# Patient Record
Sex: Male | Born: 1935 | Race: White | Hispanic: No | Marital: Married | Smoking: Former smoker
Health system: Southern US, Community
[De-identification: ages and names within clinical notes are randomized; demographics above are authoritative.]

## PROBLEM LIST (undated history)

## (undated) DIAGNOSIS — T8859XA Other complications of anesthesia, initial encounter: Secondary | ICD-10-CM

## (undated) DIAGNOSIS — I429 Cardiomyopathy, unspecified: Secondary | ICD-10-CM

## (undated) DIAGNOSIS — I509 Heart failure, unspecified: Secondary | ICD-10-CM

## (undated) DIAGNOSIS — I10 Essential (primary) hypertension: Secondary | ICD-10-CM

## (undated) DIAGNOSIS — N35919 Unspecified urethral stricture, male, unspecified site: Secondary | ICD-10-CM

## (undated) DIAGNOSIS — G4730 Sleep apnea, unspecified: Secondary | ICD-10-CM

## (undated) DIAGNOSIS — I499 Cardiac arrhythmia, unspecified: Secondary | ICD-10-CM

## (undated) DIAGNOSIS — C801 Malignant (primary) neoplasm, unspecified: Secondary | ICD-10-CM

## (undated) DIAGNOSIS — T4145XA Adverse effect of unspecified anesthetic, initial encounter: Secondary | ICD-10-CM

## (undated) DIAGNOSIS — I428 Other cardiomyopathies: Secondary | ICD-10-CM

## (undated) DIAGNOSIS — I482 Chronic atrial fibrillation, unspecified: Secondary | ICD-10-CM

## (undated) DIAGNOSIS — I6529 Occlusion and stenosis of unspecified carotid artery: Secondary | ICD-10-CM

## (undated) DIAGNOSIS — D696 Thrombocytopenia, unspecified: Secondary | ICD-10-CM

## (undated) DIAGNOSIS — N400 Enlarged prostate without lower urinary tract symptoms: Secondary | ICD-10-CM

## (undated) DIAGNOSIS — H3500 Unspecified background retinopathy: Secondary | ICD-10-CM

## (undated) DIAGNOSIS — R2689 Other abnormalities of gait and mobility: Secondary | ICD-10-CM

## (undated) DIAGNOSIS — I2510 Atherosclerotic heart disease of native coronary artery without angina pectoris: Secondary | ICD-10-CM

## (undated) DIAGNOSIS — R351 Nocturia: Secondary | ICD-10-CM

## (undated) DIAGNOSIS — R310 Gross hematuria: Secondary | ICD-10-CM

## (undated) DIAGNOSIS — G621 Alcoholic polyneuropathy: Secondary | ICD-10-CM

## (undated) DIAGNOSIS — E785 Hyperlipidemia, unspecified: Secondary | ICD-10-CM

## (undated) DIAGNOSIS — R350 Frequency of micturition: Secondary | ICD-10-CM

## (undated) DIAGNOSIS — R0600 Dyspnea, unspecified: Secondary | ICD-10-CM

## (undated) HISTORY — DX: Alcoholic polyneuropathy: G62.1

## (undated) HISTORY — DX: Chronic atrial fibrillation, unspecified: I48.20

## (undated) HISTORY — DX: Thrombocytopenia, unspecified: D69.6

## (undated) HISTORY — PX: OTHER SURGICAL HISTORY: SHX169

## (undated) HISTORY — DX: Unspecified background retinopathy: H35.00

## (undated) HISTORY — DX: Malignant (primary) neoplasm, unspecified: C80.1

## (undated) HISTORY — PX: PARTIAL NEPHRECTOMY: SHX414

## (undated) HISTORY — DX: Nocturia: R35.1

## (undated) HISTORY — DX: Atherosclerotic heart disease of native coronary artery without angina pectoris: I25.10

## (undated) HISTORY — DX: Gross hematuria: R31.0

## (undated) HISTORY — DX: Unspecified urethral stricture, male, unspecified site: N35.919

## (undated) HISTORY — DX: Benign prostatic hyperplasia without lower urinary tract symptoms: N40.0

## (undated) HISTORY — DX: Occlusion and stenosis of unspecified carotid artery: I65.29

## (undated) HISTORY — DX: Other cardiomyopathies: I42.8

## (undated) HISTORY — DX: Frequency of micturition: R35.0

## (undated) HISTORY — PX: EYE SURGERY: SHX253

## (undated) HISTORY — PX: PROSTATE SURGERY: SHX751

## (undated) HISTORY — DX: Essential (primary) hypertension: I10

## (undated) HISTORY — PX: ROTATOR CUFF REPAIR: SHX139

---

## 2000-09-02 DIAGNOSIS — C801 Malignant (primary) neoplasm, unspecified: Secondary | ICD-10-CM

## 2000-09-02 HISTORY — DX: Malignant (primary) neoplasm, unspecified: C80.1

## 2001-09-02 HISTORY — PX: TRANSURETHRAL RESECTION OF PROSTATE: SHX73

## 2007-09-04 LAB — HM COLONOSCOPY

## 2008-09-13 ENCOUNTER — Encounter: Payer: Self-pay | Admitting: Internal Medicine

## 2008-09-20 ENCOUNTER — Encounter: Payer: Self-pay | Admitting: Internal Medicine

## 2008-12-05 ENCOUNTER — Ambulatory Visit: Payer: Self-pay | Admitting: Internal Medicine

## 2008-12-05 DIAGNOSIS — N400 Enlarged prostate without lower urinary tract symptoms: Secondary | ICD-10-CM | POA: Insufficient documentation

## 2008-12-05 DIAGNOSIS — Z860100 Personal history of colon polyps, unspecified: Secondary | ICD-10-CM | POA: Insufficient documentation

## 2008-12-05 DIAGNOSIS — I4891 Unspecified atrial fibrillation: Secondary | ICD-10-CM | POA: Insufficient documentation

## 2008-12-05 DIAGNOSIS — Z85828 Personal history of other malignant neoplasm of skin: Secondary | ICD-10-CM | POA: Insufficient documentation

## 2008-12-05 DIAGNOSIS — I10 Essential (primary) hypertension: Secondary | ICD-10-CM | POA: Insufficient documentation

## 2008-12-05 DIAGNOSIS — Z8719 Personal history of other diseases of the digestive system: Secondary | ICD-10-CM | POA: Insufficient documentation

## 2009-01-05 ENCOUNTER — Ambulatory Visit: Payer: Self-pay | Admitting: Internal Medicine

## 2009-01-05 DIAGNOSIS — G609 Hereditary and idiopathic neuropathy, unspecified: Secondary | ICD-10-CM | POA: Insufficient documentation

## 2009-01-05 LAB — CONVERTED CEMR LAB
ALT: 29 units/L (ref 0–53)
AST: 27 units/L (ref 0–37)
Albumin: 4.2 g/dL (ref 3.5–5.2)
Eosinophils Relative: 2.6 % (ref 0.0–5.0)
GFR calc non Af Amer: 48.51 mL/min (ref >60)
Glucose, Bld: 92 mg/dL (ref 70–99)
HCT: 44.7 % (ref 39.0–52.0)
Hemoglobin: 15.5 g/dL (ref 13.0–17.0)
Leukocytes, UA: NEGATIVE
Lymphs Abs: 1.4 10*3/uL (ref 0.7–4.0)
Monocytes Relative: 7 % (ref 3.0–12.0)
Neutro Abs: 4.6 10*3/uL (ref 1.4–7.7)
Nitrite: NEGATIVE
Potassium: 4.4 meq/L (ref 3.5–5.1)
RDW: 13 % (ref 11.5–14.6)
Sed Rate: 5 mm/hr (ref 0–22)
Sodium: 143 meq/L (ref 135–145)
Specific Gravity, Urine: 1.015 (ref 1.000–1.030)
TSH: 1.6 microintl units/mL (ref 0.35–5.50)
Total Protein: 6.8 g/dL (ref 6.0–8.3)
WBC: 6.7 10*3/uL (ref 4.5–10.5)
pH: 7 (ref 5.0–8.0)

## 2009-01-06 ENCOUNTER — Encounter: Payer: Self-pay | Admitting: Internal Medicine

## 2009-01-16 ENCOUNTER — Encounter: Payer: Self-pay | Admitting: Internal Medicine

## 2009-01-23 ENCOUNTER — Telehealth: Payer: Self-pay | Admitting: Internal Medicine

## 2009-02-01 ENCOUNTER — Encounter (INDEPENDENT_AMBULATORY_CARE_PROVIDER_SITE_OTHER): Payer: Self-pay | Admitting: *Deleted

## 2009-02-28 ENCOUNTER — Ambulatory Visit: Payer: Self-pay | Admitting: Internal Medicine

## 2009-02-28 LAB — CONVERTED CEMR LAB
ALT: 30 units/L (ref 0–53)
AST: 29 units/L (ref 0–37)
Albumin: 4.1 g/dL (ref 3.5–5.2)
BUN: 30 mg/dL — ABNORMAL HIGH (ref 6–23)
Basophils Relative: 0.5 % (ref 0.0–3.0)
Bilirubin Urine: NEGATIVE
Chloride: 105 meq/L (ref 96–112)
Cholesterol: 124 mg/dL (ref 0–200)
Eosinophils Relative: 3.2 % (ref 0.0–5.0)
Folate: 11.8 ng/mL
Glucose, Bld: 94 mg/dL (ref 70–99)
HCT: 45 % (ref 39.0–52.0)
Hemoglobin, Urine: NEGATIVE
Hemoglobin: 16 g/dL (ref 13.0–17.0)
Ketones, ur: NEGATIVE mg/dL
LDL Cholesterol: 72 mg/dL (ref 0–99)
Leukocytes, UA: NEGATIVE
Lymphs Abs: 1.6 10*3/uL (ref 0.7–4.0)
MCV: 90.9 fL (ref 78.0–100.0)
Monocytes Absolute: 0.4 10*3/uL (ref 0.1–1.0)
Neutro Abs: 3.7 10*3/uL (ref 1.4–7.7)
Nitrite: NEGATIVE
PSA: 0.82 ng/mL (ref 0.10–4.00)
Platelets: 127 10*3/uL — ABNORMAL LOW (ref 150.0–400.0)
Potassium: 4.6 meq/L (ref 3.5–5.1)
Sodium: 142 meq/L (ref 135–145)
Total Bilirubin: 1.4 mg/dL — ABNORMAL HIGH (ref 0.3–1.2)
Total Protein: 7.1 g/dL (ref 6.0–8.3)
Urobilinogen, UA: 0.2 (ref 0.0–1.0)
WBC: 5.9 10*3/uL (ref 4.5–10.5)

## 2009-03-01 ENCOUNTER — Encounter: Payer: Self-pay | Admitting: Internal Medicine

## 2009-03-23 ENCOUNTER — Telehealth: Payer: Self-pay | Admitting: Internal Medicine

## 2009-03-28 ENCOUNTER — Encounter: Payer: Self-pay | Admitting: Internal Medicine

## 2009-05-02 ENCOUNTER — Ambulatory Visit: Payer: Self-pay | Admitting: Internal Medicine

## 2009-05-02 DIAGNOSIS — IMO0002 Reserved for concepts with insufficient information to code with codable children: Secondary | ICD-10-CM | POA: Insufficient documentation

## 2009-05-09 ENCOUNTER — Encounter: Admission: RE | Admit: 2009-05-09 | Discharge: 2009-05-09 | Payer: Self-pay | Admitting: Internal Medicine

## 2009-05-09 ENCOUNTER — Telehealth: Payer: Self-pay | Admitting: Internal Medicine

## 2009-05-09 ENCOUNTER — Encounter: Payer: Self-pay | Admitting: Internal Medicine

## 2009-06-14 ENCOUNTER — Encounter
Admission: RE | Admit: 2009-06-14 | Discharge: 2009-06-20 | Payer: Self-pay | Admitting: Physical Medicine & Rehabilitation

## 2009-06-16 ENCOUNTER — Ambulatory Visit: Payer: Self-pay | Admitting: Physical Medicine & Rehabilitation

## 2009-07-06 ENCOUNTER — Ambulatory Visit: Payer: Self-pay | Admitting: Internal Medicine

## 2009-08-10 ENCOUNTER — Telehealth: Payer: Self-pay | Admitting: Internal Medicine

## 2010-01-05 ENCOUNTER — Ambulatory Visit: Payer: Self-pay | Admitting: Internal Medicine

## 2010-01-05 LAB — CONVERTED CEMR LAB
ALT: 25 units/L (ref 0–53)
AST: 22 units/L (ref 0–37)
Basophils Absolute: 0 10*3/uL (ref 0.0–0.1)
Calcium: 9.6 mg/dL (ref 8.4–10.5)
Chloride: 104 meq/L (ref 96–112)
Eosinophils Relative: 2.6 % (ref 0.0–5.0)
Folate: 13.5 ng/mL
Glucose, Bld: 65 mg/dL — ABNORMAL LOW (ref 70–99)
HCT: 44.1 % (ref 39.0–52.0)
Hemoglobin: 15 g/dL (ref 13.0–17.0)
Lymphs Abs: 1.6 10*3/uL (ref 0.7–4.0)
MCV: 93.1 fL (ref 78.0–100.0)
Monocytes Absolute: 0.5 10*3/uL (ref 0.1–1.0)
Monocytes Relative: 7.8 % (ref 3.0–12.0)
Neutro Abs: 4.4 10*3/uL (ref 1.4–7.7)
Platelets: 121 10*3/uL — ABNORMAL LOW (ref 150.0–400.0)
Potassium: 4.7 meq/L (ref 3.5–5.1)
RDW: 14 % (ref 11.5–14.6)
Sodium: 142 meq/L (ref 135–145)
Total Bilirubin: 0.9 mg/dL (ref 0.3–1.2)
Total Protein: 6.6 g/dL (ref 6.0–8.3)
Vitamin B-12: 344 pg/mL (ref 211–911)

## 2010-01-22 ENCOUNTER — Encounter: Payer: Self-pay | Admitting: Internal Medicine

## 2010-08-03 ENCOUNTER — Ambulatory Visit: Payer: Self-pay | Admitting: Internal Medicine

## 2010-10-02 NOTE — Assessment & Plan Note (Signed)
Summary: 6 MO ROV /NWS #   Vital Signs:  Patient profile:   77 year old male Height:      73 inches Weight:      223 pounds BMI:     29.53 O2 Sat:      98 % on Room air Temp:     97.3 degrees F oral Pulse rate:   51 / minute Pulse rhythm:   regular Resp:     16 per minute BP sitting:   124 / 72  (left arm) Cuff size:   large  Vitals Entered By: Rock Nephew CMA (Jan 05, 2010 9:23 AM)  Nutrition Counseling: Patient's BMI is greater than 25 and therefore counseled on weight management options.  O2 Flow:  Room air CC: follow-up visit, Hypertension Management Is Patient Diabetic? No Pain Assessment Patient in pain? no        Primary Care Provider:  Etta Grandchild MD  CC:  follow-up visit and Hypertension Management.  History of Present Illness: He remains concerned about N/T in his feet and he wants to do another NCS/EMG to see if his nerve function has changed any in the last year. He still drinks but says it is 1/2 of what he previously drank.  Hypertension History:      He denies headache, chest pain, palpitations, dyspnea with exertion, orthopnea, PND, peripheral edema, visual symptoms, neurologic problems, syncope, and side effects from treatment.  He notes no problems with any antihypertensive medication side effects.        Positive major cardiovascular risk factors include male age 75 years old or older, hyperlipidemia, and hypertension.  Negative major cardiovascular risk factors include no history of diabetes, negative family history for ischemic heart disease, and non-tobacco-user status.        Positive history for target organ damage include cardiac end organ damage (either CHF or LVH).  Further assessment for target organ damage reveals no history of ASHD, stroke/TIA, peripheral vascular disease, renal insufficiency, or hypertensive retinopathy.     Preventive Screening-Counseling & Management  Alcohol-Tobacco     Alcohol drinks/day: <1     Alcohol type:  spirits     >5/day in last 3 mos: no     Alcohol Counseling: to STOP drinking     Feels need to cut down: yes     Feels annoyed by complaints: yes     Feels guilty re: drinking: no     Needs 'eye opener' in am: no     Smoking Status: never  Hep-HIV-STD-Contraception     Hepatitis Risk: no risk noted     HIV Risk: no risk noted     STD Risk: no risk noted      Sexual History:  currently monogamous.        Drug Use:  never.        Blood Transfusions:  no.    Clinical Review Panels:  Prevention   Last Colonoscopy:  Adenomatous Polyp (08/31/2007)   Last PSA:  0.82 (02/28/2009)  Immunizations   Last Tetanus Booster:  Td (12/16/2001)   Last Pneumovax:  Pneumovax (12/05/2004)  Lipid Management   Cholesterol:  124 (02/28/2009)   LDL (bad choesterol):  72 (02/28/2009)   HDL (good cholesterol):  37.00 (02/28/2009)  Diabetes Management   HgBA1C:  5.4 (01/05/2009)   Creatinine:  1.3 (02/28/2009)   Last Pneumovax:  Pneumovax (12/05/2004)  CBC   WBC:  5.9 (02/28/2009)   RBC:  4.95 (02/28/2009)   Hgb:  16.0 (02/28/2009)   Hct:  45.0 (02/28/2009)   Platelets:  127.0 (02/28/2009)   MCV  90.9 (02/28/2009)   MCHC  35.5 (02/28/2009)   RDW  12.5 (02/28/2009)   PMN:  63.7 (02/28/2009)   Lymphs:  26.6 (02/28/2009)   Monos:  6.0 (02/28/2009)   Eosinophils:  3.2 (02/28/2009)   Basophil:  0.5 (02/28/2009)  Complete Metabolic Panel   Glucose:  94 (02/28/2009)   Sodium:  142 (02/28/2009)   Potassium:  4.6 (02/28/2009)   Chloride:  105 (02/28/2009)   CO2:  31 (02/28/2009)   BUN:  30 (02/28/2009)   Creatinine:  1.3 (02/28/2009)   Albumin:  4.1 (02/28/2009)   Total Protein:  7.1 (02/28/2009)   Calcium:  9.5 (02/28/2009)   Total Bili:  1.4 (02/28/2009)   Alk Phos:  63 (02/28/2009)   SGPT (ALT):  30 (02/28/2009)   SGOT (AST):  29 (02/28/2009)   Medications Prior to Update: 1)  Digoxin 0.125 Mg Tabs (Digoxin) 2)  Toprol Xl 50 Mg Xr24h-Tab (Metoprolol Succinate) .... Take 1  Tablet By Mouth Once A Day 3)  Warfarin Sodium 2.5 Mg Tabs (Warfarin Sodium) .... 2.5mg  5 X Weekly and 5mg  2 X Weekly 4)  Simvastatin 80 Mg Tabs (Simvastatin) .... Take 1 Tablet By Mouth Once A Day 5)  Glucosamine-Chondroitin 1500-1200 Mg/90ml Liqd (Glucosamine-Chondroitin) .... 2 Qd 6)  Hyzaar 100-12.5 Mg Tabs (Losartan Potassium-Hctz) .... Once Daily For High Blood Pressure  Current Medications (verified): 1)  Digoxin 0.125 Mg Tabs (Digoxin) 2)  Toprol Xl 50 Mg Xr24h-Tab (Metoprolol Succinate) .... Take 1 Tablet By Mouth Once A Day 3)  Warfarin Sodium 2.5 Mg Tabs (Warfarin Sodium) .... 2.5mg  5 X Weekly and 5mg  2 X Weekly 4)  Simvastatin 80 Mg Tabs (Simvastatin) .... Take 1 Tablet By Mouth Once A Day 5)  Hyzaar 100-12.5 Mg Tabs (Losartan Potassium-Hctz) .... Once Daily For High Blood Pressure  Allergies (verified): 1)  ! Ace Inhibitors  Past History:  Past Medical History: Reviewed history from 12/05/2008 and no changes required. Anticoagulation therapy Atrial fibrillation Skin cancer, hx of Hypertension Benign prostatic hypertrophy Colonic polyps, hx of Diverticulitis, hx of  Past Surgical History: Reviewed history from 12/05/2008 and no changes required. Nephrectomy- left/partial 2002 Transurethral resection of prostate- 2003 Tonsillectomy-1940  Family History: Reviewed history from 12/05/2008 and no changes required. Family History Diabetes 1st degree relative Family History Hypertension Family History of Cardiovascular disorder  Social History: Reviewed history from 02/28/2009 and no changes required. Retired Married Drug use-no Regular exercise-yes Alcohol use-yes  Review of Systems  The patient denies vision loss, abdominal pain, melena, hematochezia, severe indigestion/heartburn, hematuria, difficulty walking, and angioedema.   CV:  Denies chest pain or discomfort, difficulty breathing at night, fainting, fatigue, lightheadness, near fainting, palpitations,  shortness of breath with exertion, swelling of feet, and weight gain. Neuro:  Complains of numbness and tingling; denies brief paralysis, difficulty with concentration, disturbances in coordination, falling down, headaches, poor balance, seizures, and weakness.  Physical Exam  General:  alert, well-developed, well-nourished, and well-hydrated.   Head:  Normocephalic and atraumatic without obvious abnormalities. No apparent alopecia or balding. Eyes:  No corneal or conjunctival inflammation noted. EOMI. Perrla. Funduscopic exam benign, without hemorrhages, exudates or papilledema. Vision grossly normal. Mouth:  Oral mucosa and oropharynx without lesions or exudates.  Teeth in good repair. Neck:  supple, full ROM, no masses, and no thyromegaly.   Lungs:  Normal respiratory effort, chest expands symmetrically. Lungs are clear to auscultation, no crackles or wheezes.  Heart:  no murmur, no gallop, no rub, and irregular rhythm.   Abdomen:  soft, non-tender, normal bowel sounds, no distention, no masses, no guarding, no rigidity, no hepatomegaly, and no splenomegaly.   Msk:  normal ROM, no joint tenderness, no joint swelling, no joint warmth, no redness over joints, no joint deformities, no joint instability, and no crepitation.   Pulses:  R and L carotid,radial,femoral,dorsalis pedis and posterior tibial pulses are full and equal bilaterally Extremities:  trace left pedal edema and trace right pedal edema.   Neurologic:  his gait shows that his feet rotate externally  but he is not ataxic.alert & oriented X3, cranial nerves II-XII intact, strength normal in all extremities, RLE hyporeflexia, RLE sensory loss, LLE hyporeflexia, LLE sensory loss, decreased sensation to PP, and decreased sensation to LT.   Skin:  turgor normal, color normal, no rashes, no suspicious lesions, no ecchymoses, no petechiae, no purpura, excessive tan, and solar damage.   Cervical Nodes:  no anterior cervical adenopathy and no  posterior cervical adenopathy.   Axillary Nodes:  no R axillary adenopathy and no L axillary adenopathy.   Psych:  Cognition and judgment appear intact. Alert and cooperative with normal attention span and concentration. No apparent delusions, illusions, hallucinations   Impression & Recommendations:  Problem # 1:  PERIPHERAL NEUROPATHY (ICD-356.9) Assessment Unchanged I still believe his EtOH intake is a big problem with this but he is NOT willing to abstain from alcohol intake. Orders: Venipuncture (16109) TLB-BMP (Basic Metabolic Panel-BMET) (80048-METABOL) TLB-CBC Platelet - w/Differential (85025-CBCD) TLB-Hepatic/Liver Function Pnl (80076-HEPATIC) TLB-B12 + Folate Pnl (60454_09811-B14/NWG) TLB-TSH (Thyroid Stimulating Hormone) (84443-TSH) TLB-Digoxin (Lanoxin) (80162-DIG) Neurology Referral (Neuro)  Problem # 2:  HYPERTENSION (ICD-401.9) Assessment: Improved  His updated medication list for this problem includes:    Toprol Xl 50 Mg Xr24h-tab (Metoprolol succinate) .Marland Kitchen... Take 1 tablet by mouth once a day    Hyzaar 100-12.5 Mg Tabs (Losartan potassium-hctz) ..... Once daily for high blood pressure  Orders: Venipuncture (95621) TLB-BMP (Basic Metabolic Panel-BMET) (80048-METABOL) TLB-CBC Platelet - w/Differential (85025-CBCD) TLB-Hepatic/Liver Function Pnl (80076-HEPATIC) TLB-B12 + Folate Pnl (30865_78469-G29/BMW) TLB-TSH (Thyroid Stimulating Hormone) (84443-TSH) TLB-Digoxin (Lanoxin) (80162-DIG)  BP today: 124/72 Prior BP: 118/82 (07/06/2009)  Prior 10 Yr Risk Heart Disease: 11 % (05/02/2009)  Labs Reviewed: K+: 4.6 (02/28/2009) Creat: : 1.3 (02/28/2009)   Chol: 124 (02/28/2009)   HDL: 37.00 (02/28/2009)   LDL: 72 (02/28/2009)   TG: 77.0 (02/28/2009)  Problem # 3:  ATRIAL FIBRILLATION (ICD-427.31) Assessment: Unchanged  His updated medication list for this problem includes:    Digoxin 0.125 Mg Tabs (Digoxin)    Toprol Xl 50 Mg Xr24h-tab (Metoprolol succinate)  .Marland Kitchen... Take 1 tablet by mouth once a day    Warfarin Sodium 2.5 Mg Tabs (Warfarin sodium) .Marland Kitchen... 2.5mg  5 x weekly and 5mg  2 x weekly  Orders: Venipuncture (41324) TLB-BMP (Basic Metabolic Panel-BMET) (80048-METABOL) TLB-CBC Platelet - w/Differential (85025-CBCD) TLB-Hepatic/Liver Function Pnl (80076-HEPATIC) TLB-B12 + Folate Pnl (40102_72536-U44/IHK) TLB-TSH (Thyroid Stimulating Hormone) (84443-TSH) TLB-Digoxin (Lanoxin) (80162-DIG)  Complete Medication List: 1)  Digoxin 0.125 Mg Tabs (Digoxin) 2)  Toprol Xl 50 Mg Xr24h-tab (Metoprolol succinate) .... Take 1 tablet by mouth once a day 3)  Warfarin Sodium 2.5 Mg Tabs (Warfarin sodium) .... 2.5mg  5 x weekly and 5mg  2 x weekly 4)  Simvastatin 80 Mg Tabs (Simvastatin) .... Take 1 tablet by mouth once a day 5)  Hyzaar 100-12.5 Mg Tabs (Losartan potassium-hctz) .... Once daily for high blood pressure  Hypertension Assessment/Plan:  The patient's hypertensive risk group is category C: Target organ damage and/or diabetes.  His calculated 10 year risk of coronary heart disease is 11 %.  Today's blood pressure is 124/72.  His blood pressure goal is < 140/90.  Patient Instructions: 1)  Please schedule a follow-up appointment in 6 months. 2)  It is important that you exercise regularly at least 20 minutes 5 times a week. If you develop chest pain, have severe difficulty breathing, or feel very tired , stop exercising immediately and seek medical attention. 3)  You need to lose weight. Consider a lower calorie diet and regular exercise.  4)  Take 650-1000mg  of Tylenol every 4-6 hours as needed for relief of pain or comfort of fever AVOID taking more than 4000mg   in a 24 hour period (can cause liver damage in higher doses).   Not Administered:    Influenza Vaccine not given due to: vaccine availability

## 2011-02-12 ENCOUNTER — Encounter: Payer: Self-pay | Admitting: Internal Medicine

## 2011-02-26 LAB — PROTIME-INR

## 2011-03-03 ENCOUNTER — Encounter: Payer: Self-pay | Admitting: Internal Medicine

## 2011-03-20 ENCOUNTER — Ambulatory Visit: Payer: Self-pay | Admitting: Internal Medicine

## 2011-04-03 ENCOUNTER — Encounter: Payer: Self-pay | Admitting: Internal Medicine

## 2011-04-11 ENCOUNTER — Encounter: Payer: Self-pay | Admitting: Internal Medicine

## 2011-04-22 ENCOUNTER — Telehealth: Payer: Self-pay | Admitting: Internal Medicine

## 2011-04-22 ENCOUNTER — Ambulatory Visit (INDEPENDENT_AMBULATORY_CARE_PROVIDER_SITE_OTHER): Payer: Medicare Other | Admitting: Internal Medicine

## 2011-04-22 DIAGNOSIS — I4891 Unspecified atrial fibrillation: Secondary | ICD-10-CM

## 2011-04-22 LAB — PROTIME-INR: Prothrombin Time: 22.5 seconds — ABNORMAL HIGH (ref 11.6–15.2)

## 2011-04-22 MED ORDER — WARFARIN SODIUM 2.5 MG PO TABS: ORAL_TABLET | ORAL | Status: DC

## 2011-04-22 NOTE — Telephone Encounter (Signed)
Patient is requesting refill, printed Rx for patient pick-up.  Pending signature.

## 2011-04-22 NOTE — Telephone Encounter (Signed)
Patient notified, and will pick up Rx.

## 2011-04-23 NOTE — Progress Notes (Signed)
  Subjective:    Patient ID: Kyle Gutierrez, male    DOB: Jul 08, 1932, 77 y.o.   MRN: [id number]  HPI Patient is here for a PT/INR check (lab draw only).   Review of Systems     Objective:   Physical Exam        Assessment & Plan:

## 2011-04-24 ENCOUNTER — Other Ambulatory Visit: Payer: Self-pay | Admitting: Internal Medicine

## 2011-04-24 MED ORDER — METOPROLOL SUCCINATE ER 25 MG PO TB24: 25.0000 mg | ORAL_TABLET | Freq: Every day | ORAL | Status: DC

## 2011-05-21 ENCOUNTER — Other Ambulatory Visit (INDEPENDENT_AMBULATORY_CARE_PROVIDER_SITE_OTHER): Payer: Medicare Other | Admitting: *Deleted

## 2011-05-21 DIAGNOSIS — Z7901 Long term (current) use of anticoagulants: Secondary | ICD-10-CM

## 2011-05-21 LAB — PROTIME-INR: INR: 1.9 ratio — ABNORMAL HIGH (ref 0.8–1.0)

## 2011-05-21 NOTE — Progress Notes (Signed)
Addended by: Jobie Quaker on: 05/21/2011 02:09 PM   Modules accepted: Orders

## 2011-06-18 ENCOUNTER — Ambulatory Visit (INDEPENDENT_AMBULATORY_CARE_PROVIDER_SITE_OTHER): Payer: Medicare Other | Admitting: *Deleted

## 2011-06-18 ENCOUNTER — Other Ambulatory Visit (INDEPENDENT_AMBULATORY_CARE_PROVIDER_SITE_OTHER): Payer: Medicare Other | Admitting: *Deleted

## 2011-06-18 DIAGNOSIS — Z23 Encounter for immunization: Secondary | ICD-10-CM

## 2011-06-18 DIAGNOSIS — Z7901 Long term (current) use of anticoagulants: Secondary | ICD-10-CM

## 2011-06-18 NOTE — Progress Notes (Signed)
Addended by: Jobie Quaker on: 06/18/2011 04:22 PM   Modules accepted: Orders

## 2011-06-19 LAB — PROTIME-INR
INR: 1.7 — ABNORMAL HIGH (ref <1.50)
Prothrombin Time: 20.6 seconds — ABNORMAL HIGH (ref 11.6–15.2)

## 2011-07-16 ENCOUNTER — Telehealth: Payer: Self-pay | Admitting: Internal Medicine

## 2011-07-16 ENCOUNTER — Other Ambulatory Visit (INDEPENDENT_AMBULATORY_CARE_PROVIDER_SITE_OTHER): Payer: Medicare Other | Admitting: *Deleted

## 2011-07-16 DIAGNOSIS — Z7901 Long term (current) use of anticoagulants: Secondary | ICD-10-CM

## 2011-07-16 LAB — PROTIME-INR
INR: 2.3 ratio — ABNORMAL HIGH (ref 0.8–1.0)
Prothrombin Time: 25.9 s — ABNORMAL HIGH (ref 10.2–12.4)

## 2011-07-17 NOTE — Telephone Encounter (Signed)
Standing order printed for Dr. Darrick Huntsman signature. Will call patient to let him know that it has been done once she signs it. Also need to ask him which [facility] this will be going to.

## 2011-08-06 ENCOUNTER — Ambulatory Visit (INDEPENDENT_AMBULATORY_CARE_PROVIDER_SITE_OTHER): Payer: Medicare Other | Admitting: Internal Medicine

## 2011-08-06 ENCOUNTER — Encounter: Payer: Self-pay | Admitting: Internal Medicine

## 2011-08-06 DIAGNOSIS — E785 Hyperlipidemia, unspecified: Secondary | ICD-10-CM

## 2011-08-06 DIAGNOSIS — Z79899 Other long term (current) drug therapy: Secondary | ICD-10-CM

## 2011-08-06 DIAGNOSIS — Z7901 Long term (current) use of anticoagulants: Secondary | ICD-10-CM

## 2011-08-06 DIAGNOSIS — D696 Thrombocytopenia, unspecified: Secondary | ICD-10-CM

## 2011-08-06 DIAGNOSIS — I739 Peripheral vascular disease, unspecified: Secondary | ICD-10-CM | POA: Insufficient documentation

## 2011-08-06 DIAGNOSIS — G609 Hereditary and idiopathic neuropathy, unspecified: Secondary | ICD-10-CM

## 2011-08-06 DIAGNOSIS — A5211 Tabes dorsalis: Secondary | ICD-10-CM

## 2011-08-06 DIAGNOSIS — I10 Essential (primary) hypertension: Secondary | ICD-10-CM

## 2011-08-06 DIAGNOSIS — G589 Mononeuropathy, unspecified: Secondary | ICD-10-CM

## 2011-08-06 DIAGNOSIS — Z23 Encounter for immunization: Secondary | ICD-10-CM

## 2011-08-06 DIAGNOSIS — G629 Polyneuropathy, unspecified: Secondary | ICD-10-CM

## 2011-08-06 LAB — COMPREHENSIVE METABOLIC PANEL
Albumin: 4.1 g/dL (ref 3.5–5.2)
Alkaline Phosphatase: 64 U/L (ref 39–117)
BUN: 27 mg/dL — ABNORMAL HIGH (ref 6–23)
CO2: 28 mEq/L (ref 19–32)
Calcium: 9.3 mg/dL (ref 8.4–10.5)
Chloride: 109 mEq/L (ref 96–112)
GFR: 53.49 mL/min — ABNORMAL LOW (ref >60.00)
Glucose, Bld: 99 mg/dL (ref 70–99)
Potassium: 4.1 mEq/L (ref 3.5–5.1)
Sodium: 145 mEq/L (ref 135–145)
Total Protein: 6.6 g/dL (ref 6.0–8.3)

## 2011-08-06 LAB — CBC WITH DIFFERENTIAL/PLATELET
Eosinophils Relative: 2.6 % (ref 0.0–5.0)
Lymphocytes Relative: 22.1 % (ref 12.0–46.0)
MCV: 94.1 fl (ref 78.0–100.0)
Monocytes Absolute: 0.4 10*3/uL (ref 0.1–1.0)
Neutrophils Relative %: 67.3 % (ref 43.0–77.0)
Platelets: 111 10*3/uL — ABNORMAL LOW (ref 150.0–400.0)
RBC: 4.5 Mil/uL (ref 4.22–5.81)
WBC: 5.8 10*3/uL (ref 4.5–10.5)

## 2011-08-06 LAB — PROTIME-INR
INR: 2.5 ratio — ABNORMAL HIGH (ref 0.8–1.0)
Prothrombin Time: 27.5 s — ABNORMAL HIGH (ref 10.2–12.4)

## 2011-08-06 LAB — LIPID PANEL: Cholesterol: 142 mg/dL (ref 0–200)

## 2011-08-06 NOTE — Progress Notes (Signed)
  Subjective:    Patient ID: Kyle Gutierrez, male    DOB: November 09, 1931, 77 y.o.   MRN: [id number]  HPI 77 yo white male with history of atrial fibrillation. Seen recently by Dr. Gwen Pounds  And told he had CAD. Patient upset that he had not been told this before.  No history of AMI, no prior cardiac cath.  After extended discussion it seems that carotid dopplers were done in office and noted <50% stenosis  And the conclusion was drawn that he had CAD without explaining to patient the rationale for making this assumption.  Patient denies chest pain, claudication, exertional, dyspnea.  Had ECHO done .      Review of Systems  Constitutional: Negative for fever, chills, diaphoresis, activity change, appetite change, fatigue and unexpected weight change.  HENT: Negative for hearing loss, ear pain, nosebleeds, congestion, sore throat, facial swelling, rhinorrhea, sneezing, drooling, mouth sores, trouble swallowing, neck pain, neck stiffness, dental problem, voice change, postnasal drip, sinus pressure, tinnitus and ear discharge.   Eyes: Negative for photophobia, pain, discharge, redness, itching and visual disturbance.  Respiratory: Negative for apnea, cough, choking, chest tightness, shortness of breath, wheezing and stridor.   Cardiovascular: Negative for chest pain, palpitations and leg swelling.  Gastrointestinal: Negative for nausea, vomiting, abdominal pain, diarrhea, constipation, blood in stool, abdominal distention, anal bleeding and rectal pain.  Genitourinary: Negative for dysuria, urgency, frequency, hematuria, flank pain, decreased urine volume, scrotal swelling, difficulty urinating and testicular pain.  Musculoskeletal: Negative for myalgias, back pain, joint swelling, arthralgias and gait problem.  Skin: Negative for color change, rash and wound.  Neurological: Negative for dizziness, tremors, seizures, syncope, speech difficulty, weakness, light-headedness, numbness and headaches.    Psychiatric/Behavioral: Negative for suicidal ideas, hallucinations, behavioral problems, confusion, sleep disturbance, dysphoric mood, decreased concentration and agitation. The patient is not nervous/anxious.        Objective:   Physical Exam  Constitutional: He is oriented to person, place, and time.  HENT:  Head: Normocephalic and atraumatic.  Mouth/Throat: Oropharynx is clear and moist.  Eyes: Conjunctivae and EOM are normal.  Neck: Normal range of motion. Neck supple. No JVD present. No thyromegaly present.  Cardiovascular: Normal rate, regular rhythm and normal heart sounds.   Pulmonary/Chest: Effort normal and breath sounds normal. He has no wheezes. He has no rales.  Abdominal: Soft. Bowel sounds are normal. He exhibits no mass. There is no tenderness. There is no rebound.  Musculoskeletal: Normal range of motion. He exhibits no edema.  Neurological: He is alert and oriented to person, place, and time.  Skin: Skin is warm and dry.  Psychiatric: He has a normal mood and affect.          Assessment & Plan:

## 2011-08-06 NOTE — Patient Instructions (Addendum)
Peripheral Vascular Disease  Peripheral Vascular Disease (PVD), also called Peripheral Arterial Disease (PAD), is a circulation problem caused by cholesterol (atherosclerotic plaque) deposits in the arteries. PVD commonly occurs in the lower extremities (legs) but it can occur in other areas of the body, such as your arms. The cholesterol buildup in the arteries reduces blood flow which can cause pain and other serious problems. The presence of PVD can place a person at risk for Coronary Artery Disease (CAD).   CAUSES   Causes of PVD can be many. It is usually associated with more than one risk factor such as:    High Cholesterol.   Smoking.   Diabetes.   Lack of exercise or inactivity.   High blood pressure (hypertension).   Obesity.   Family history.  SYMPTOMS    When the lower extremities are affected, patients with PVD may experience:   Leg pain with exertion or physical activity. This is called INTERMITTENT CLAUDICATION. This may present as cramping or numbness with physical activity. The location of the pain is associated with the level of blockage. For example, blockage at the abdominal level (distal abdominal aorta) may result in buttock or hip pain. Lower leg arterial blockage may result in calf pain.   As PVD becomes more severe, pain can develop with less physical activity.   In people with severe PVD, leg pain may occur at rest.   Other PVD signs and symptoms:   Leg numbness or weakness.   Coldness in the affected leg or foot, especially when compared to the other leg.   A change in leg color.   Patients with significant PVD are more prone to ulcers or sores on toes, feet or legs. These may take longer to heal or may reoccur. The ulcers or sores can become infected.   If signs and symptoms of PVD are ignored, gangrene may occur. This can result in the loss of toes or loss of an entire limb.   Not all leg pain is related to PVD. Other medical conditions can cause leg pain such  as:   Blood clots (embolism) or Deep Vein Thrombosis.   Inflammation of the blood vessels (vasculitis).   Spinal stenosis.  DIAGNOSIS   Diagnosis of PVD can involve several different types of tests. These can include:   Pulse Volume Recording Method (PVR). This test is simple, painless and does not involve the use of X-rays. PVR involves measuring and comparing the blood pressure in the arms and legs. An ABI (Ankle-Brachial Index) is calculated. The normal ratio of blood pressures is 1. As this number becomes smaller, it indicates more severe disease.   < 0.95 - indicates significant narrowing in one or more leg vessels.   <0.8 - there will usually be pain in the foot, leg or buttock with exercise.   <0.4 - will usually have pain in the legs at rest.   <0.25 - usually indicates limb threatening PVD.   Doppler detection of pulses in the legs. This test is painless and checks to see if you have a pulses in your legs/feet.   A dye or contrast material (a substance that highlights the blood vessels so they show up on x-ray) may be given to help your caregiver better see the arteries for the following tests. The dye is eliminated from your body by the kidney's. Your caregiver may order blood work to check your kidney function and other laboratory values before the following tests are performed:   Magnetic Resonance   study of the blood vessels and arteries. The MRA machine uses a large magnet to produce images of the blood vessels.   Computed Tomography Angiography (CTA). A CTA is a specialized x-ray that looks at how the blood flows in your blood vessels. An IV may be inserted into your arm so contrast dye can be injected.   Angiogram. Is a procedure that uses x-rays to look at your blood vessels. This procedure is minimally invasive, meaning a small incision (cut) is made in your groin. A small tube (catheter) is then inserted into the artery of  your groin. The catheter is guided to the blood vessel or artery your caregiver wants to examine. Contrast dye is injected into the catheter. X-rays are then taken of the blood vessel or artery. After the images are obtained, the catheter is taken out.  TREATMENT  Treatment of PVD involves many interventions which may include:  Lifestyle changes:   Quitting smoking.   Exercise.   Following a low fat, low cholesterol diet.   Control of diabetes.   Foot care is very important to the PVD patient. Good foot care can help prevent infection.   Medication:   Cholesterol-lowering medicine.   Blood pressure medicine.   Anti-platelet drugs.   Certain medicines may reduce symptoms of Intermittent Claudication.   Interventional/Surgical options:   Angioplasty. An Angioplasty is a procedure that inflates a balloon in the blocked artery. This opens the blocked artery to improve blood flow.   Stent Implant. A wire mesh tube (stent) is placed in the artery. The stent expands and stays in place, allowing the artery to remain open.   Peripheral Bypass Surgery. This is a surgical procedure that reroutes the blood around a blocked artery to help improve blood flow. This type of procedure may be performed if Angioplasty or stent implants are not an option.  SEEK IMMEDIATE MEDICAL CARE IF:   You develop pain or numbness in your arms or legs.   Your arm or leg turns cold, becomes blue in color.   You develop redness, warmth, swelling and pain in your arms or legs.  MAKE SURE YOU:   Understand these instructions.   Will watch your condition.   Will get help right away if you are not doing well or get worse.  Document Released: 09/26/2004 Document Revised: 05/01/2011 Document Reviewed: 08/23/2008 [facility] Patient Information 2012 [place], [place].

## 2011-08-07 ENCOUNTER — Encounter: Payer: Self-pay | Admitting: Internal Medicine

## 2011-08-07 DIAGNOSIS — F1910 Other psychoactive substance abuse, uncomplicated: Secondary | ICD-10-CM | POA: Insufficient documentation

## 2011-08-07 DIAGNOSIS — I10 Essential (primary) hypertension: Secondary | ICD-10-CM | POA: Insufficient documentation

## 2011-08-07 DIAGNOSIS — G621 Alcoholic polyneuropathy: Secondary | ICD-10-CM | POA: Insufficient documentation

## 2011-08-07 DIAGNOSIS — D696 Thrombocytopenia, unspecified: Secondary | ICD-10-CM | POA: Insufficient documentation

## 2011-08-07 NOTE — Assessment & Plan Note (Addendum)
Apparently found with carotid dopplers done in [facility] office.  Questions answsered. Reports requested.  cholestlerol is under excellent control and he is anticoagulated with coumadin for atrial fib.

## 2011-08-07 NOTE — Assessment & Plan Note (Signed)
Controlled on current regimen. No changes today

## 2011-08-07 NOTE — Assessment & Plan Note (Signed)
Chronic, with slow progression.  Prior neurologic evaluation opined taht it was due to alcohol overuse.

## 2011-08-07 NOTE — Assessment & Plan Note (Signed)
Chronic, stable. No further workup per patient

## 2011-08-08 ENCOUNTER — Telehealth: Payer: Self-pay | Admitting: Internal Medicine

## 2011-08-08 ENCOUNTER — Other Ambulatory Visit: Payer: Self-pay | Admitting: Internal Medicine

## 2011-08-08 DIAGNOSIS — J069 Acute upper respiratory infection, unspecified: Secondary | ICD-10-CM

## 2011-08-08 MED ORDER — DOXYCYCLINE HYCLATE 100 MG PO TABS: 100.0000 mg | ORAL_TABLET | Freq: Two times a day (BID) | ORAL | Status: AC

## 2011-08-08 MED ORDER — ZILEUTON ER 600 MG PO TB12: 600.0000 mg | ORAL_TABLET | Freq: Two times a day (BID) | ORAL | Status: DC

## 2011-08-08 NOTE — Telephone Encounter (Signed)
Pt called wanted to be seen today  He has congestion/sore throat coughing/no fever   [facility]

## 2011-08-08 NOTE — Telephone Encounter (Signed)
Patient called back and he was very upset that he hasn't gotten a call back in regards to the previous phone note. I explained to him that Morrie Sheldon and Dr. Darrick Huntsman have been in clinic all day, I offered him a 10:30 appointment for in the morning, however he wanted to know if he really needed to waste Dr. Melina Schools time by coming in if she could just call him in something. He will be calling us back at 4:45 to check on this status.  Please advise.

## 2011-08-08 NOTE — Telephone Encounter (Signed)
He was just seen on the 4th and had the same symptoms,  He does not need to be seen again.  Since he has not imporved, Please call him in doxycyline 100 mg twice daily with food #14 .   Tessalon 200 mg one tablet every 8 hours prn cough  #45

## 2011-08-09 NOTE — Telephone Encounter (Signed)
Dr. Darrick Huntsman has already spoken to patient

## 2011-08-13 ENCOUNTER — Ambulatory Visit: Payer: Self-pay | Admitting: Urology

## 2011-08-16 ENCOUNTER — Telehealth: Payer: Self-pay | Admitting: Internal Medicine

## 2011-08-16 DIAGNOSIS — R911 Solitary pulmonary nodule: Secondary | ICD-10-CM | POA: Insufficient documentation

## 2011-08-16 NOTE — Telephone Encounter (Signed)
Please tell patient that I have seen the results of his CT scan and no preference of either [facility] or [facility], because [facility] has an excellent cancer center.  If he wants to go to a tertiary care center I am happy to refer him to either one .

## 2011-08-16 NOTE — Telephone Encounter (Signed)
[phone] Pt walked in wanting to talk to you about his ct results he had done @ [facility] for dr Lonna Cobb.  Dr Heywood Footman office has made him an appointment with the [facility]  Cancer center 08/23/11. He wanted to get your input on wheither he should go to [facility] or [facility].  I made a copy of those reports for you

## 2011-08-19 NOTE — Telephone Encounter (Signed)
Pt has appointment at [facility] on Friday, pt states he will either go to [facility] or [facility] and wonders if he should keep appointment at [facility] on Friday. Pt states he will need referral to go strait to [facility]

## 2011-08-19 NOTE — Telephone Encounter (Signed)
Patient notified as instructed by telephone. 

## 2011-08-19 NOTE — Telephone Encounter (Signed)
I would keep the appt at [facility] because it is doubtful that I can get an appt that quickly at [facility]

## 2011-08-19 NOTE — Telephone Encounter (Signed)
Left message on machine to call back  

## 2011-08-22 ENCOUNTER — Ambulatory Visit: Payer: Self-pay | Admitting: Oncology

## 2011-08-23 ENCOUNTER — Telehealth: Payer: Self-pay | Admitting: *Deleted

## 2011-08-23 ENCOUNTER — Ambulatory Visit (INDEPENDENT_AMBULATORY_CARE_PROVIDER_SITE_OTHER): Payer: Medicare Other | Admitting: Internal Medicine

## 2011-08-23 ENCOUNTER — Ambulatory Visit (INDEPENDENT_AMBULATORY_CARE_PROVIDER_SITE_OTHER)
Admission: RE | Admit: 2011-08-23 | Discharge: 2011-08-23 | Disposition: A | Discharge status: Home / Self Care | Payer: Medicare Other | Source: Ambulatory Visit | Attending: Internal Medicine | Admitting: Internal Medicine

## 2011-08-23 VITALS — BP 152/82 | HR 73 | Temp 98.1°F | Ht 74.0 in | Wt 197.0 lb

## 2011-08-23 DIAGNOSIS — R6883 Chills (without fever): Secondary | ICD-10-CM

## 2011-08-23 DIAGNOSIS — R059 Cough, unspecified: Secondary | ICD-10-CM

## 2011-08-23 DIAGNOSIS — J069 Acute upper respiratory infection, unspecified: Secondary | ICD-10-CM

## 2011-08-23 DIAGNOSIS — J40 Bronchitis, not specified as acute or chronic: Secondary | ICD-10-CM

## 2011-08-23 DIAGNOSIS — R05 Cough: Secondary | ICD-10-CM

## 2011-08-23 LAB — POCT INFLUENZA A/B: Influenza A, POC: NEGATIVE

## 2011-08-23 NOTE — Telephone Encounter (Signed)
Patient called back and is going to see Dr. Dan Humphreys today.

## 2011-08-23 NOTE — Progress Notes (Signed)
Subjective:    Patient ID: Kyle Gutierrez, male    DOB: 11/12/1931, 77 y.o.   MRN: [id number]  HPI 76YO male with h/o atrial fibrillation presents with approximate 3 day h/o nasal congestion, cough, chills. Cough is non-productive. No fever. Denies any shortness of breath or chest pain.  Was seen at urgent care yesterday, started on Levaquin and codeine for cough. Reports not feeling any better this morning and concerned about whether or not treatment was appropriate.  Outpatient Encounter Prescriptions as of 08/23/2011  Medication Sig Dispense Refill  . digoxin (LANOXIN) 0.125 MG tablet Take 125 mcg by mouth daily.        Marland Kitchen losartan-hydrochlorothiazide (HYZAAR) 100-12.5 MG per tablet Take 1 tablet by mouth daily.        . metoprolol succinate (TOPROL XL) 25 MG 24 hr tablet Take 1 tablet (25 mg total) by mouth daily.  90 tablet  3  . simvastatin (ZOCOR) 40 MG tablet Take 40 mg by mouth at bedtime.        Marland Kitchen warfarin (COUMADIN) 2.5 MG tablet Take two tablets (5 mg) on Sun, Mon, Wed, Fri.  One tablet (2.5 mg) on Tues, Thurs, Sat.  Or as directed.       . zileuton (ZYFLO CR) 600 MG CR tablet Take 1 tablet (600 mg total) by mouth 2 (two) times daily.  40 tablet  1    Review of Systems  Constitutional: Positive for chills. Negative for fever, activity change and fatigue.  HENT: Positive for congestion, rhinorrhea, postnasal drip and sinus pressure. Negative for hearing loss, ear pain, nosebleeds, sore throat, sneezing, trouble swallowing, neck pain, neck stiffness, voice change, tinnitus and ear discharge.   Eyes: Negative for discharge, redness, itching and visual disturbance.  Respiratory: Positive for cough. Negative for chest tightness, shortness of breath, wheezing and stridor.   Cardiovascular: Negative for chest pain and leg swelling.  Musculoskeletal: Negative for myalgias and arthralgias.  Skin: Negative for color change and rash.  Neurological: Negative for dizziness, facial  asymmetry and headaches.  Psychiatric/Behavioral: Negative for sleep disturbance.   BP 152/82  Pulse 73  Temp(Src) 98.1 F (36.7 C) (Oral)  Ht 6\' 2"  (1.88 m)  Wt 197 lb (89.359 kg)  BMI 25.29 kg/m2  SpO2 96%     Objective:   Physical Exam  Constitutional: He is oriented to person, place, and time. He appears well-developed and well-nourished. No distress.  HENT:  Head: Normocephalic and atraumatic.  Right Ear: External ear normal.  Left Ear: External ear normal.  Nose: Nose normal.  Mouth/Throat: Oropharynx is clear and moist. No oropharyngeal exudate.  Eyes: Conjunctivae and EOM are normal. Pupils are equal, round, and reactive to light. Right eye exhibits no discharge. Left eye exhibits no discharge. No scleral icterus.  Neck: Normal range of motion. Neck supple. No tracheal deviation present. No thyromegaly present.  Cardiovascular: Normal rate, regular rhythm and normal heart sounds.  Exam reveals no gallop and no friction rub.   No murmur heard. Pulmonary/Chest: Effort normal and breath sounds normal. No respiratory distress. He has no wheezes. He has no rales. He exhibits no tenderness.  Musculoskeletal: Normal range of motion. He exhibits no edema.  Lymphadenopathy:    He has no cervical adenopathy.  Neurological: He is alert and oriented to person, place, and time. No cranial nerve deficit. Coordination normal.  Skin: Skin is warm and dry. No rash noted. He is not diaphoretic. No erythema. No pallor.  Psychiatric: He has a normal mood  and affect. His behavior is normal. Judgment and thought content normal.     CXR - no evidence of pneumonia      Assessment & Plan:  1. Viral URI - Symptoms most consistent with viral URI with possible early bronchitis.  Pulmonary exam, however, is normal today.  CXR was normal. Rapid flu was negative. Pt was started on levaquin and codeine for cough by physician at urgent care yesterday.  Encouraged him to continue medication and rest.   We discussed that he may need just more time for symptoms to improve on medications. He will call if symptoms worsening over next 48-72 hours.

## 2011-08-23 NOTE — Telephone Encounter (Signed)
Patient went to walk in clinic yesterday at the hospital and was diagnosed with bronchitis. They put him on levaquin 500mg . Wife says that today patient is feeling worse. Cough is worse, he was unable to sleep at last night because of coughing. Wife is asking if he could get something called in to help with the cough and is also asking if you think that levaquin will be the best thing to treat the bronchitis. Please advise.

## 2011-08-30 ENCOUNTER — Encounter: Payer: Self-pay | Admitting: Internal Medicine

## 2011-09-03 ENCOUNTER — Ambulatory Visit: Payer: Self-pay | Admitting: Oncology

## 2011-09-19 ENCOUNTER — Other Ambulatory Visit: Payer: Self-pay | Admitting: *Deleted

## 2011-09-19 MED ORDER — LOSARTAN POTASSIUM-HCTZ 100-12.5 MG PO TABS: 1.0000 | ORAL_TABLET | Freq: Every day | ORAL | Status: DC

## 2011-09-30 ENCOUNTER — Other Ambulatory Visit: Payer: Self-pay | Admitting: *Deleted

## 2011-09-30 MED ORDER — METOPROLOL SUCCINATE ER 25 MG PO TB24: 25.0000 mg | ORAL_TABLET | Freq: Every day | ORAL | Status: DC

## 2011-09-30 MED ORDER — DIGOXIN 125 MCG PO TABS: 125.0000 ug | ORAL_TABLET | Freq: Every day | ORAL | Status: DC

## 2011-09-30 MED ORDER — SIMVASTATIN 40 MG PO TABS: 40.0000 mg | ORAL_TABLET | Freq: Every day | ORAL | Status: DC

## 2011-09-30 NOTE — Telephone Encounter (Signed)
Faxed requests from [employer].

## 2011-10-23 ENCOUNTER — Telehealth: Payer: Self-pay | Admitting: Internal Medicine

## 2011-10-23 NOTE — Telephone Encounter (Signed)
Left message on machine asking pt to call. 

## 2011-10-23 NOTE — Telephone Encounter (Signed)
Coumadin level is therapeutic,  Continue current regimen and repeat PT/INR in one month 

## 2011-10-24 NOTE — Telephone Encounter (Signed)
Left message asking pt to call.

## 2011-10-25 NOTE — Telephone Encounter (Signed)
Left message on machine advising pt of results, per patient.

## 2011-10-31 ENCOUNTER — Encounter: Payer: Self-pay | Admitting: Internal Medicine

## 2011-11-13 ENCOUNTER — Other Ambulatory Visit: Payer: Self-pay | Admitting: Internal Medicine

## 2011-11-13 MED ORDER — LOSARTAN POTASSIUM-HCTZ 100-12.5 MG PO TABS: 1.0000 | ORAL_TABLET | Freq: Every day | ORAL | Status: DC

## 2011-11-18 ENCOUNTER — Ambulatory Visit: Payer: Self-pay | Admitting: Oncology

## 2011-11-18 LAB — CREATININE, SERUM: Creatinine: 1.17 mg/dL (ref 0.60–1.30)

## 2011-11-20 ENCOUNTER — Ambulatory Visit: Payer: Self-pay | Admitting: Oncology

## 2011-11-21 ENCOUNTER — Telehealth: Payer: Self-pay | Admitting: Internal Medicine

## 2011-11-21 NOTE — Telephone Encounter (Signed)
Coumadin level is therapeutic,  Continue current regimen and repeat PT/INR in one month 

## 2011-11-22 NOTE — Telephone Encounter (Signed)
Patient notified of results.

## 2011-12-02 ENCOUNTER — Ambulatory Visit: Payer: Self-pay | Admitting: Oncology

## 2011-12-02 ENCOUNTER — Other Ambulatory Visit: Payer: Self-pay | Admitting: Internal Medicine

## 2011-12-02 MED ORDER — WARFARIN SODIUM 2.5 MG PO TABS: ORAL_TABLET | ORAL | Status: DC

## 2011-12-04 ENCOUNTER — Encounter: Payer: Self-pay | Admitting: Internal Medicine

## 2011-12-24 LAB — PROTIME-INR

## 2011-12-27 ENCOUNTER — Telehealth: Payer: Self-pay | Admitting: Internal Medicine

## 2011-12-27 NOTE — Telephone Encounter (Signed)
Coumadin level is therapeutic,  Continue current regimen and repeat PT/INR in one month 

## 2011-12-27 NOTE — Telephone Encounter (Signed)
Patient notified

## 2012-01-10 ENCOUNTER — Encounter: Payer: Self-pay | Admitting: Internal Medicine

## 2012-02-11 ENCOUNTER — Encounter: Payer: Self-pay | Admitting: Internal Medicine

## 2012-02-11 ENCOUNTER — Ambulatory Visit (INDEPENDENT_AMBULATORY_CARE_PROVIDER_SITE_OTHER): Payer: Medicare Other | Admitting: Internal Medicine

## 2012-02-11 VITALS — BP 118/64 | HR 66 | Temp 98.1°F | Resp 14 | Ht 74.0 in | Wt 199.5 lb

## 2012-02-11 DIAGNOSIS — Z1211 Encounter for screening for malignant neoplasm of colon: Secondary | ICD-10-CM

## 2012-02-11 DIAGNOSIS — H3500 Unspecified background retinopathy: Secondary | ICD-10-CM | POA: Insufficient documentation

## 2012-02-11 DIAGNOSIS — N138 Other obstructive and reflux uropathy: Secondary | ICD-10-CM

## 2012-02-11 DIAGNOSIS — N400 Enlarged prostate without lower urinary tract symptoms: Secondary | ICD-10-CM | POA: Insufficient documentation

## 2012-02-11 DIAGNOSIS — R911 Solitary pulmonary nodule: Secondary | ICD-10-CM

## 2012-02-11 DIAGNOSIS — Z7901 Long term (current) use of anticoagulants: Secondary | ICD-10-CM

## 2012-02-11 DIAGNOSIS — Z125 Encounter for screening for malignant neoplasm of prostate: Secondary | ICD-10-CM

## 2012-02-11 DIAGNOSIS — R5383 Other fatigue: Secondary | ICD-10-CM

## 2012-02-11 DIAGNOSIS — R5381 Other malaise: Secondary | ICD-10-CM

## 2012-02-11 DIAGNOSIS — E785 Hyperlipidemia, unspecified: Secondary | ICD-10-CM

## 2012-02-11 DIAGNOSIS — N401 Enlarged prostate with lower urinary tract symptoms: Secondary | ICD-10-CM

## 2012-02-11 DIAGNOSIS — E786 Lipoprotein deficiency: Secondary | ICD-10-CM

## 2012-02-11 MED ORDER — ZOSTER VACCINE LIVE 19400 UNT/0.65ML ~~LOC~~ SOLR: 0.6500 mL | Freq: Once | SUBCUTANEOUS | Status: AC

## 2012-02-11 NOTE — Patient Instructions (Addendum)
Try to find a record of your last GI encounter for colonoscopy in 2009 so I can get report.    I have ordered your fasting labs thorugh [facility] and will call you with the results.  Your next protime is due around June 28th,.    You need to get the TdaP vaccine;  It is available at no charge at the health Dept on [address]  The shingles vaccine (Zostvax) is available with prexceiption at [employer] and [employer]

## 2012-02-11 NOTE — Assessment & Plan Note (Signed)
He had an incidental finding of a 6 mm RLL nodule on recent CT abdomen done by [facility] for followup on renal cell carcinoma, left kidney.  Dr. Lonna Cobb has referred him to Dr. Orlie Dakin at [facility]. Dr. Orlie Dakin is repeating a CT of chest and abdomen  In Sept 2013.

## 2012-02-11 NOTE — Assessment & Plan Note (Addendum)
His right lobe felt enlarged on exam today.  History of TURP in 2003.,  Currently having nocturia x 3 or 4.  checking PSA AND ua>

## 2012-02-11 NOTE — Progress Notes (Signed)
Patient ID: Hallie Ishida, male   DOB: [birth year], 78 y.o.   MRN: [id number] The patient is here for annual Medicare wellness examination and management of other chronic and acute problems.  Recent fall onto face after tripping off the curb about 5 months. No singificant injurie.    The risk factors are reflected in the social history.  The roster of all physicians providing medical care to patient - is listed in the Snapshot section of the chart.  Activities of daily living:  The patient is 100% independent in all ADLs: dressing, toileting, feeding as well as independent mobility  Home safety : The patient has smoke detectors in the home. They wear seatbelts.  There are no firearms at home. There is no violence in the home.   There is no risks for hepatitis, STDs or HIV. There is no   history of blood transfusion. They have no travel history to infectious disease endemic areas of the world.  The patient has seen their dentist in the last six month. They have seen their eye doctor in the last year. They admit to slight hearing difficulty with regard to whispered voices and some television programs.  They have deferred audiologic testing in the last year.  They do not  have excessive sun exposure. Discussed the need for sun protection: hats, long sleeves and use of sunscreen if there is significant sun exposure.   Diet: the importance of a healthy diet is discussed. They do have a healthy diet.  The benefits of regular aerobic exercise were discussed. She walks 4 times per week ,  20 minutes.   Depression screen: there are no signs or vegative symptoms of depression- irritability, change in appetite, anhedonia, sadness/tearfullness.  Cognitive assessment: the patient manages all their financial and personal affairs and is actively engaged. They could relate day,date,year and events; recalled 2/3 objects at 3 minutes; performed clock-face test normally.  The following portions of the  patient's history were reviewed and updated as appropriate: allergies, current medications, past family history, past medical history,  past surgical history, past social history  and problem list.  Visual acuity was not assessed per patient preference since she has regular follow up with her ophthalmologist. Hearing and body mass index were assessed and reviewed.   During the course of the visit the patient was educated and counseled about appropriate screening and preventive services including : fall prevention , diabetes screening, nutrition counseling, colorectal cancer screening, and recommended immunizations.    BP 118/64  Pulse 66  Temp 98.1 F (36.7 C) (Oral)  Resp 14  Ht 6\' 2"  (1.88 m)  Wt 199 lb 8 oz (90.493 kg)  BMI 25.61 kg/m2  SpO2 96%  Assessment and Plan  Prostatic enlargement His right lobe felt enlarged on exam today.  History of TURP in 2003.,  Currently having nocturia x 3 or 4.  checking PSA AND ua>   Pulmonary nodule/lesion, solitary He had an incidental finding of a 6 mm RLL nodule on recent CT abdomen done by [facility] for followup on renal cell carcinoma, left kidney.  Dr. Lonna Cobb has referred him to Dr. Orlie Dakin at [facility]. Dr. Orlie Dakin is repeating a CT of chest and abdomen  In Sept 2013.     Screening for colon cancer Hemoccult card was done today and was negative.    Updated Medication List Outpatient Encounter Prescriptions as of 02/11/2012  Medication Sig Dispense Refill  . digoxin (LANOXIN) 0.125 MG tablet Take 1 tablet (125 mcg total) by  mouth daily.  90 tablet  3  . losartan-hydrochlorothiazide (HYZAAR) 100-12.5 MG per tablet Take 1 tablet by mouth daily.  90 tablet  3  . metoprolol succinate (TOPROL XL) 25 MG 24 hr tablet Take 1 tablet (25 mg total) by mouth daily.  90 tablet  3  . simvastatin (ZOCOR) 40 MG tablet Take 1 tablet (40 mg total) by mouth at bedtime.  90 tablet  3  . warfarin (COUMADIN) 2.5 MG tablet Take two tablets (5 mg) on Sun, Mon, Wed,  Fri.  One tablet (2.5 mg) on Tues, Thurs, Sat.  Or as directed.  90 tablet  3  . zoster vaccine live, PF, (ZOSTAVAX) 16109 UNT/0.65ML injection Inject 19,400 Units into the skin once.  1 vial  0  . DISCONTD: zileuton (ZYFLO CR) 600 MG CR tablet Take 1 tablet (600 mg total) by mouth 2 (two) times daily.  40 tablet  1

## 2012-02-13 ENCOUNTER — Other Ambulatory Visit: Payer: Self-pay | Admitting: Internal Medicine

## 2012-02-13 DIAGNOSIS — N400 Enlarged prostate without lower urinary tract symptoms: Secondary | ICD-10-CM

## 2012-02-13 DIAGNOSIS — Z7901 Long term (current) use of anticoagulants: Secondary | ICD-10-CM

## 2012-02-13 DIAGNOSIS — E785 Hyperlipidemia, unspecified: Secondary | ICD-10-CM

## 2012-02-13 DIAGNOSIS — I10 Essential (primary) hypertension: Secondary | ICD-10-CM

## 2012-02-13 LAB — URINALYSIS, ROUTINE W REFLEX MICROSCOPIC
Bilirubin, UA: NEGATIVE
Glucose, UA: NEGATIVE
Protein, UA: NEGATIVE
RBC, UA: NEGATIVE
Urobilinogen, Ur: 0.2 mg/dL (ref 0.0–1.9)
pH, UA: 6 (ref 5.0–7.5)

## 2012-02-13 LAB — PSA, TOTAL AND FREE
PSA, Free Pct: 40 %
PSA, Free: 0.4 ng/mL

## 2012-02-13 LAB — COMPREHENSIVE METABOLIC PANEL
ALT: 30 IU/L (ref 0–44)
AST: 31 IU/L (ref 0–40)
Alkaline Phosphatase: 81 IU/L (ref 25–160)
BUN/Creatinine Ratio: 19 (ref 10–22)
CO2: 25 mmol/L (ref 20–32)
Calcium: 9.6 mg/dL (ref 8.6–10.2)
Chloride: 103 mmol/L (ref 97–108)
Potassium: 4.3 mmol/L (ref 3.5–5.2)
Sodium: 139 mmol/L (ref 134–144)

## 2012-02-13 LAB — LIPID PANEL W/O CHOL/HDL RATIO
Cholesterol, Total: 139 mg/dL (ref 100–199)
LDL Calculated: 75 mg/dL (ref 0–99)
VLDL Cholesterol Cal: 10 mg/dL (ref 5–40)

## 2012-02-13 LAB — PROTIME-INR: INR: 2.4 — ABNORMAL HIGH (ref 0.8–1.2)

## 2012-02-14 DIAGNOSIS — Z1211 Encounter for screening for malignant neoplasm of colon: Secondary | ICD-10-CM | POA: Insufficient documentation

## 2012-02-14 NOTE — Assessment & Plan Note (Signed)
Hemoccult card was done today and was negative.

## 2012-02-28 ENCOUNTER — Encounter: Payer: Self-pay | Admitting: Internal Medicine

## 2012-03-16 ENCOUNTER — Telehealth: Payer: Self-pay | Admitting: Internal Medicine

## 2012-03-16 NOTE — Telephone Encounter (Signed)
Coumadin level is therapeutic,  Continue current regimen and repeat PT/INR in one month 

## 2012-03-16 NOTE — Telephone Encounter (Signed)
Patient notified

## 2012-03-24 ENCOUNTER — Encounter: Payer: Self-pay | Admitting: Internal Medicine

## 2012-03-30 ENCOUNTER — Ambulatory Visit (INDEPENDENT_AMBULATORY_CARE_PROVIDER_SITE_OTHER): Payer: Medicare Other | Admitting: *Deleted

## 2012-03-30 ENCOUNTER — Telehealth: Payer: Self-pay | Admitting: Internal Medicine

## 2012-03-30 DIAGNOSIS — N390 Urinary tract infection, site not specified: Secondary | ICD-10-CM

## 2012-03-30 LAB — POCT URINALYSIS DIPSTICK
Bilirubin, UA: NEGATIVE
Glucose, UA: NEGATIVE
Ketones, UA: NEGATIVE
Spec Grav, UA: 1.02

## 2012-03-30 NOTE — Telephone Encounter (Signed)
Patient scheduled for nurse visit today to leave urine sample.

## 2012-03-30 NOTE — Telephone Encounter (Signed)
Caller: Jovin/Patient; PCP: Duncan Dull; CB#: [phone];  Call regarding Urinary Pain/Bleeding; Blood in urine onset "late yesterday".  Emergent sx ruled out. See in 4 hours per Bloody Urine protocol. Home care for the interim.   No available appointments at office; note to office for follow up.

## 2012-04-01 ENCOUNTER — Telehealth: Payer: Self-pay | Admitting: Internal Medicine

## 2012-04-01 LAB — URINE CULTURE
Colony Count: NO GROWTH
Organism ID, Bacteria: NO GROWTH

## 2012-04-01 NOTE — Telephone Encounter (Signed)
Patient says you have an obligation to call him with his urine results.

## 2012-04-02 NOTE — Telephone Encounter (Signed)
I have spoke with patient and gave him the urine results.  I was waiting to call him until the urine culture results came back.

## 2012-04-09 ENCOUNTER — Encounter: Payer: Self-pay | Admitting: Internal Medicine

## 2012-04-09 ENCOUNTER — Ambulatory Visit (INDEPENDENT_AMBULATORY_CARE_PROVIDER_SITE_OTHER): Payer: Medicare Other | Admitting: Internal Medicine

## 2012-04-09 VITALS — BP 128/74 | HR 55 | Temp 98.4°F | Resp 14 | Wt 200.5 lb

## 2012-04-09 DIAGNOSIS — R319 Hematuria, unspecified: Secondary | ICD-10-CM

## 2012-04-09 DIAGNOSIS — N400 Enlarged prostate without lower urinary tract symptoms: Secondary | ICD-10-CM

## 2012-04-09 DIAGNOSIS — I6529 Occlusion and stenosis of unspecified carotid artery: Secondary | ICD-10-CM

## 2012-04-09 DIAGNOSIS — I4891 Unspecified atrial fibrillation: Secondary | ICD-10-CM

## 2012-04-09 DIAGNOSIS — R310 Gross hematuria: Secondary | ICD-10-CM

## 2012-04-09 DIAGNOSIS — R911 Solitary pulmonary nodule: Secondary | ICD-10-CM

## 2012-04-09 NOTE — Progress Notes (Signed)
Patient ID: Kyle Gutierrez, male   DOB: 07-05-1932, 78 y.o.   MRN: [id number]  Patient Active Problem List  Diagnosis  . PERIPHERAL NEUROPATHY  . HYPERTENSION  . ATRIAL FIBRILLATION  . LUMBAR RADICULOPATHY, LEFT  . SKIN CANCER, HX OF  . COLONIC POLYPS, HX OF  . DIVERTICULITIS, HX OF  . Peripheral vascular disease  . Substance abuse  . Thrombocytopenia, unspecified  . Neuropathy, alcoholic  . Pulmonary nodule/lesion, solitary  . Retinopathy  . Screening for colon cancer  . Chronic atrial fibrillation  . Coronary artery disease, non-occlusive  . Carotid artery plaque  . Valvular cardiomyopathy  . renal cell carcinoma  . Benign prostatic hypertrophy  . Hematuria, gross    Subjective:  CC:   Chief Complaint  Patient presents with  . Hematuria    HPI:   Kyle Gutierrez a 78 y.o. male who presents grossly bloody urine. Patient states that the episode lasted 48 hours and last week after coming in for urinalysis. His urinalysis was positive for blood but was not sent for a microscopic analysis and urine culture however was negative. His symptoms have been resolved but I asked him to return for an office appointment for discussion. He has had no history of flank pain abdominal pain nephrolithiasis or recent use of NSAIDs but does have a history of renal cell carcinoma and underwent a partial nephrectomy several years ago. he also takes coumadin  for chronic atrial fibrillation and his INR was 2.5 on July 6.  He has a history of a  benign prostatic hypertrophy status post TURP,  and his PSA was 1.0 in June.   he had the findings of 2 exophytic bilateral renal lesions noted on Dec 2012 CT,  And  multiple subcentimeter pulmonary nodules too small for PET scan characterization followed by Dr. Gerarda Fraction with chest CT in March , with follow up  CT of chest abdomen and pelvis ordered by Dr. Orlie Dakin for next month.     Past Medical History  Diagnosis Date  . Hypertension     . Substance abuse     alcohol  . Thrombocytopenia, unspecified   . Neuropathy, alcoholic     per Neurologic eval, remote  . Retinopathy     followed by [facility]  . Chronic atrial fibrillation   . Coronary artery disease, non-occlusive   . Carotid artery plaque     bilateral  . Valvular cardiomyopathy     severe mitral and tricuspid insufficiency, ECHO July 2012  . renal cell carcinoma   . Benign prostatic hypertrophy     s/p TURP    Past Surgical History  Procedure Date  . Transurethral resection of prostate 2003  . Prostate surgery   . Eye surgery     bilateral cataracts removed  . Partial nephrectomy     The following portions of the patient's history were reviewed and updated as appropriate: Allergies, current medications, and problem list.    Review of Systems:   A comprehensive ROS was done and positive for blood in urine.   The rest was negative.   History   Social History  . Marital Status: Married    Spouse Name: N/A    Number of Children: N/A  . Years of Education: N/A   Occupational History  . Not on file.   Social History Main Topics  . Smoking status: Former Smoker    Quit date: 03/29/1977  . Smokeless tobacco: Never Used  . Alcohol Use: Yes  .  Drug Use: No  . Sexually Active: Not on file   Other Topics Concern  . Not on file   Social History Narrative  . No narrative on file    Objective:  BP 128/74  Pulse 55  Temp 98.4 F (36.9 C) (Oral)  Resp 14  Wt 200 lb 8 oz (90.946 kg)  SpO2 97%  General appearance: alert, cooperative and appears stated age Ears: normal TM's and external ear canals both ears Throat: lips, mucosa, and tongue normal; teeth and gums normal Neck: no adenopathy, no carotid bruit, supple, symmetrical, trachea midline and thyroid not enlarged, symmetric, no tenderness/mass/nodules Back: symmetric, no curvature. ROM normal. No CVA tenderness. Lungs: clear to auscultation bilaterally Heart: regular rate and  rhythm, S1, S2 normal, no murmur, click, rub or gallop Abdomen: soft, non-tender; bowel sounds normal; no masses,  no organomegaly Pulses: 2+ and symmetric Skin: Skin color, texture, turgor normal. No rashes or lesions Lymph nodes: Cervical, supraclavicular, and axillary nodes normal.  Assessment and Plan:  BENIGN PROSTATIC HYPERTROPHY With prior TURP procedure PSA 1.0 this year.  He has no current obstructive symptoms. Urinalysis was today normal and urine culture done at the time of hematuria last week was negative for infection.  Benign prostatic hypertrophy Status post TURP remotely, with recent PSA 1.0. He has no obstructive symptoms currently. He did have gross hematuria last week at which time a urine culture was negative. Repeat urinalysis today is negative for blood or signs of infection.  Carotid artery plaque Seen on carotid Dopplers done in  July 2012 by Kyra Searles due to an epiosde of new onset recurrent one-sided headacheswi andnomraml head CT and a th . There were no signs of calcification or ulceration. Continue Coumadin for atrial fibrillation and aggressive lipid management. He will need a followup carotid Doppler annually which he is due for now.  ATRIAL FIBRILLATION Rate controlled and anticoagulated. 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 Last echo July 2012.  Hematuria, gross His episodes of gross hematuria are concerning, given his history of  left renal cell carcinoma.  He has stable exophytic lesions bilaterally by followup CTs.  I am recommending that he go back to see Dr. Lonna Cobb for cystoscopy.  He is scheduled for a follow up CT in Aug/September which has been ordered by Dr. Orlie Dakin for followup on multiple pulmonary nodules that have been too small to evaluate with PET scan.   Pulmonary nodule/lesion, solitary He is being  followed by [address] at [facility] oncology for a 6 mm stable right pulmonary nodule. Repeat chest CT is due next month.   Updated Medication List Outpatient Encounter Prescriptions as of 04/09/2012  Medication Sig Dispense Refill  . digoxin (LANOXIN) 0.125 MG tablet Take 1 tablet (125 mcg total) by mouth daily.  90 tablet  3  . losartan-hydrochlorothiazide (HYZAAR) 100-12.5 MG per tablet Take 1 tablet by mouth daily.  90 tablet  3  . metoprolol succinate (TOPROL XL) 25 MG 24 hr tablet Take 1 tablet (25 mg total) by mouth daily.  90 tablet  3  . simvastatin (ZOCOR) 40 MG tablet Take 1 tablet (40 mg total) by mouth at bedtime.  90 tablet  3  . warfarin (COUMADIN) 2.5 MG tablet Take two tablets (5 mg) on Sun, Mon, Wed, Fri.  One tablet (2.5 mg) on Tues, Thurs, Sat.  Or as directed.  90 tablet  3  Orders Placed This Encounter  Procedures  . Urinalysis, microscopic only    No Follow-up on file.

## 2012-04-09 NOTE — Patient Instructions (Signed)
I will call you wit the results of your urinalyss.  It will take a day or so because it has to go to the lab.  We will send the results to Dr. Lonna Cobb along with your lab  Results

## 2012-04-10 LAB — URINALYSIS, MICROSCOPIC ONLY
Casts: NONE SEEN
Crystals: NONE SEEN
Squamous Epithelial / LPF: NONE SEEN

## 2012-04-11 ENCOUNTER — Encounter: Payer: Self-pay | Admitting: Internal Medicine

## 2012-04-11 DIAGNOSIS — R310 Gross hematuria: Secondary | ICD-10-CM | POA: Insufficient documentation

## 2012-04-11 DIAGNOSIS — I428 Other cardiomyopathies: Secondary | ICD-10-CM | POA: Insufficient documentation

## 2012-04-11 DIAGNOSIS — C801 Malignant (primary) neoplasm, unspecified: Secondary | ICD-10-CM | POA: Insufficient documentation

## 2012-04-11 DIAGNOSIS — I6529 Occlusion and stenosis of unspecified carotid artery: Secondary | ICD-10-CM | POA: Insufficient documentation

## 2012-04-11 DIAGNOSIS — I482 Chronic atrial fibrillation, unspecified: Secondary | ICD-10-CM | POA: Insufficient documentation

## 2012-04-11 DIAGNOSIS — N400 Enlarged prostate without lower urinary tract symptoms: Secondary | ICD-10-CM | POA: Insufficient documentation

## 2012-04-11 DIAGNOSIS — I2510 Atherosclerotic heart disease of native coronary artery without angina pectoris: Secondary | ICD-10-CM | POA: Insufficient documentation

## 2012-04-11 NOTE — Assessment & Plan Note (Signed)
He is being followed by [address] [facility] at [facility] oncology for a 6 mm stable right pulmonary nodule. Repeat chest CT is due next month.

## 2012-04-11 NOTE — Assessment & Plan Note (Signed)
With prior TURP procedure PSA 1.0 this year.  He has no current obstructive symptoms. Urinalysis was today normal and urine culture done at the time of hematuria last week was negative for infection.

## 2012-04-11 NOTE — Assessment & Plan Note (Signed)
Seen on carotid Dopplers done in  July 2012 by Kyra Searles due to an epiosde of new onset recurrent one-sided headacheswi andnomraml head CT and a th . There were no signs of calcification or ulceration. Continue Coumadin for atrial fibrillation and aggressive lipid management. He will need a followup carotid Doppler annually which he is due for now.

## 2012-04-11 NOTE — Assessment & Plan Note (Signed)
Rate controlled and anticoagulated. 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 Last echo July 2012.

## 2012-04-11 NOTE — Assessment & Plan Note (Signed)
His episodes of gross hematuria are concerning, given his history of  left renal cell carcinoma.  He has stable exophytic lesions bilaterally by followup CTs.  I am recommending that he go back to see Dr. Lonna Cobb for cystoscopy.  He is scheduled for a follow up CT in Aug/September which has been ordered by Dr. Orlie Dakin for followup on multiple pulmonary nodules that have been too small to evaluate with PET scan.

## 2012-04-11 NOTE — Assessment & Plan Note (Signed)
Status post TURP remotely, with recent PSA 1.0. He has no obstructive symptoms currently. He did have gross hematuria last week at which time a urine culture was negative. Repeat urinalysis today is negative for blood or signs of infection.

## 2012-04-20 ENCOUNTER — Encounter: Payer: Self-pay | Admitting: Internal Medicine

## 2012-05-06 ENCOUNTER — Telehealth: Payer: Self-pay | Admitting: Internal Medicine

## 2012-05-06 NOTE — Telephone Encounter (Signed)
INR 2.6

## 2012-05-13 ENCOUNTER — Encounter: Payer: Self-pay | Admitting: Internal Medicine

## 2012-05-19 ENCOUNTER — Ambulatory Visit: Payer: Self-pay | Admitting: Oncology

## 2012-05-19 LAB — CREATININE, SERUM
Creatinine: 1.5 mg/dL — ABNORMAL HIGH (ref 0.60–1.30)
EGFR (African American): 51 — ABNORMAL LOW

## 2012-06-02 ENCOUNTER — Ambulatory Visit: Payer: Self-pay | Admitting: Oncology

## 2012-07-02 ENCOUNTER — Telehealth: Payer: Self-pay | Admitting: Internal Medicine

## 2012-07-02 NOTE — Telephone Encounter (Signed)
His INR is perfect,  Continue current regimen a repeat in one month.

## 2012-07-02 NOTE — Telephone Encounter (Signed)
Patient notified of lab results

## 2012-07-07 IMAGING — CR DG CHEST 2V
3 series · 3 of 3 positions shown · non-contrast
Comparison: None.

CLINICAL DATA: Cough, hypertension

CHEST - 2 VIEW

[view not recorded (1 of 3)]
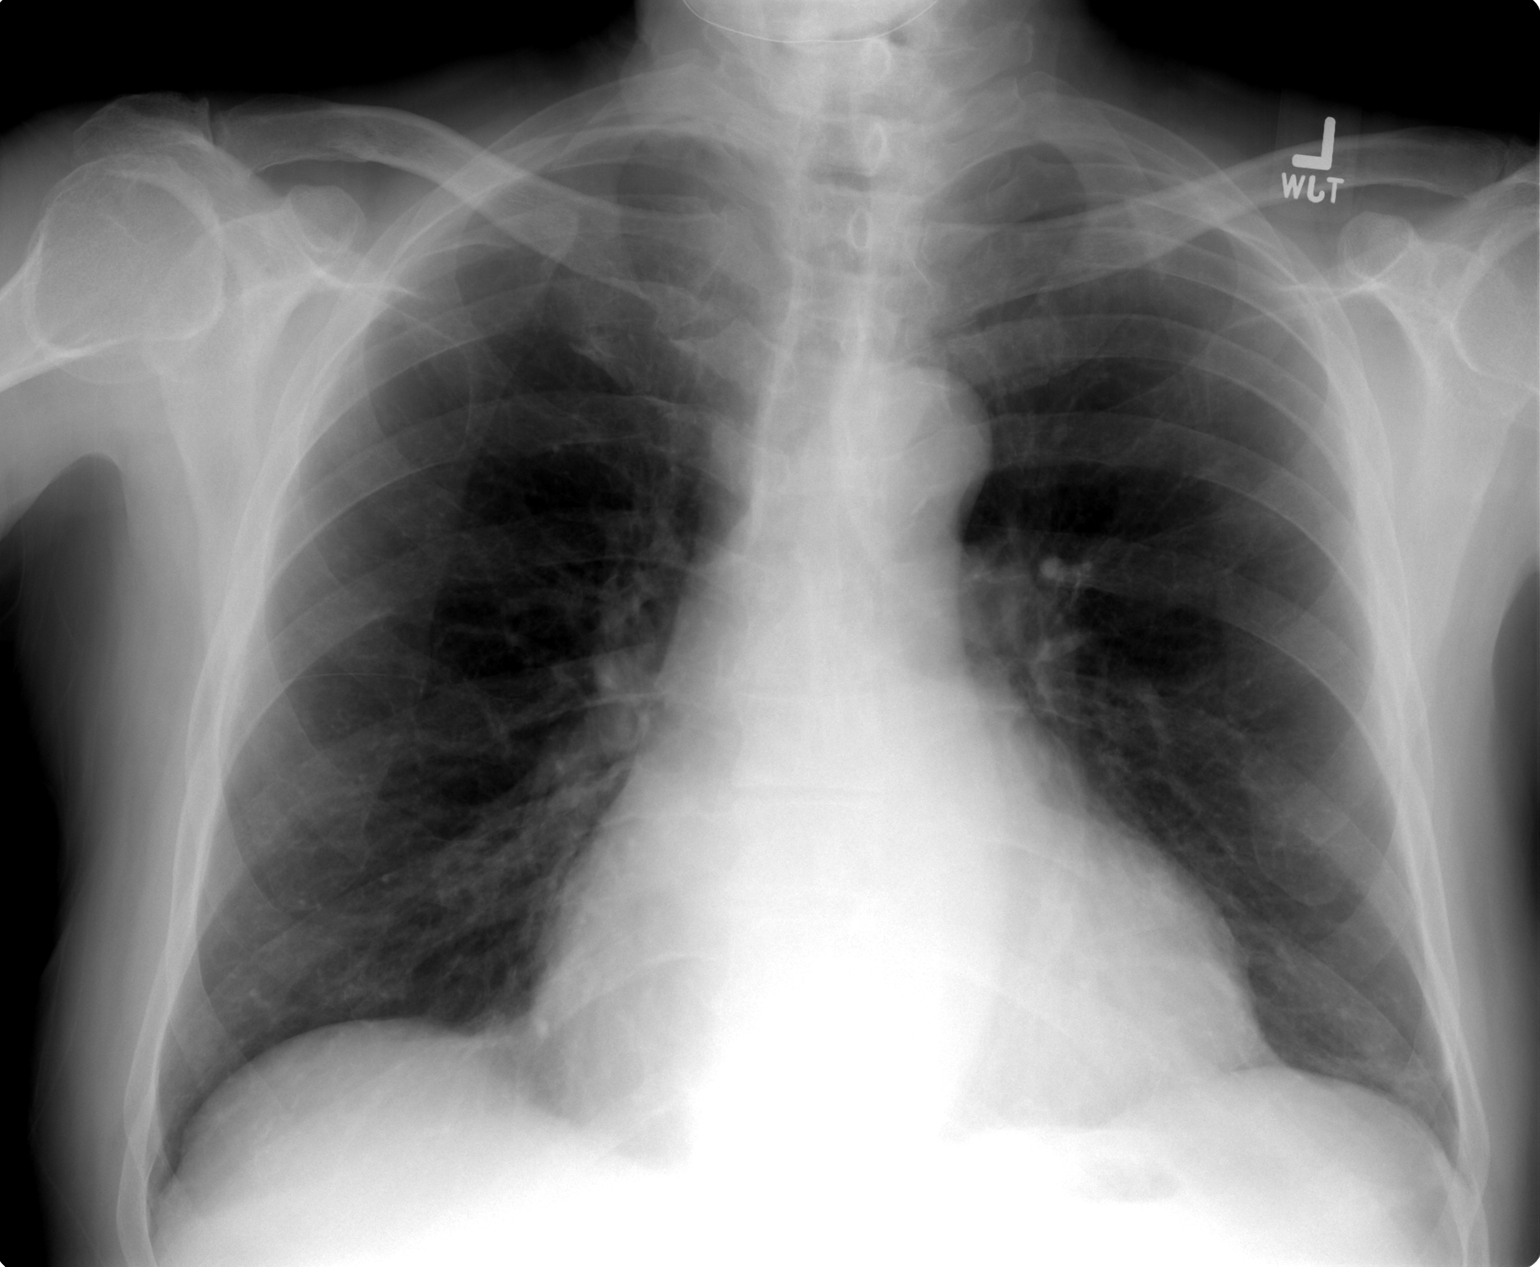

[view not recorded (2 of 3)]
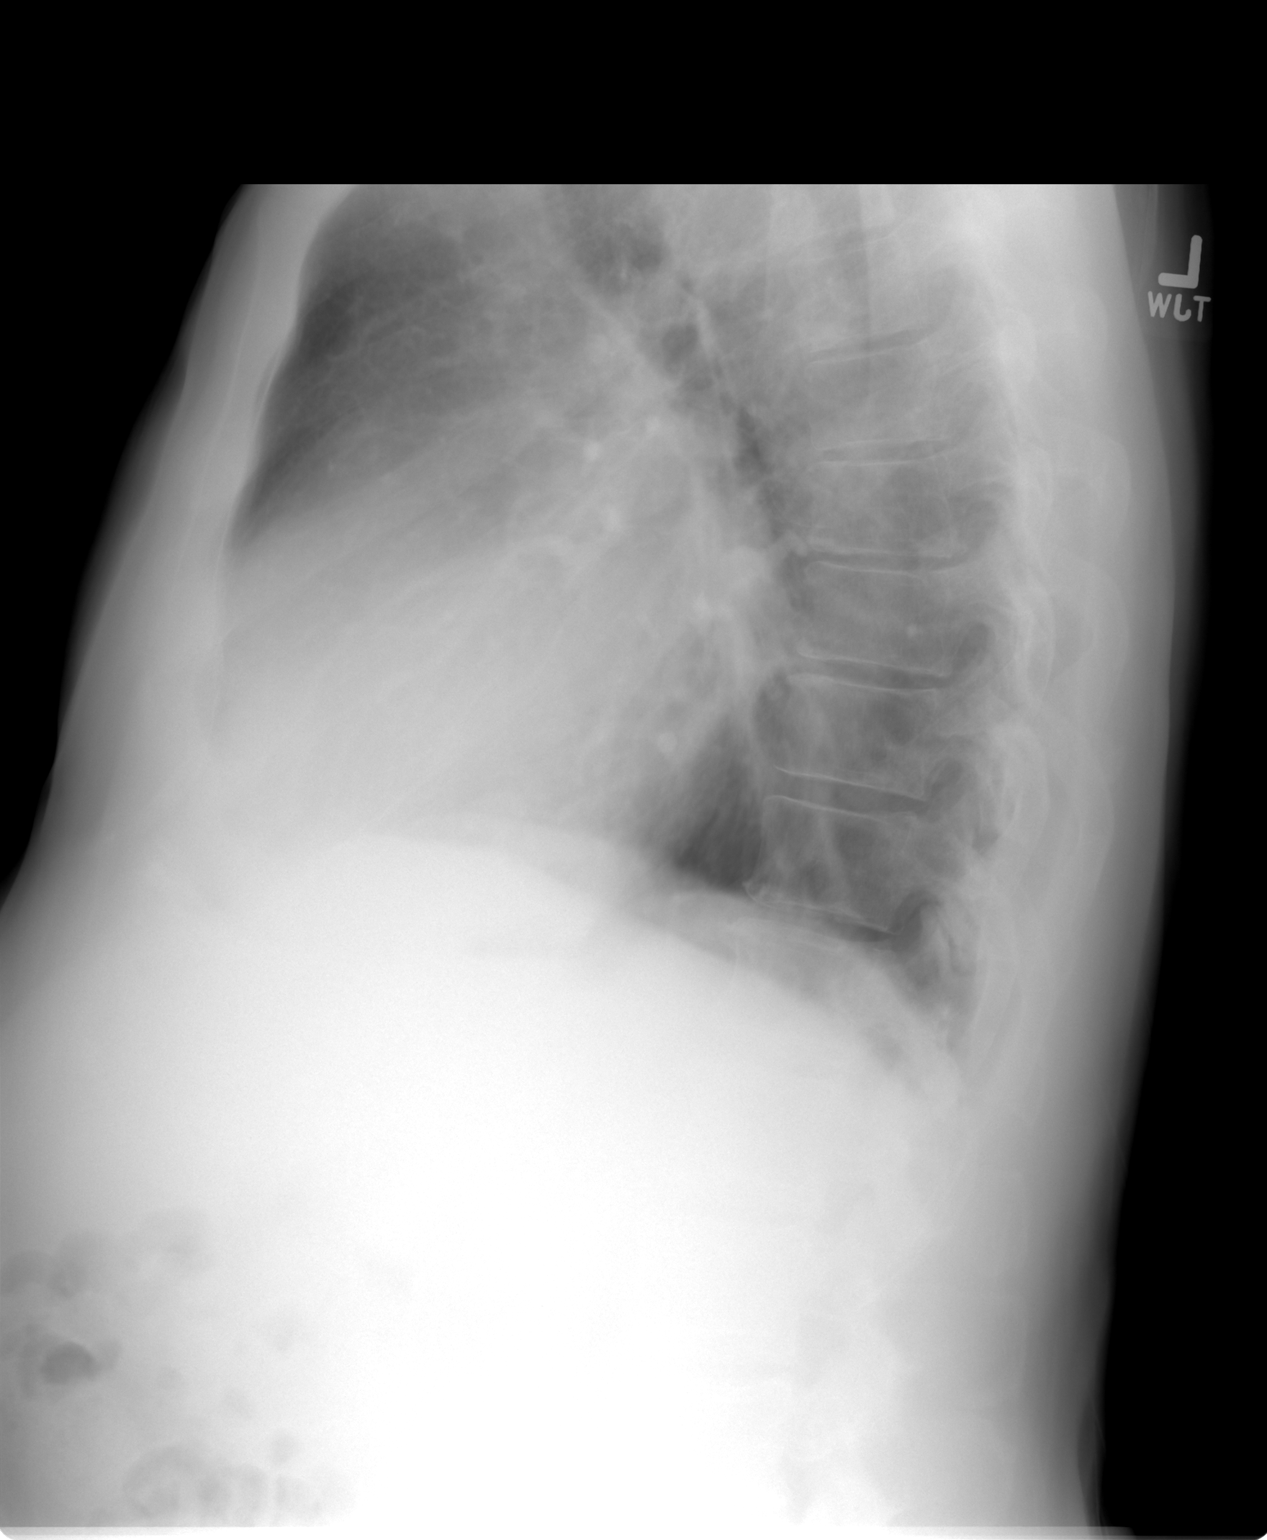

[view not recorded (3 of 3)]
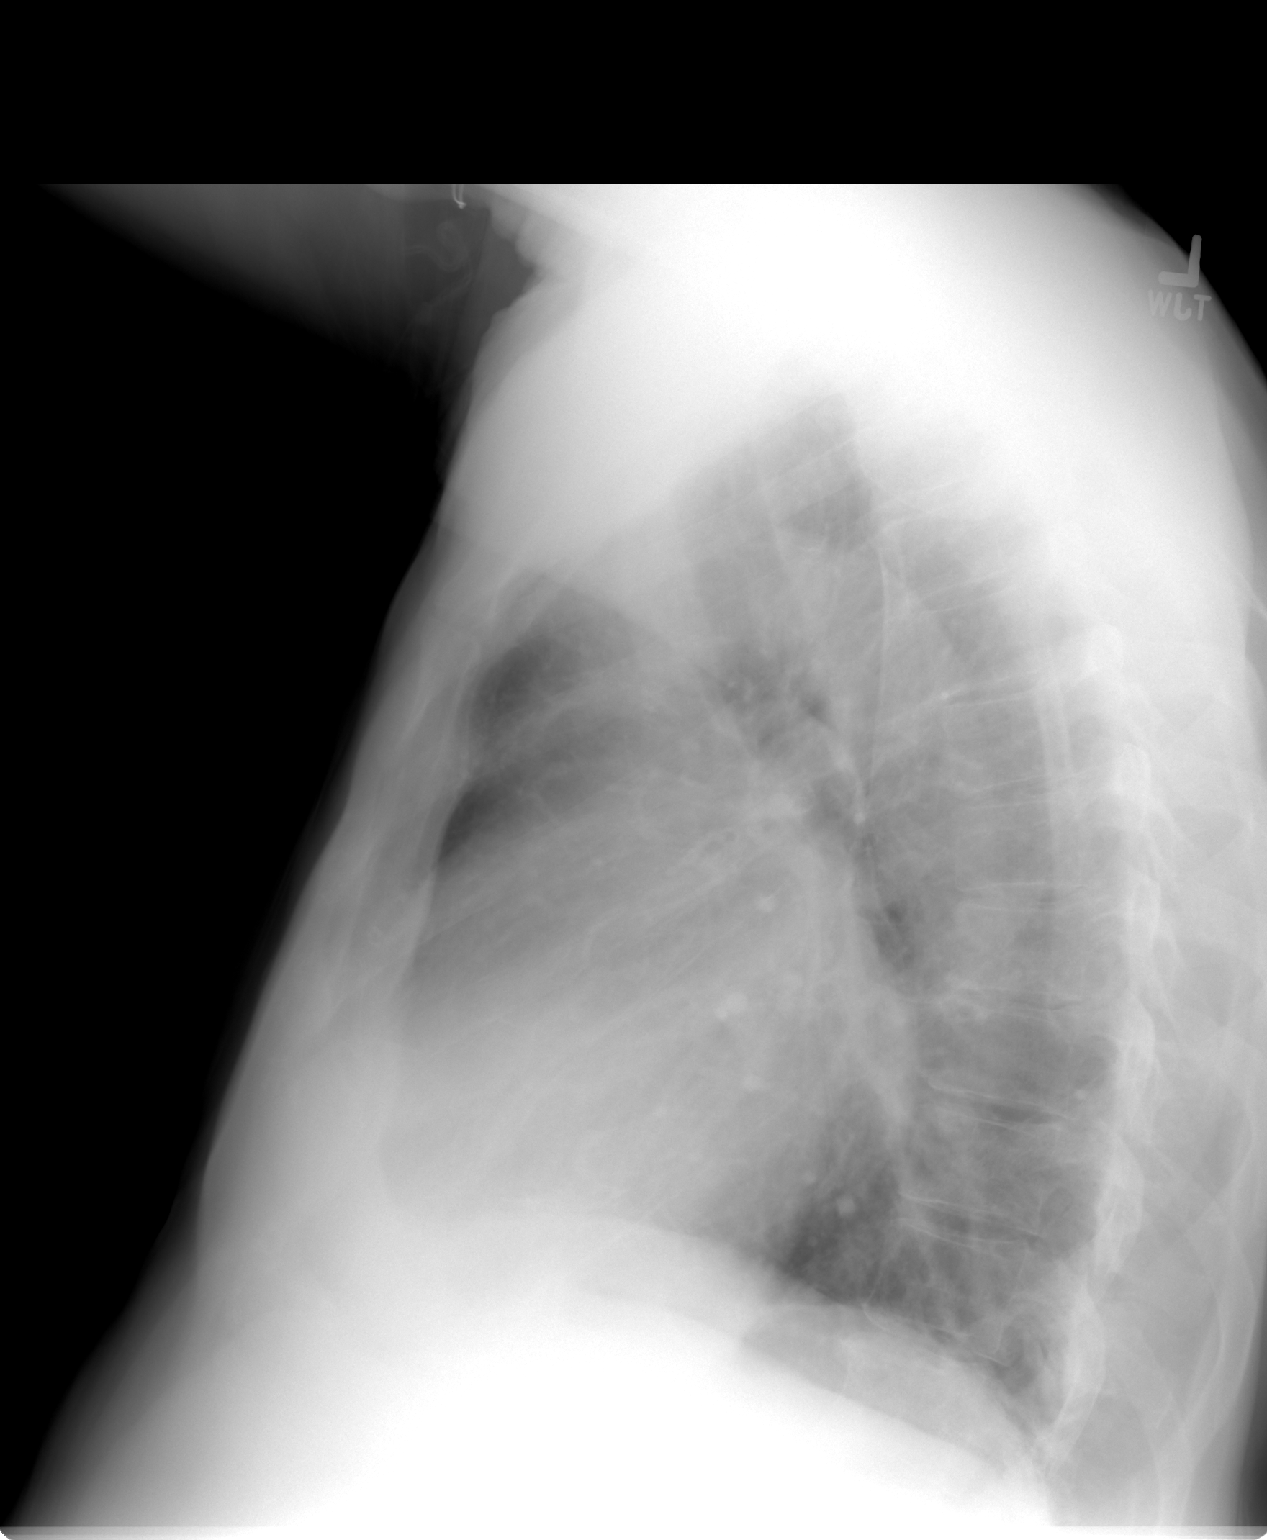

[3 of 3 positions shown; findings below may reference images not displayed]

FINDINGS: The lungs are clear and slightly hyperaerated.  There is
moderate cardiomegaly present.  No acute bony abnormality is seen.
IMPRESSION: Cardiomegaly.  No active lung disease.

## 2012-07-09 ENCOUNTER — Encounter: Payer: Self-pay | Admitting: Internal Medicine

## 2012-07-28 LAB — PROTIME-INR: INR: 1.9 — AB (ref 0.9–1.1)

## 2012-07-29 ENCOUNTER — Telehealth: Payer: Self-pay | Admitting: Internal Medicine

## 2012-07-29 NOTE — Telephone Encounter (Signed)
His protime has dropped  a little.  Has he missed any doses of coumadin in the last 10 days?

## 2012-07-29 NOTE — Telephone Encounter (Signed)
His chart states that his current regigmen is 5 mg 4 days per week and 2.5 mg three days per week.  If this is correct,  Increase regimen to 5 mg every day except  Mondays and Thursdays .  Take 2.5 mg on Mondays and thursdays.  Repeat a PT/INR in 2 weeks

## 2012-07-29 NOTE — Telephone Encounter (Signed)
Spoke to patient gave him lab results. He stated that he has not missed any doses.

## 2012-07-29 NOTE — Telephone Encounter (Signed)
Patient notified as instructed. 

## 2012-07-29 NOTE — Telephone Encounter (Signed)
He has not missed any doses of coumadin

## 2012-08-11 ENCOUNTER — Ambulatory Visit (INDEPENDENT_AMBULATORY_CARE_PROVIDER_SITE_OTHER): Payer: Medicare Other | Admitting: Internal Medicine

## 2012-08-11 ENCOUNTER — Other Ambulatory Visit: Payer: Self-pay

## 2012-08-11 ENCOUNTER — Encounter: Payer: Self-pay | Admitting: Internal Medicine

## 2012-08-11 VITALS — BP 120/64 | HR 65 | Temp 97.7°F | Resp 12 | Ht 74.0 in | Wt 200.5 lb

## 2012-08-11 DIAGNOSIS — R310 Gross hematuria: Secondary | ICD-10-CM

## 2012-08-11 DIAGNOSIS — N400 Enlarged prostate without lower urinary tract symptoms: Secondary | ICD-10-CM

## 2012-08-11 DIAGNOSIS — R5381 Other malaise: Secondary | ICD-10-CM

## 2012-08-11 DIAGNOSIS — Z125 Encounter for screening for malignant neoplasm of prostate: Secondary | ICD-10-CM

## 2012-08-11 DIAGNOSIS — E785 Hyperlipidemia, unspecified: Secondary | ICD-10-CM

## 2012-08-11 DIAGNOSIS — Z79899 Other long term (current) drug therapy: Secondary | ICD-10-CM

## 2012-08-11 DIAGNOSIS — N138 Other obstructive and reflux uropathy: Secondary | ICD-10-CM

## 2012-08-11 DIAGNOSIS — R5383 Other fatigue: Secondary | ICD-10-CM

## 2012-08-11 DIAGNOSIS — C801 Malignant (primary) neoplasm, unspecified: Secondary | ICD-10-CM

## 2012-08-11 DIAGNOSIS — Z1211 Encounter for screening for malignant neoplasm of colon: Secondary | ICD-10-CM

## 2012-08-11 DIAGNOSIS — R319 Hematuria, unspecified: Secondary | ICD-10-CM

## 2012-08-11 DIAGNOSIS — I10 Essential (primary) hypertension: Secondary | ICD-10-CM

## 2012-08-11 DIAGNOSIS — Z7901 Long term (current) use of anticoagulants: Secondary | ICD-10-CM | POA: Insufficient documentation

## 2012-08-11 DIAGNOSIS — N401 Enlarged prostate with lower urinary tract symptoms: Secondary | ICD-10-CM

## 2012-08-11 DIAGNOSIS — R911 Solitary pulmonary nodule: Secondary | ICD-10-CM

## 2012-08-11 LAB — URINALYSIS, ROUTINE W REFLEX MICROSCOPIC
Nitrite: NEGATIVE
Total Protein, Urine: NEGATIVE
Urine Glucose: NEGATIVE
pH: 7 (ref 5.0–8.0)

## 2012-08-11 LAB — COMPREHENSIVE METABOLIC PANEL
ALT: 23 U/L (ref 0–53)
AST: 26 U/L (ref 0–37)
Albumin: 4.2 g/dL (ref 3.5–5.2)
Alkaline Phosphatase: 74 U/L (ref 39–117)
Chloride: 102 mEq/L (ref 96–112)
GFR: 49.97 mL/min — ABNORMAL LOW (ref >60.00)
Potassium: 4.2 mEq/L (ref 3.5–5.1)
Sodium: 138 mEq/L (ref 135–145)
Total Protein: 7.1 g/dL (ref 6.0–8.3)

## 2012-08-11 LAB — PROTIME-INR: INR: 2.5 ratio — ABNORMAL HIGH (ref 0.8–1.0)

## 2012-08-11 MED ORDER — METOPROLOL SUCCINATE ER 25 MG PO TB24: 25.0000 mg | ORAL_TABLET | Freq: Every day | ORAL | Status: DC

## 2012-08-11 MED ORDER — LOSARTAN POTASSIUM-HCTZ 100-12.5 MG PO TABS: 0.5000 | ORAL_TABLET | Freq: Every day | ORAL | Status: DC

## 2012-08-11 MED ORDER — SIMVASTATIN 40 MG PO TABS: 40.0000 mg | ORAL_TABLET | Freq: Every day | ORAL | Status: DC

## 2012-08-11 NOTE — Progress Notes (Signed)
Patient ID: Kyle Gutierrez, male   DOB: 07/30/1932, 78 y.o.   MRN: [id number]  Patient Active Problem List  Diagnosis  . PERIPHERAL NEUROPATHY  . HYPERTENSION  . ATRIAL FIBRILLATION  . LUMBAR RADICULOPATHY, LEFT  . SKIN CANCER, HX OF  . COLONIC POLYPS, HX OF  . DIVERTICULITIS, HX OF  . Peripheral vascular disease  . Substance abuse  . Thrombocytopenia, unspecified  . Neuropathy, alcoholic  . Pulmonary nodule/lesion, solitary  . Retinopathy  . Screening for colon cancer  . Chronic atrial fibrillation  . Coronary artery disease, non-occlusive  . Carotid artery plaque  . Valvular cardiomyopathy  . renal cell carcinoma  . Benign prostatic hypertrophy  . Hematuria, gross  . Long term (current) use of anticoagulants    Subjective:  CC:   Chief Complaint  Patient presents with  . Follow-up    HPI:   Kyle Gutierrez a 78 y.o. male who presents for followup on chronic issues including atrial fibrillation, hypertension, and hematuria. He has not had urology follow up since   he presented in August with gross hematuria in the setting of history of renal cell CA s/p laparasxopic mass excision in 2002. Patient states that the episode lasted 48 hours and has not recurred..He has had no history of flank pain , abdominal pain,  nephrolithiasis or recent use of NSAIDs.  He has a history of a  benign prostatic hypertrophy status post TURP,  and his PSA was 1.0 in June. He is requesting an alternative to Dr. Lonna Cobb for Urology follow up, preferably in [place]. He is having occasional insomnia.  Although his wife reports snoring there has been no report of apneic breathing patterns. He did see Dr.  Jaynee Eagles for follow up on pulmonary nodules, which were incidental findings and were deemed stable with no follow up needed. He is requesting paper refills of all medications, 90 days bc he is changing pharmacies at his insurance company's bidding.    Past Medical History  Diagnosis  Date  . Hypertension   . Substance abuse     alcohol  . Thrombocytopenia, unspecified   . Neuropathy, alcoholic     per Neurologic eval, remote  . Retinopathy     followed by [facility]  . Chronic atrial fibrillation   . Coronary artery disease, non-occlusive   . Carotid artery plaque     bilateral  . Valvular cardiomyopathy     severe mitral and tricuspid insufficiency, ECHO July 2012  . renal cell carcinoma   . Benign prostatic hypertrophy     s/p TURP    Past Surgical History  Procedure Date  . Transurethral resection of prostate 2003  . Prostate surgery   . Eye surgery     bilateral cataracts removed  . Partial nephrectomy    The following portions of the patient's history were reviewed and updated as appropriate: Allergies, current medications, and problem list.    Review of Systems:   Patient denies headache, fevers, malaise, unintentional weight loss, skin rash, eye pain, sinus congestion and sinus pain, sore throat, dysphagia,  hemoptysis , cough, dyspnea, wheezing, chest pain, palpitations, orthopnea, edema, abdominal pain, nausea, melena, diarrhea, constipation, flank pain, dysuria, hematuria, urinary  Frequency, nocturia, numbness, tingling, seizures,  Focal weakness, Loss of consciousness,  Tremor, insomnia, depression, anxiety, and suicidal ideation.       History   Social History  . Marital Status: Married    Spouse Name: N/A    Number of Children: N/A  . Years of  Education: N/A   Occupational History  . Not on file.   Social History Main Topics  . Smoking status: Former Smoker    Quit date: 03/29/1977  . Smokeless tobacco: Never Used  . Alcohol Use: Yes  . Drug Use: No  . Sexually Active: Not on file   Other Topics Concern  . Not on file   Social History Narrative  . No narrative on file    Objective:  BP 120/64  Pulse 65  Temp 97.7 F (36.5 C) (Oral)  Resp 12  Ht 6\' 2"  (1.88 m)  Wt 200 lb 8 oz (90.946 kg)  BMI 25.74 kg/m2  SpO2  94%  General appearance: alert, cooperative and appears stated age Ears: normal TM's and external ear canals both ears Throat: lips, mucosa, and tongue normal; teeth and gums normal Neck: no adenopathy, no carotid bruit, supple, symmetrical, trachea midline and thyroid not enlarged, symmetric, no tenderness/mass/nodules Back: symmetric, no curvature. ROM normal. No CVA tenderness. Lungs: clear to auscultation bilaterally Heart: regular rate and rhythm, S1, S2 normal, no murmur, click, rub or gallop Abdomen: soft, non-tender; bowel sounds normal; no masses,  no organomegaly Pulses: 2+ and symmetric Skin: Skin color, texture, turgor normal. No rashes or lesions Lymph nodes: Cervical, supraclavicular, and axillary nodes normal.  Assessment and Plan:  Hematuria, gross Repeat urinalysis today was normal. However given his history of renal cell carcinoma and recurrent hematuria the thinking at the think he needs to have regular urology followup and will refer him to Dr. Orson Slick.  Long term (current) use of anticoagulants His INR was therapeutic at 2.5 today on current Coumadin dose. No changes, repeat in 4 weeks.  Pulmonary nodule/lesion, solitary Per patient Dr. Orlie Dakin was released him from further followup due to stability of nodule on serial CT scans  renal cell carcinoma He has 2 stable exophytic lesions on the left kidney by CT scan done in September 2013 at [facility] which have not changed in size but are enhancing and therefore require close followup. Marland Kitchen Referral to Dr. Orson Slick in place for annual follow up.   HYPERTENSION Well controlled on current regimen. Renal function stable, no changes today.   Updated Medication List Outpatient Encounter Prescriptions as of 08/11/2012  Medication Sig Dispense Refill  . losartan-hydrochlorothiazide (HYZAAR) 100-12.5 MG per tablet Take 0.5 tablets by mouth daily. Take 1/2 pill a day  90 tablet  3  . simvastatin (ZOCOR) 40 MG tablet Take 1 tablet  (40 mg total) by mouth at bedtime.  90 tablet  3  . warfarin (COUMADIN) 2.5 MG tablet Take two tablets (5 mg) on Sun, Tue, Wed, Fri, and Sat One tablet (2.5 mg) on Mon, Thurs, Sat.  Or as directed.      . [DISCONTINUED] losartan-hydrochlorothiazide (HYZAAR) 100-12.5 MG per tablet Take 1 tablet by mouth daily.  90 tablet  3  . [DISCONTINUED] losartan-hydrochlorothiazide (HYZAAR) 100-12.5 MG per tablet Take 1 tablet by mouth daily. Take 1/2 pill a day      . [DISCONTINUED] metoprolol succinate (TOPROL XL) 25 MG 24 hr tablet Take 1 tablet (25 mg total) by mouth daily.  90 tablet  3  . [DISCONTINUED] metoprolol succinate (TOPROL XL) 25 MG 24 hr tablet Take 1 tablet (25 mg total) by mouth daily.  90 tablet  3  . [DISCONTINUED] simvastatin (ZOCOR) 40 MG tablet Take 1 tablet (40 mg total) by mouth at bedtime.  90 tablet  3  . [DISCONTINUED] warfarin (COUMADIN) 2.5 MG tablet Take two tablets (  5 mg) on Sun, Mon, Wed, Fri.  One tablet (2.5 mg) on Tues, Thurs, Sat.  Or as directed.  90 tablet  3  . [DISCONTINUED] digoxin (LANOXIN) 0.125 MG tablet Take 1 tablet (125 mcg total) by mouth daily.  90 tablet  3

## 2012-08-14 ENCOUNTER — Ambulatory Visit: Payer: Medicare Other | Admitting: Internal Medicine

## 2012-08-15 ENCOUNTER — Other Ambulatory Visit: Payer: Self-pay | Admitting: Internal Medicine

## 2012-08-15 ENCOUNTER — Encounter: Payer: Self-pay | Admitting: Internal Medicine

## 2012-08-15 NOTE — Assessment & Plan Note (Signed)
Repeat urinalysis today was normal. However given his history of renal cell carcinoma and recurrent hematuria the thinking at the think he needs to have regular urology followup and will refer him to Dr. Orson Slick.

## 2012-08-15 NOTE — Assessment & Plan Note (Addendum)
He has 2 stable exophytic lesions on the left kidney by CT scan done in September 2013 at [facility] which have not changed in size but are enhancing and therefore require close followup. Marland Kitchen Referral to Dr. Orson Slick in place for annual follow up.

## 2012-08-15 NOTE — Assessment & Plan Note (Signed)
Well controlled on current regimen. Renal function stable, no changes today. 

## 2012-08-15 NOTE — Assessment & Plan Note (Signed)
His INR was therapeutic at 2.5 today on current Coumadin dose. No changes, repeat in 4 weeks.

## 2012-08-15 NOTE — Assessment & Plan Note (Signed)
Per patient Dr. Orlie Dakin was released him from further followup due to stability of nodule on serial CT scans

## 2012-08-17 ENCOUNTER — Other Ambulatory Visit: Payer: Self-pay

## 2012-08-17 MED ORDER — WARFARIN SODIUM 2.5 MG PO TABS: ORAL_TABLET | ORAL | Status: DC

## 2012-08-17 NOTE — Telephone Encounter (Signed)
Please advise 

## 2012-08-17 NOTE — Telephone Encounter (Signed)
New Rx request

## 2012-08-27 ENCOUNTER — Encounter: Payer: Self-pay | Admitting: Internal Medicine

## 2012-09-04 ENCOUNTER — Other Ambulatory Visit: Payer: Self-pay | Admitting: Internal Medicine

## 2012-09-04 MED ORDER — METOPROLOL SUCCINATE ER 25 MG PO TB24: 25.0000 mg | ORAL_TABLET | Freq: Every day | ORAL | Status: DC

## 2012-09-04 MED ORDER — WARFARIN SODIUM 2.5 MG PO TABS: ORAL_TABLET | ORAL | Status: DC

## 2012-09-04 MED ORDER — LOSARTAN POTASSIUM-HCTZ 100-12.5 MG PO TABS: 0.5000 | ORAL_TABLET | Freq: Every day | ORAL | Status: DC

## 2012-09-04 MED ORDER — SIMVASTATIN 40 MG PO TABS: 40.0000 mg | ORAL_TABLET | Freq: Every day | ORAL | Status: DC

## 2012-09-04 NOTE — Telephone Encounter (Signed)
MEDICATION SENT IN E-SCRIBED TO [facility]

## 2012-09-04 NOTE — Telephone Encounter (Signed)
Refill Warfarin  Paper in physicians box. The pharmacy is requesting Strength, directions, quantity and refills.

## 2012-09-07 LAB — PROTIME-INR

## 2012-09-08 ENCOUNTER — Telehealth: Payer: Self-pay | Admitting: Internal Medicine

## 2012-09-08 MED ORDER — WARFARIN SODIUM 4 MG PO TABS: 4.0000 mg | ORAL_TABLET | Freq: Every day | ORAL | Status: DC

## 2012-09-08 NOTE — Telephone Encounter (Signed)
Pt notified and Rx sent.

## 2012-09-08 NOTE — Telephone Encounter (Signed)
Coumadin level is high.,  Stop coumadin,  Resume it with a new daily dose of 4 mg starting on Thursday  and repeat inr in 2 weeks.   New rx on printer,  Please call in to pharmacy

## 2012-09-10 ENCOUNTER — Other Ambulatory Visit: Payer: Self-pay | Admitting: Internal Medicine

## 2012-09-10 NOTE — Telephone Encounter (Signed)
Patient is needing a strength put on his warfarin and fax the sheet back to [facility]  The sheet is on your desk Shanda Bumps.

## 2012-09-10 NOTE — Telephone Encounter (Signed)
Meds refaxed

## 2012-09-11 ENCOUNTER — Other Ambulatory Visit: Payer: Self-pay | Admitting: General Practice

## 2012-09-11 MED ORDER — WARFARIN SODIUM 4 MG PO TABS: 4.0000 mg | ORAL_TABLET | Freq: Every day | ORAL | Status: DC

## 2012-09-18 ENCOUNTER — Encounter: Payer: Self-pay | Admitting: Internal Medicine

## 2012-09-22 ENCOUNTER — Telehealth: Payer: Self-pay | Admitting: Internal Medicine

## 2012-09-22 NOTE — Telephone Encounter (Signed)
Pt.notified

## 2012-09-22 NOTE — Telephone Encounter (Signed)
Coumadin level is therapeutic,  Continue current regimen and repeat PT/INR in one month 

## 2012-10-01 ENCOUNTER — Encounter: Payer: Self-pay | Admitting: Internal Medicine

## 2012-10-25 ENCOUNTER — Telehealth: Payer: Self-pay | Admitting: Internal Medicine

## 2012-10-25 NOTE — Telephone Encounter (Signed)
Coumadin level is low.  Please confirm current regimen is 4 mg daily .  If he has not missed any doses,.he needstot increase to  6 mg on Monday and  Wednesday and Saturdays,  4 mg all other days,  Repeat  INR in 2 weeks

## 2012-10-27 NOTE — Telephone Encounter (Signed)
Pt wife advised [place]

## 2012-11-05 ENCOUNTER — Encounter: Payer: Self-pay | Admitting: Internal Medicine

## 2013-01-03 ENCOUNTER — Encounter: Payer: Self-pay | Admitting: Internal Medicine

## 2013-01-04 ENCOUNTER — Telehealth: Payer: Self-pay | Admitting: *Deleted

## 2013-01-04 MED ORDER — SCOPOLAMINE 1 MG/3DAYS TD PT72: 1.0000 | MEDICATED_PATCH | TRANSDERMAL | Status: DC

## 2013-01-04 NOTE — Telephone Encounter (Signed)
Opened in error

## 2013-02-11 ENCOUNTER — Encounter: Payer: Medicare Other | Admitting: Internal Medicine

## 2013-04-05 ENCOUNTER — Encounter: Payer: Medicare Other | Admitting: Internal Medicine

## 2013-04-16 ENCOUNTER — Encounter: Payer: Self-pay | Admitting: Internal Medicine

## 2013-04-16 ENCOUNTER — Ambulatory Visit (INDEPENDENT_AMBULATORY_CARE_PROVIDER_SITE_OTHER): Payer: Medicare Other | Admitting: Internal Medicine

## 2013-04-16 VITALS — BP 134/72 | HR 84 | Temp 97.4°F | Resp 12 | Ht 74.0 in | Wt 206.2 lb

## 2013-04-16 DIAGNOSIS — I4891 Unspecified atrial fibrillation: Secondary | ICD-10-CM

## 2013-04-16 DIAGNOSIS — E538 Deficiency of other specified B group vitamins: Secondary | ICD-10-CM

## 2013-04-16 DIAGNOSIS — Z7901 Long term (current) use of anticoagulants: Secondary | ICD-10-CM

## 2013-04-16 DIAGNOSIS — R29818 Other symptoms and signs involving the nervous system: Secondary | ICD-10-CM

## 2013-04-16 DIAGNOSIS — E559 Vitamin D deficiency, unspecified: Secondary | ICD-10-CM

## 2013-04-16 DIAGNOSIS — Z1211 Encounter for screening for malignant neoplasm of colon: Secondary | ICD-10-CM

## 2013-04-16 DIAGNOSIS — R2689 Other abnormalities of gait and mobility: Secondary | ICD-10-CM

## 2013-04-16 DIAGNOSIS — E785 Hyperlipidemia, unspecified: Secondary | ICD-10-CM

## 2013-04-16 DIAGNOSIS — C801 Malignant (primary) neoplasm, unspecified: Secondary | ICD-10-CM

## 2013-04-16 DIAGNOSIS — Z0000 Encounter for general adult medical examination without abnormal findings: Secondary | ICD-10-CM | POA: Insufficient documentation

## 2013-04-16 DIAGNOSIS — G609 Hereditary and idiopathic neuropathy, unspecified: Secondary | ICD-10-CM

## 2013-04-16 DIAGNOSIS — R5383 Other fatigue: Secondary | ICD-10-CM

## 2013-04-16 DIAGNOSIS — Z79899 Other long term (current) drug therapy: Secondary | ICD-10-CM

## 2013-04-16 DIAGNOSIS — R5381 Other malaise: Secondary | ICD-10-CM

## 2013-04-16 DIAGNOSIS — R911 Solitary pulmonary nodule: Secondary | ICD-10-CM

## 2013-04-16 DIAGNOSIS — Z8719 Personal history of other diseases of the digestive system: Secondary | ICD-10-CM

## 2013-04-16 DIAGNOSIS — R310 Gross hematuria: Secondary | ICD-10-CM

## 2013-04-16 DIAGNOSIS — D696 Thrombocytopenia, unspecified: Secondary | ICD-10-CM

## 2013-04-16 LAB — COMPREHENSIVE METABOLIC PANEL
ALT: 24 U/L (ref 0–53)
AST: 26 U/L (ref 0–37)
Albumin: 4.2 g/dL (ref 3.5–5.2)
Alkaline Phosphatase: 72 U/L (ref 39–117)
Glucose, Bld: 86 mg/dL (ref 70–99)
Potassium: 4.4 mEq/L (ref 3.5–5.1)
Sodium: 139 mEq/L (ref 135–145)
Total Bilirubin: 1.3 mg/dL — ABNORMAL HIGH (ref 0.3–1.2)
Total Protein: 6.8 g/dL (ref 6.0–8.3)

## 2013-04-16 LAB — CBC WITH DIFFERENTIAL/PLATELET
Basophils Absolute: 0 10*3/uL (ref 0.0–0.1)
Eosinophils Absolute: 0.2 10*3/uL (ref 0.0–0.7)
Hemoglobin: 14.7 g/dL (ref 13.0–17.0)
Lymphocytes Relative: 19.3 % (ref 12.0–46.0)
Lymphs Abs: 1.2 10*3/uL (ref 0.7–4.0)
MCHC: 33.7 g/dL (ref 30.0–36.0)
Monocytes Relative: 7.2 % (ref 3.0–12.0)
Neutro Abs: 4.5 10*3/uL (ref 1.4–7.7)
Platelets: 106 10*3/uL — ABNORMAL LOW (ref 150.0–400.0)
RDW: 14.1 % (ref 11.5–14.6)

## 2013-04-16 LAB — TSH: TSH: 1.72 u[IU]/mL (ref 0.35–5.50)

## 2013-04-16 LAB — LIPID PANEL
HDL: 54.5 mg/dL (ref >39.00)
LDL Cholesterol: 70 mg/dL (ref 0–99)
Total CHOL/HDL Ratio: 2
Triglycerides: 58 mg/dL (ref 0.0–149.0)
VLDL: 11.6 mg/dL (ref 0.0–40.0)

## 2013-04-16 MED ORDER — TETANUS-DIPHTH-ACELL PERTUSSIS 5-2.5-18.5 LF-MCG/0.5 IM SUSP: 0.5000 mL | Freq: Once | INTRAMUSCULAR | Status: DC

## 2013-04-16 NOTE — Assessment & Plan Note (Signed)
Annual male exam was done excluding testicular and prostate exam.  All screenings were brought up to date.

## 2013-04-16 NOTE — Assessment & Plan Note (Signed)
His Coumadin regimen is 4 mg daily. His INR is managed by his cardiologist, Dr. Gwen Pounds . However given his upcoming urology evaluation we discussed suspending his Coumadin after Tuesday's dose if there are potential plans for cystoscopy on Friday.

## 2013-04-16 NOTE — Assessment & Plan Note (Signed)
He has been released by Dr. Orlie Dakin for stability of pulmonary nodul by serial  CT scans

## 2013-04-16 NOTE — Assessment & Plan Note (Signed)
Chronic. Previously attributed to effects of alcohol on peripheral  nervous system by prior neurological evaluation negative for neurologic. Patient is requesting followup as it has been several years since he has been seen by neurology.

## 2013-04-16 NOTE — Patient Instructions (Addendum)
Referral for Neurology and vestibular PT underway,  Amber will call you when set up  Call Dr Vevelyn Royals office today about whether to suspend your coumadin prior to you visit next Friday.  If he says yes,  Make your last dose next Tuesday

## 2013-04-16 NOTE — Assessment & Plan Note (Signed)
Recurrent. No prior cystoscopy. Bleeding complicated by use of Coumadin with INR 2-3 for management of embolic stroke risk due to chronic atrial fibrillation .He is scheduled to see a new urologist in [place] next week. Dr. Laverle Patter. He has not been instructed on whether to suspend his Coumadin prior to this appointment. I've advised him to call their office today and if there is a potential for cystoscopy to be done at the first visit, he  need will need to stop his Coumadin with last dose Tuesday for Friday appointment.

## 2013-04-16 NOTE — Assessment & Plan Note (Signed)
His rhythm remains irregular but rate controlled. Continue Coumadin for embolic stroke risk medication.

## 2013-04-16 NOTE — Assessment & Plan Note (Signed)
Recent episode was treated with Cipro and Flagyl by urgent care without hospitalization. His last colonoscopy was in 2008.

## 2013-04-16 NOTE — Progress Notes (Signed)
Patient ID: Kyle Gutierrez, male   DOB: 04/21/1932, 79 y.o.   MRN: [id number]   The patient is here for annual Medicare wellness examination and management of other chronic and acute problems.   Follow up with [facility] since last visit no changes in medications  No procedures. Has had annual visit to Dermatology no cancers.,   Pulmonay nodule: stable by repeat CTs. No longer necessary per [facility]. Gross hematuria: Seeing Borden,  urology in [place]  For follow up for persistent gross hematuria complicated by Gwen Pounds Exercising regularly despite chronic joint pain . Can't take NSAIDs .bc of coumadin .  Not lifestyle limiting  Wears hearing aids,  Tested once yearly with [facility].    seesw dentis twice yearly.   Balance is getting worse. 2 falls in the last 18 months.  Tripped over [place] curb and [place] the [place] right knee and elbow and chin bruised up.  Some bleeding .    2nd [place] more [place] ,  [place]  [place] in the other direction,.  Right hip bruise  From cellphone.  The risk factors are reflected in the social history.  The roster of all physicians providing medical care to patient - is listed in the Snapshot section of the chart.  Activities of daily living:  The patient is 100% independent in all ADLs: dressing, toileting, feeding as well as independent mobility  Home safety : The patient has smoke detectors in the home. They wear seatbelts.  There are no firearms at home. There is no violence in the home.   There is no risks for hepatitis, STDs or HIV. There is no   history of blood transfusion. They have no travel history to infectious disease endemic areas of the world.  The patient has seen their dentist in the last six month. They have seen their eye doctor in the last year. They admit to slight hearing difficulty with regard to whispered voices and some television programs.  They have deferred audiologic testing in the last year.  They do not  have excessive  sun exposure. Discussed the need for sun protection: hats, long sleeves and use of sunscreen if there is significant sun exposure.   Diet: the importance of a healthy diet is discussed. They do have a healthy diet.  The benefits of regular aerobic exercise were discussed. She walks 4 times per week ,  20 minutes.   Depression screen: there are no signs or vegative symptoms of depression- irritability, change in appetite, anhedonia, sadness/tearfullness.  Cognitive assessment: the patient manages all their financial and personal affairs and is actively engaged. They could relate day,date,year and events; recalled 2/3 objects at 3 minutes; performed clock-face test normally.  The following portions of the patient's history were reviewed and updated as appropriate: allergies, current medications, past family history, past medical history,  past surgical history, past social history  and problem list.  Visual acuity was not assessed per patient preference since she has regular follow up with her ophthalmologist. Hearing and body mass index were assessed and reviewed.   During the course of the visit the patient was educated and counseled about appropriate screening and preventive services including : fall prevention , diabetes screening, nutrition counseling, colorectal cancer screening, and recommended immunizations.       Objective:  BP 134/72  Pulse 84  Temp(Src) 97.4 F (36.3 C) (Oral)  Resp 12  Ht 6\' 2"  (1.88 m)  Wt 206 lb 4 oz (93.554 kg)  BMI 26.47 kg/m2  SpO2 98%  BP 134/72  Pulse 84  Temp(Src) 97.4 F (36.3 C) (Oral)  Resp 12  Ht 6\' 2"  (1.88 m)  Wt 206 lb 4 oz (93.554 kg)  BMI 26.47 kg/m2  SpO2 98%  General Appearance:    Alert, cooperative, no distress, appears stated age  Head:    Normocephalic, without obvious abnormality, atraumatic  Eyes:    PERRL, conjunctiva/corneas clear, EOM's intact, fundi    benign, both eyes       Ears:    Normal TM's and external ear  canals, both ears  Nose:   Nares normal, septum midline, mucosa normal, no drainage   or sinus tenderness  Throat:   Lips, mucosa, and tongue normal; teeth and gums normal  Neck:   Supple, symmetrical, trachea midline, no adenopathy;       thyroid:  No enlargement/tenderness/nodules; no carotid   bruit or JVD  Back:     Symmetric, no curvature, ROM normal, no CVA tenderness  Lungs:     Clear to auscultation bilaterally, respirations unlabored  Chest wall:    No tenderness or deformity  Heart:    Regular rate and rhythm, S1 and S2 normal, no murmur, rub   or gallop  Abdomen:     Soft, non-tender, bowel sounds active all four quadrants,    no masses, no organomegaly  Extremities:   Extremities normal, atraumatic, no cyanosis or edema  Pulses:   2+ and symmetric all extremities  Skin:   Skin color, texture, turgor normal, no rashes or lesions  Lymph nodes:   Cervical, supraclavicular, and axillary nodes normal  Neurologic:   CNII-XII intact. Normal strength, sensation and reflexes      throughout   Assessment and Plan:  Hematuria, gross Recurrent. No prior cystoscopy. Bleeding complicated by use of Coumadin with INR 2-3 for management of embolic stroke risk due to chronic atrial fibrillation .He is scheduled to see a new urologist in [place] next week. Dr. Laverle Patter. He has not been instructed on whether to suspend his Coumadin prior to this appointment. I've advised him to call their office today and if there is a potential for cystoscopy to be done at the first visit, he  need will need to stop his Coumadin with last dose Tuesday for Friday appointment.  PERIPHERAL NEUROPATHY Chronic. Previously attributed to effects of alcohol on peripheral  nervous system by prior neurological evaluation negative for neurologic. Patient is requesting followup as it has been several years since he has been seen by neurology.   DIVERTICULITIS, HX OF Recent episode was treated with Cipro and Flagyl by  urgent care without hospitalization. His last colonoscopy was in 2008.  ATRIAL FIBRILLATION His rhythm remains irregular but rate controlled. Continue Coumadin for embolic stroke risk medication.  Long term (current) use of anticoagulants His Coumadin regimen is 4 mg daily. His INR is managed by his cardiologist, Dr. Gwen Pounds . However given his upcoming urology evaluation we discussed suspending his Coumadin after Tuesday's dose if there are potential plans for cystoscopy on Friday.   Pulmonary nodule/lesion, solitary He has been released by Dr. Orlie Dakin for stability of pulmonary nodul by serial  CT scans  Thrombocytopenia, unspecified CBC has not been done in over a year. Given his recurrent gross hematuria I am checking this today.  Routine general medical examination at a health care facility Annual male exam was done excluding testicular and prostate exam.  All screenings were brought up to date.  Updated Medication List Outpatient Encounter Prescriptions as of 04/16/2013  Medication Sig Dispense Refill  . losartan-hydrochlorothiazide (HYZAAR) 100-12.5 MG per tablet Take 0.5 tablets by mouth daily. Take 1/2 pill a day  90 tablet  3  . metoprolol succinate (TOPROL-XL) 25 MG 24 hr tablet Take 12.5 mg by mouth daily.      . simvastatin (ZOCOR) 40 MG tablet Take 1 tablet (40 mg total) by mouth at bedtime.  90 tablet  3  . warfarin (COUMADIN) 4 MG tablet Take 1 tablet (4 mg total) by mouth daily.  30 tablet  1  . [DISCONTINUED] metoprolol succinate (TOPROL XL) 25 MG 24 hr tablet Take 1 tablet (25 mg total) by mouth daily.  90 tablet  3  . TDaP (BOOSTRIX) 5-2.5-18.5 LF-MCG/0.5 injection Inject 0.5 mL into the muscle once.  0.5 mL  0  . [DISCONTINUED] scopolamine (TRANSDERM-SCOP) 1.5 MG Place 1 patch (1.5 mg total) onto the skin every 3 (three) days.  10 patch  12   No facility-administered encounter medications on file as of 04/16/2013.

## 2013-04-16 NOTE — Assessment & Plan Note (Signed)
CBC has not been done in over a year. Given his recurrent gross hematuria I am checking this today.

## 2013-04-17 LAB — VITAMIN D 25 HYDROXY (VIT D DEFICIENCY, FRACTURES): Vit D, 25-Hydroxy: 45 ng/mL (ref 30–89)

## 2013-04-18 ENCOUNTER — Encounter: Payer: Self-pay | Admitting: Internal Medicine

## 2013-04-20 ENCOUNTER — Encounter: Payer: Self-pay | Admitting: Internal Medicine

## 2013-04-20 DIAGNOSIS — G621 Alcoholic polyneuropathy: Secondary | ICD-10-CM

## 2013-04-21 ENCOUNTER — Ambulatory Visit (INDEPENDENT_AMBULATORY_CARE_PROVIDER_SITE_OTHER): Payer: Medicare Other | Admitting: *Deleted

## 2013-04-21 ENCOUNTER — Encounter: Payer: Self-pay | Admitting: Internal Medicine

## 2013-04-21 DIAGNOSIS — Z1211 Encounter for screening for malignant neoplasm of colon: Secondary | ICD-10-CM

## 2013-04-21 LAB — FECAL OCCULT BLOOD, IMMUNOCHEMICAL: Fecal Occult Bld: POSITIVE

## 2013-04-22 ENCOUNTER — Encounter: Payer: Self-pay | Admitting: Internal Medicine

## 2013-04-22 DIAGNOSIS — R195 Other fecal abnormalities: Secondary | ICD-10-CM

## 2013-04-26 ENCOUNTER — Other Ambulatory Visit: Payer: Self-pay | Admitting: Urology

## 2013-04-28 ENCOUNTER — Encounter (HOSPITAL_COMMUNITY): Payer: Self-pay | Admitting: Pharmacy Technician

## 2013-04-29 ENCOUNTER — Ambulatory Visit (HOSPITAL_COMMUNITY)
Admission: RE | Admit: 2013-04-29 | Discharge: 2013-04-29 | Disposition: A | Discharge status: Home / Self Care | Payer: Medicare Other | Source: Ambulatory Visit | Attending: Urology | Admitting: Urology

## 2013-04-29 ENCOUNTER — Encounter (HOSPITAL_COMMUNITY): Payer: Self-pay

## 2013-04-29 ENCOUNTER — Encounter (HOSPITAL_COMMUNITY)
Admission: RE | Admit: 2013-04-29 | Discharge: 2013-04-29 | Disposition: A | Discharge status: Home / Self Care | Payer: Medicare Other | Source: Ambulatory Visit | Attending: Urology | Admitting: Urology

## 2013-04-29 DIAGNOSIS — Z01812 Encounter for preprocedural laboratory examination: Secondary | ICD-10-CM | POA: Insufficient documentation

## 2013-04-29 DIAGNOSIS — Z01818 Encounter for other preprocedural examination: Secondary | ICD-10-CM | POA: Insufficient documentation

## 2013-04-29 DIAGNOSIS — R310 Gross hematuria: Secondary | ICD-10-CM | POA: Insufficient documentation

## 2013-04-29 DIAGNOSIS — N35919 Unspecified urethral stricture, male, unspecified site: Secondary | ICD-10-CM | POA: Insufficient documentation

## 2013-04-29 DIAGNOSIS — I517 Cardiomegaly: Secondary | ICD-10-CM | POA: Insufficient documentation

## 2013-04-29 LAB — CBC
Platelets: 121 10*3/uL — ABNORMAL LOW (ref 150–400)
RBC: 5.13 MIL/uL (ref 4.22–5.81)
WBC: 6.4 10*3/uL (ref 4.0–10.5)

## 2013-04-29 LAB — BASIC METABOLIC PANEL
BUN: 23 mg/dL (ref 6–23)
CO2: 30 mEq/L (ref 19–32)
Chloride: 103 mEq/L (ref 96–112)
Creatinine, Ser: 1.31 mg/dL (ref 0.50–1.35)
Potassium: 4.4 mEq/L (ref 3.5–5.1)

## 2013-04-29 NOTE — Pre-Procedure Instructions (Addendum)
04-29-13  EKG9'13-report with chart. Echo 0'96 -report with chart. Stress 05-11-12-report with chart. 04-30-13 0835 labs results faxed to Dr. Vevelyn Royals office. W. Fatime Biswell,RN 05-05-13 1540 patient call to inform me of a fluid retention issue caused swelling waist level down, required a return to hospital visit after having surgery -12 yrs ago(just wanted [place] to know). Denied any breathing problems at that time. W. Kennon Portela

## 2013-04-29 NOTE — Patient Instructions (Addendum)
22 Kyle Gutierrez  04/29/2013   Your procedure is scheduled on:  9-4 -2014  Report to [facility] at 1000       AM.  Call this number if you have problems the morning of surgery: [phone]  Or Presurgical Testing [phone](Alesia Oshields)       Do not eat food:After Midnight.    Take these medicines the morning of surgery with A SIP OF WATER: Metoprolol. Simvastatin. Stop Coumadin x 5 days prior.   Do not wear jewelry, make-up or nail polish.  Do not wear lotions, powders, or perfumes. You may wear deodorant.  Do not shave 12 hours prior to first CHG shower(legs and under arms).(face and neck okay.)  Do not bring valuables to the hospital.  Contacts, dentures or bridgework,body piercing,  may not be worn into surgery.  Leave suitcase in the car. After surgery it may be brought to your room.  For patients admitted to the hospital, checkout time is 11:00 AM the day of discharge.   Patients discharged the day of surgery will not be allowed to drive home. Must have responsible person with you x 24 hours once discharged.  Name and phone number of your driverJanet Keeter [phone]   Special Instructions: CHG(Chlorhedine 4%-"Hibiclens","Betasept","Aplicare") Shower Use Special Wash: see special instructions.(avoid face and genitals)   Failure to follow these instructions may result in Cancellation of your surgery.   Patient signature_______________________________________________________

## 2013-04-30 NOTE — Progress Notes (Signed)
04-30-13 0835 Labs viewable in [place], PT will be repeated AM of per anesthesia guidelines.

## 2013-05-06 ENCOUNTER — Encounter: Payer: Self-pay | Admitting: Neurology

## 2013-05-06 ENCOUNTER — Encounter (HOSPITAL_COMMUNITY): Payer: Self-pay | Admitting: Anesthesiology

## 2013-05-06 ENCOUNTER — Encounter (HOSPITAL_COMMUNITY): Payer: Self-pay | Admitting: *Deleted

## 2013-05-06 ENCOUNTER — Ambulatory Visit (HOSPITAL_COMMUNITY)
Admission: RE | Admit: 2013-05-06 | Discharge: 2013-05-06 | Disposition: A | Discharge status: Home / Self Care | Payer: Medicare Other | Source: Ambulatory Visit | Attending: Urology | Admitting: Urology

## 2013-05-06 ENCOUNTER — Encounter (HOSPITAL_COMMUNITY)
Admission: RE | Disposition: A | Discharge status: Home / Self Care | Payer: Self-pay | Source: Ambulatory Visit | Attending: Urology

## 2013-05-06 ENCOUNTER — Ambulatory Visit (HOSPITAL_COMMUNITY): Payer: Medicare Other | Admitting: Anesthesiology

## 2013-05-06 ENCOUNTER — Ambulatory Visit (HOSPITAL_COMMUNITY): Payer: Medicare Other

## 2013-05-06 DIAGNOSIS — Z905 Acquired absence of kidney: Secondary | ICD-10-CM | POA: Insufficient documentation

## 2013-05-06 DIAGNOSIS — I10 Essential (primary) hypertension: Secondary | ICD-10-CM | POA: Insufficient documentation

## 2013-05-06 DIAGNOSIS — Z9079 Acquired absence of other genital organ(s): Secondary | ICD-10-CM | POA: Insufficient documentation

## 2013-05-06 DIAGNOSIS — R319 Hematuria, unspecified: Secondary | ICD-10-CM | POA: Insufficient documentation

## 2013-05-06 DIAGNOSIS — Z79899 Other long term (current) drug therapy: Secondary | ICD-10-CM | POA: Insufficient documentation

## 2013-05-06 DIAGNOSIS — G4730 Sleep apnea, unspecified: Secondary | ICD-10-CM | POA: Insufficient documentation

## 2013-05-06 DIAGNOSIS — R310 Gross hematuria: Secondary | ICD-10-CM | POA: Insufficient documentation

## 2013-05-06 DIAGNOSIS — N35919 Unspecified urethral stricture, male, unspecified site: Secondary | ICD-10-CM | POA: Insufficient documentation

## 2013-05-06 DIAGNOSIS — I4891 Unspecified atrial fibrillation: Secondary | ICD-10-CM | POA: Insufficient documentation

## 2013-05-06 DIAGNOSIS — N400 Enlarged prostate without lower urinary tract symptoms: Secondary | ICD-10-CM | POA: Insufficient documentation

## 2013-05-06 DIAGNOSIS — R911 Solitary pulmonary nodule: Secondary | ICD-10-CM | POA: Insufficient documentation

## 2013-05-06 DIAGNOSIS — Z85528 Personal history of other malignant neoplasm of kidney: Secondary | ICD-10-CM | POA: Insufficient documentation

## 2013-05-06 DIAGNOSIS — Z7901 Long term (current) use of anticoagulants: Secondary | ICD-10-CM | POA: Insufficient documentation

## 2013-05-06 HISTORY — PX: CYSTOSCOPY W/ RETROGRADES: SHX1426

## 2013-05-06 LAB — PROTIME-INR
INR: 1.11 (ref 0.00–1.49)
Prothrombin Time: 14.1 seconds (ref 11.6–15.2)

## 2013-05-06 SURGERY — CYSTOSCOPY, WITH RETROGRADE PYELOGRAM
Anesthesia: General | Wound class: Clean Contaminated

## 2013-05-06 MED ORDER — PHENAZOPYRIDINE HCL 100 MG PO TABS: 100.0000 mg | ORAL_TABLET | Freq: Three times a day (TID) | ORAL | Status: DC | PRN

## 2013-05-06 MED ORDER — CIPROFLOXACIN IN D5W 400 MG/200ML IV SOLN
INTRAVENOUS | Status: AC
  Filled 2013-05-06: qty 200

## 2013-05-06 MED ORDER — MEPERIDINE HCL 50 MG/ML IJ SOLN: 6.2500 mg | INTRAMUSCULAR | Status: DC | PRN

## 2013-05-06 MED ORDER — PROMETHAZINE HCL 25 MG/ML IJ SOLN: 6.2500 mg | INTRAMUSCULAR | Status: DC | PRN

## 2013-05-06 MED ORDER — FENTANYL CITRATE 0.05 MG/ML IJ SOLN: 25.0000 ug | INTRAMUSCULAR | Status: DC | PRN

## 2013-05-06 MED ORDER — CIPROFLOXACIN IN D5W 400 MG/200ML IV SOLN
400.0000 mg | INTRAVENOUS | Status: AC
  Administered 2013-05-06: 400 mg via INTRAVENOUS

## 2013-05-06 MED ORDER — PHENAZOPYRIDINE HCL 100 MG PO TABS
100.0000 mg | ORAL_TABLET | Freq: Three times a day (TID) | ORAL | Status: DC
  Filled 2013-05-06 (×4): qty 1

## 2013-05-06 MED ORDER — FENTANYL CITRATE 0.05 MG/ML IJ SOLN
INTRAMUSCULAR | Status: DC | PRN
  Administered 2013-05-06 (×2): 25 ug via INTRAVENOUS

## 2013-05-06 MED ORDER — STERILE WATER FOR IRRIGATION IR SOLN
Status: DC | PRN
  Administered 2013-05-06: 3000 mL

## 2013-05-06 MED ORDER — HYDROCODONE-ACETAMINOPHEN 5-325 MG PO TABS: 1.0000 | ORAL_TABLET | Freq: Four times a day (QID) | ORAL | Status: DC | PRN

## 2013-05-06 MED ORDER — LACTATED RINGERS IV SOLN
INTRAVENOUS | Status: DC
  Administered 2013-05-06: 1000 mL via INTRAVENOUS

## 2013-05-06 MED ORDER — IOHEXOL 300 MG/ML  SOLN
INTRAMUSCULAR | Status: DC | PRN
  Administered 2013-05-06: 20 mL

## 2013-05-06 MED ORDER — LIDOCAINE HCL (CARDIAC) 20 MG/ML IV SOLN
INTRAVENOUS | Status: DC | PRN
  Administered 2013-05-06: 50 mg via INTRAVENOUS

## 2013-05-06 MED ORDER — 0.9 % SODIUM CHLORIDE (POUR BTL) OPTIME
TOPICAL | Status: DC | PRN
  Administered 2013-05-06: 1000 mL

## 2013-05-06 MED ORDER — IOHEXOL 300 MG/ML  SOLN
INTRAMUSCULAR | Status: AC
  Filled 2013-05-06: qty 1

## 2013-05-06 MED ORDER — PROPOFOL 10 MG/ML IV BOLUS
INTRAVENOUS | Status: DC | PRN
  Administered 2013-05-06: 150 mg via INTRAVENOUS

## 2013-05-06 SURGICAL SUPPLY — 17 items
BAG URINE DRAINAGE (UROLOGICAL SUPPLIES) ×2 IMPLANT
BAG URO CATCHER STRL LF (DRAPE) ×2 IMPLANT
BALLN NEPHROSTOMY (BALLOONS) ×2
BALLOON NEPHROSTOMY (BALLOONS) ×1 IMPLANT
CATH INTERMIT  6FR 70CM (CATHETERS) ×2 IMPLANT
CATH TIEMANN FOLEY 18FR 5CC (CATHETERS) ×2 IMPLANT
CLOTH BEACON ORANGE TIMEOUT ST (SAFETY) ×2 IMPLANT
DRAPE CAMERA CLOSED 9X96 (DRAPES) ×2 IMPLANT
GLOVE BIOGEL M STRL SZ7.5 (GLOVE) ×2 IMPLANT
GOWN STRL NON-REIN LRG LVL3 (GOWN DISPOSABLE) ×2 IMPLANT
GOWN STRL REIN XL XLG (GOWN DISPOSABLE) ×2 IMPLANT
GUIDEWIRE STR DUAL SENSOR (WIRE) ×2 IMPLANT
HOLDER FOLEY CATH W/STRAP (MISCELLANEOUS) ×2 IMPLANT
MANIFOLD NEPTUNE II (INSTRUMENTS) ×2 IMPLANT
NS IRRIG 1000ML POUR BTL (IV SOLUTION) IMPLANT
PACK CYSTO (CUSTOM PROCEDURE TRAY) ×2 IMPLANT
TUBING CONNECTING 10 (TUBING) ×2 IMPLANT

## 2013-05-06 NOTE — Transfer of Care (Signed)
Immediate Anesthesia Transfer of Care Note  Patient: Kyle Gutierrez  Procedure(s) Performed: Procedure(s) (LRB): CYSTOSCOPY WITH RETROGRADE PYELOGRAM,  BALLOON DILATION OF URETHRAL STRICTURE (N/A)  Patient Location: PACU  Anesthesia Type: General  Level of Consciousness: sedated, patient cooperative and responds to stimulaton  Airway & Oxygen Therapy: Patient Spontanous Breathing and Patient connected to face mask oxgen  Post-op Assessment: Report given to PACU RN and Post -op Vital signs reviewed and stable  Post vital signs: Reviewed and stable  Complications: No apparent anesthesia complications

## 2013-05-06 NOTE — Interval H&P Note (Signed)
History and Physical Interval Note:  05/06/2013 10:58 AM  Kyle Gutierrez  has presented today for surgery, with the diagnosis of URETHRAL STRICTURE, HEMATURIA  The various methods of treatment have been discussed with the patient and family. After consideration of risks, benefits and other options for treatment, the patient has consented to  Procedure(s) with comments: CYSTOSCOPY WITH RETROGRADE PYELOGRAM  ALSO BALLOON DILATION OF URETHRAL STRICTURE (N/A) - 1 HR [phone] [id number] as a surgical intervention .  The patient's history has been reviewed, patient examined, no change in status, stable for surgery.  I have reviewed the patient's chart and labs.  Questions were answered to the patient's satisfaction.     Kinaya Hilliker,LES

## 2013-05-06 NOTE — H&P (Signed)
Chief Complaint  Gross hematuria   History of Present Illness  Kyle Gutierrez is a 79 year old gentleman seen today as a new patient at the request of Dr. Duncan Dull.  He does have a history of renal cell carcinoma and underwent a laparoscopic left partial nephrectomy in 2002 by Dr. Satira Mccallum at [facility].  He has been disease-free since.  His most recent imaging study was actually performed in December 2012 there was some mention of hypoattenuating small bilateral renal masses likely representing renal cysts.  He also currently has been diagnosed with a small pulmonary nodule and this had been followed by medical oncology, Dr. Orlie Dakin.  After following this for a period of time, he was told that this was benign.  He also has a history of BPH and is status post a TURP in 2003.  His voiding symptoms were significantly improved after surgery although he has had some mild worsening over the past few years.  Overall, his symptoms are not particular bothersome.  His main reason for presenting today was the development of intermittent gross hematuria over the past couple months.  This has been painless and he specifically has been denied other associated symptoms such as dysuria, flank pain, fever, urgency, or frequency.  He has a very distant history of tobacco use and did smoke for 25 years but has quit for over 35 years.  There is no family history of GU malignancy.  He has not previously undergone an evaluation although states that he has been told that he has had some blood in his urine even as far back as 7-8 years ago.  ** His medical comorbidities include a history of atrial fibrillation managed with chronic anticoagulation with Coumadin.   Past Medical History Problems  1. History of  Adult Sleep Apnea 780.57 2. History of  Atrial Fibrillation 427.31 3. History of  Hyperlipidemia 272.4 4. History of  Hypertension 401.9 5. History of  Kidney Cancer V10.52 6. History of  Sinus  Arrhythmia 427.9  Surgical History Problems  1. History of  Partial Nephrectomy Left 2. History of  Transurethral Resection Of Prostate (TURP)  Current Meds 1. Losartan Potassium-HCTZ 100-12.5 MG Oral Tablet; Therapy: (Recorded:22Aug2014) to 2. Metoprolol Tartrate 25 MG Oral Tablet; Therapy: (Recorded:22Aug2014) to 3. Simvastatin 40 MG Oral Tablet; Therapy: (Recorded:22Aug2014) to 4. Warfarin Sodium 2 MG Oral Tablet; Therapy: (Recorded:22Aug2014) to  Allergies Medication  1. No Known Drug Allergies  Family History Problems  1. Family history of  Cancer 2. Family history of  Heart Disease V17.49  Social History Problems    Alcohol Use   Former Smoker V15.82 smoked up to 3 packs a day for 25 years - quit smoking around 1979   Marital History - Currently Married  Review of Systems Constitutional, skin, eye, otolaryngeal, hematologic/lymphatic, cardiovascular, pulmonary, endocrine, musculoskeletal, gastrointestinal, neurological and psychiatric system(s) were reviewed and pertinent findings if present are noted.  Hematologic/Lymphatic: a tendency to easily bruise.  Cardiovascular: leg swelling.  Musculoskeletal: back pain and joint pain.  Neurological: dizziness.    Vitals Vital Signs [Data Includes: Last 1 Day]  22Aug2014 09:31AM  BMI Calculated: 26.18 BSA Calculated: 2.19 Height: 6 ft 2 in Weight: 204 lb  Blood Pressure: 148 / 75 Heart Rate: 60  Physical Exam Constitutional: Well nourished and well developed . No acute distress.  ENT:. The ears and nose are normal in appearance.  Neck: The appearance of the neck is normal and no neck mass is present.  Pulmonary: No respiratory distress,  normal respiratory rhythm and effort and clear bilateral breath sounds.  Cardiovascular:. No peripheral edema. The rhythm was irregularly irregular.  Abdomen: The abdomen is soft and nontender. No masses are palpated. No CVA tenderness. No hernias are palpable. No  hepatosplenomegaly noted.  Rectal: Rectal exam demonstrates normal sphincter tone, no tenderness and no masses. Prostate size is estimated to be 50 g. The prostate has no nodularity and is not tender. The left seminal vesicle is nonpalpable. The right seminal vesicle is nonpalpable. The perineum is normal on inspection.  Genitourinary: Examination of the penis demonstrates no discharge, no masses, no lesions and a normal meatus. The scrotum is without lesions. The right epididymis is palpably normal and non-tender. The left epididymis is palpably normal and non-tender. The right testis is non-tender and without masses. The left testis is non-tender and without masses.  Lymphatics: The supraclavicular, femoral and inguinal nodes are not enlarged or tender.  Skin: Normal skin turgor, no visible rash and no visible skin lesions.  Neuro/Psych:. Mood and affect are appropriate.    Results/Data Urine [Data Includes: Last 1 Day]   22Aug2014  COLOR YELLOW   APPEARANCE CLEAR   SPECIFIC GRAVITY 1.025   pH 5.5   GLUCOSE NEG mg/dL  BILIRUBIN NEG   KETONE NEG mg/dL  BLOOD TRACE   PROTEIN NEG mg/dL  UROBILINOGEN 0.2 mg/dL  NITRITE NEG   LEUKOCYTE ESTERASE TRACE   SQUAMOUS EPITHELIAL/HPF RARE   WBC 0-2 WBC/hpf  RBC 0-2 RBC/hpf  BACTERIA NONE SEEN   CRYSTALS NONE SEEN   CASTS NONE SEEN   Selected Results  CREATININE with eGFR 22Aug2014 11:02AM Kyle Gutierrez  SPECIMEN TYPE: BLOOD   Test Name Result Flag Reference  CREATININE 1.24 mg/dL  1.61-0.96  Est GFR, African American 64 mL/min    Est GFR, NonAfrican American 55 mL/min L   THE ESTIMATED GFR IS A CALCULATION VALID FOR ADULTS (>=47 YEARS OLD) THAT USES THE CKD-EPI ALGORITHM TO ADJUST FOR AGE AND SEX. IT IS   NOT TO BE USED FOR CHILDREN, PREGNANT WOMEN, HOSPITALIZED PATIENTS,    PATIENTS ON DIALYSIS, OR WITH RAPIDLY CHANGING KIDNEY FUNCTION. ACCORDING TO THE NKDEP, EGFR >89 IS NORMAL, 60-89 SHOWS MILD IMPAIRMENT, 30-59 SHOWS MODERATE  IMPAIRMENT, 15-29 SHOWS SEVERE IMPAIRMENT AND <15 IS ESRD.   Procedure  Considering his gross hematuria, we discussed proceeding with cystoscopic evaluation today.  He was prepped and draped in the usual sterile fashion after giving consent.  2% lidocaine jelly was inserted intraurethrally.  I then placed the flexible cystoscope and he was noted to have a small bulbar urethral stricture which did not allow passage of the cystoscope into the bladder.  I discussed options with the patient which included dilation with filiform and followers.  He was agreeable to proceed and I attempted to place 2 filiforms.  The filiforms did not pass easily and he did have some urethral bleeding.  Considering that he was on anticoagulation, I stopped the procedure at this point.     Assessment Assessed  1. Renal Cell Carcinoma 189.0 2. Benign Prostatic Hyperplasia Localized With Urinary Obstruction With Other Lower Urinary Tract  Symptoms 600.21 3. Gross Hematuria 599.71 4. Urethral Stricture 598.9  Plan Gross Hematuria (599.71)  1. CREATININE with eGFR  Done: 22Aug2014 11:02AM 2. Cysto  Requested for: 22Aug2014 3. VENIPUNCTURE  Done: 22Aug2014 4. Follow-up Schedule Surgery Office  Follow-up  Done: 22Aug2014 Health Maintenance (V70.0)  5. UA With REFLEX  Done: 22Aug2014 09:09AM  Discussion/Summary  1.  Gross hematuria:  I have recommended a full urologic evaluation including CT imaging of the abdomen and pelvis and cystoscopy.  Considering that I am unable to perform cystoscopy in the office due to his urethral stricture, I have recommended that we proceed with cystoscopy, balloon dilation of his urethral stricture, and any additional necessary procedures such as resection of any bladder tumors that would be identified or retrograde pyelography if necessary pending his upcoming imaging studies.  We've reviewed all possibilities and discussed the potential risks, complications, and alternative options as well as  the expected recovery process.  He is agreeable to proceed.  2.  Urethral stricture: He will undergo balloon dilation.  We discussed the potential risks and the possibility of developing incontinence at least on a temporary basis.  3.  Renal cell carcinoma: I reassured him that he does not require continued surveillance imaging based on current guidelines.  Cc: Dr. Duncan Dull     Signatures Electronically signed by : Kyle Gutierrez, M.D.; Apr 23 2013  5:24PM

## 2013-05-06 NOTE — Op Note (Signed)
Preoperative diagnosis:  1. Urethral stricture 2. Gross Hematuria 3. BPH  Postoperative diagnosis: 1. Urethral stricture 2. Gross hematuria 3. BPH  Procedure(s): 1. Cystoscopy 2. Balloon dilation of urethral stricture 3. Bilateral retrograde pyelography with interpretation 4. Retrograde urethrography  Surgeon: Dr. Rolly Salter, Jr  Anesthesia: General  Complications: None  EBL: Minimal  Intraoperative findings: Bilateral retrograde pyelography indicated no evidence of filling defects or other abnormalities of the ureters or renal pelves bilaterally. Retrograde urethrography revealed what appeared to be a short < 1 cm bulbar urethral stricture.  Indication: Kyle Gutierrez is a 79 year old who presented with gross hematuria.  During office cystoscopy, he was noted to have a severe bulbar urethral stricture. He also was noted to have significant lower urinary tract symptoms.  In order to further evaluate his hematuria as well as to treat his urinary symptoms, he was recommended to undergo cystoscopy with balloon dilation of his urethral stricture cystoscopy, retrograde urethrogram, probable balloon dilation of his urethral stricture, and retrograde pyelography.  It was discussed that he may need additional procedures depending on his cystoscopic findings.  He gave his informed consent after discussion regarding the potential risks, complications, alternative options, and expected recovery process.  Description of procedure:  He was taken to the operating room and administered general anesthesia. He was given preoperative antibiotics, placed in the dorsal lithotomy position, and prepped and draped in the usual sterile fashion.  Next, a preoperative timeout was performed.  Cystoscopy ureteroscopy was performed.  A severe bulbar urethral stricture was encountered.  A 6 French ureteral catheter was placed through the cystoscope and Omnipaque contrast was injected to perform a retrograde  urethrogram.  This demonstrated a short less than 1 cm stricture and contrast was seen entering the bladder.  A 0.38 sensor guidewire was then advanced into the bladder under fluoroscopic guidance.  A 24 French balloon dilator was then placed over the wire and inflated to 15 atm of pressure and left inflated for 5 minutes.  This was then deflated and removed.  Cystoscopy was then performed next to the wire.  The 22 French cystoscope was able to be easily passed by the stricture and into the bladder.  Findings of his prior TUR defect was noted as well as significant regrowth of his BPH resulting in an irregularly contoured median lobe.  Systematic examination of the bladder revealed the ureteral orifice ease to be in their expected location and effluxing clear urine.   No bladder tumors, stones, or other mucosal pathology was noted within the bladder.  There was mild to moderate trabeculation throughout the bladder consistent with bladder outlet obstruction.  A 6 French ureteral catheter was then used to intubate the left ureteral Omnipaque contrast injected.  No abnormalities were noted as dictated above.  An identical procedure was performed lateral side again with no abnormalities noted on retrograde pyelography.  The cystoscope was then withdrawn and an 18-gauge catheter was placed without difficulty.  This will be left indwelling and removed as an outpatient next week.  The patient tolerated the procedure well without complications.  He was able to be awakened and transferred to the recovery unit in satisfactory condition.

## 2013-05-06 NOTE — Anesthesia Preprocedure Evaluation (Signed)
Anesthesia Evaluation  Patient identified by MRN, date of birth, ID band Patient awake    Reviewed: Allergy & Precautions, H&P , NPO status , Patient's Chart, lab work & pertinent test results  Airway Mallampati: II TM Distance: >3 FB Neck ROM: Full    Dental no notable dental hx.    Pulmonary neg pulmonary ROS,  breath sounds clear to auscultation  Pulmonary exam normal       Cardiovascular hypertension, Pt. on medications + CAD + dysrhythmias Atrial Fibrillation Rhythm:Regular Rate:Normal     Neuro/Psych negative neurological ROS  negative psych ROS   GI/Hepatic negative GI ROS, (+)     substance abuse  alcohol use,   Endo/Other  negative endocrine ROS  Renal/GU negative Renal ROS  negative genitourinary   Musculoskeletal negative musculoskeletal ROS (+)   Abdominal   Peds negative pediatric ROS (+)  Hematology negative hematology ROS (+)   Anesthesia Other Findings   Reproductive/Obstetrics negative OB ROS                           Anesthesia Physical Anesthesia Plan  ASA: IV  Anesthesia Plan: General   Post-op Pain Management:    Induction: Intravenous  Airway Management Planned: LMA  Additional Equipment:   Intra-op Plan:   Post-operative Plan:   Informed Consent: I have reviewed the patients History and Physical, chart, labs and discussed the procedure including the risks, benefits and alternatives for the proposed anesthesia with the patient or authorized representative who has indicated his/her understanding and acceptance.   Dental advisory given  Plan Discussed with:   Anesthesia Plan Comments:         Anesthesia Quick Evaluation

## 2013-05-06 NOTE — Anesthesia Postprocedure Evaluation (Signed)
  Anesthesia Post-op Note  Patient: Kyle Gutierrez  Procedure(s) Performed: Procedure(s) (LRB): CYSTOSCOPY WITH RETROGRADE PYELOGRAM,  BALLOON DILATION OF URETHRAL STRICTURE (N/A)  Patient Location: PACU  Anesthesia Type: General  Level of Consciousness: awake and alert   Airway and Oxygen Therapy: Patient Spontanous Breathing  Post-op Pain: mild  Post-op Assessment: Post-op Vital signs reviewed, Patient's Cardiovascular Status Stable, Respiratory Function Stable, Patent Airway and No signs of Nausea or vomiting  Last Vitals:  Filed Vitals:   05/06/13 1327  BP: 126/80  Pulse: 61  Temp: 36.3 C  Resp: 18    Post-op Vital Signs: stable   Complications: No apparent anesthesia complications

## 2013-05-07 ENCOUNTER — Encounter (HOSPITAL_COMMUNITY): Payer: Self-pay | Admitting: Urology

## 2013-05-10 ENCOUNTER — Institutional Professional Consult (permissible substitution): Payer: Self-pay | Admitting: Neurology

## 2013-05-11 ENCOUNTER — Emergency Department: Payer: Self-pay | Admitting: Emergency Medicine

## 2013-05-11 LAB — COMPREHENSIVE METABOLIC PANEL
Albumin: 3.4 g/dL (ref 3.4–5.0)
Anion Gap: 9 (ref 7–16)
BUN: 28 mg/dL — ABNORMAL HIGH (ref 7–18)
Bilirubin,Total: 1.3 mg/dL — ABNORMAL HIGH (ref 0.2–1.0)
Chloride: 109 mmol/L — ABNORMAL HIGH (ref 98–107)
EGFR (African American): 60 — ABNORMAL LOW
Glucose: 101 mg/dL — ABNORMAL HIGH (ref 65–99)
SGPT (ALT): 26 U/L (ref 12–78)
Total Protein: 6.1 g/dL — ABNORMAL LOW (ref 6.4–8.2)

## 2013-05-11 LAB — CBC WITH DIFFERENTIAL/PLATELET
Basophil #: 0 10*3/uL (ref 0.0–0.1)
Basophil %: 0.1 %
Eosinophil #: 0 10*3/uL (ref 0.0–0.7)
Eosinophil %: 0.4 %
HCT: 39.5 % — ABNORMAL LOW (ref 40.0–52.0)
Lymphocyte %: 3 %
MCH: 31.7 pg (ref 26.0–34.0)
MCHC: 34.5 g/dL (ref 32.0–36.0)
MCV: 92 fL (ref 80–100)
Monocyte #: 0 x10 3/mm — ABNORMAL LOW (ref 0.2–1.0)
Monocyte %: 0.8 %
Neutrophil #: 6.4 10*3/uL (ref 1.4–6.5)
Neutrophil %: 95.7 %
WBC: 6.6 10*3/uL (ref 3.8–10.6)

## 2013-05-11 LAB — URINALYSIS, COMPLETE
Bilirubin,UR: NEGATIVE
Protein: 30
RBC,UR: 70 /HPF (ref 0–5)

## 2013-05-11 LAB — PROTIME-INR: INR: 1.2

## 2013-05-12 ENCOUNTER — Telehealth: Payer: Self-pay | Admitting: Internal Medicine

## 2013-05-12 ENCOUNTER — Inpatient Hospital Stay: Payer: Self-pay | Admitting: Internal Medicine

## 2013-05-12 LAB — CBC WITH DIFFERENTIAL/PLATELET
Basophil #: 0 10*3/uL (ref 0.0–0.1)
Eosinophil #: 0 10*3/uL (ref 0.0–0.7)
Eosinophil %: 0.2 %
HCT: 38.2 % — ABNORMAL LOW (ref 40.0–52.0)
Lymphocyte %: 4.8 %
MCHC: 34.7 g/dL (ref 32.0–36.0)
MCV: 91 fL (ref 80–100)
Monocyte #: 0.6 x10 3/mm (ref 0.2–1.0)
Monocyte %: 6.2 %
Neutrophil #: 8.5 10*3/uL — ABNORMAL HIGH (ref 1.4–6.5)
Platelet: 83 10*3/uL — ABNORMAL LOW (ref 150–440)
RBC: 4.19 10*6/uL — ABNORMAL LOW (ref 4.40–5.90)
WBC: 9.6 10*3/uL (ref 3.8–10.6)

## 2013-05-12 LAB — URINALYSIS, COMPLETE
Bilirubin,UR: NEGATIVE
Glucose,UR: NEGATIVE mg/dL (ref 0–75)
Ketone: NEGATIVE
Nitrite: NEGATIVE
Ph: 5 (ref 4.5–8.0)
Protein: NEGATIVE
Specific Gravity: 1.011 (ref 1.003–1.030)
Squamous Epithelial: <1

## 2013-05-12 LAB — BASIC METABOLIC PANEL
Calcium, Total: 8.8 mg/dL (ref 8.5–10.1)
Chloride: 109 mmol/L — ABNORMAL HIGH (ref 98–107)
Co2: 23 mmol/L (ref 21–32)
Creatinine: 1.31 mg/dL — ABNORMAL HIGH (ref 0.60–1.30)
EGFR (African American): 60 — ABNORMAL LOW
EGFR (Non-African Amer.): 51 — ABNORMAL LOW
Glucose: 143 mg/dL — ABNORMAL HIGH (ref 65–99)
Potassium: 3.6 mmol/L (ref 3.5–5.1)
Sodium: 139 mmol/L (ref 136–145)

## 2013-05-12 LAB — PROTIME-INR
INR: 1.5
Prothrombin Time: 18.3 secs — ABNORMAL HIGH (ref 11.5–14.7)

## 2013-05-12 NOTE — Telephone Encounter (Signed)
The patient is in the hospital room 130. She just wanted Dr. Darrick Huntsman to know in order to talk with the hospitalist .

## 2013-05-13 LAB — BASIC METABOLIC PANEL
Calcium, Total: 8.8 mg/dL (ref 8.5–10.1)
Chloride: 108 mmol/L — ABNORMAL HIGH (ref 98–107)
Co2: 25 mmol/L (ref 21–32)
Creatinine: 1.29 mg/dL (ref 0.60–1.30)
EGFR (Non-African Amer.): 52 — ABNORMAL LOW
Glucose: 92 mg/dL (ref 65–99)
Osmolality: 281 (ref 275–301)
Potassium: 3.8 mmol/L (ref 3.5–5.1)

## 2013-05-13 LAB — CBC WITH DIFFERENTIAL/PLATELET
Eosinophil #: 0.1 10*3/uL (ref 0.0–0.7)
Eosinophil %: 1.7 %
HCT: 37.5 % — ABNORMAL LOW (ref 40.0–52.0)
MCH: 31.3 pg (ref 26.0–34.0)
Monocyte #: 0.8 x10 3/mm (ref 0.2–1.0)
Monocyte %: 8.8 %
Neutrophil %: 77.8 %
RDW: 13.8 % (ref 11.5–14.5)
WBC: 8.7 10*3/uL (ref 3.8–10.6)

## 2013-05-13 LAB — URINE CULTURE

## 2013-05-13 LAB — PROTIME-INR: Prothrombin Time: 16.2 secs — ABNORMAL HIGH (ref 11.5–14.7)

## 2013-05-13 LAB — MAGNESIUM: Magnesium: 1.8 mg/dL

## 2013-05-14 ENCOUNTER — Ambulatory Visit: Payer: Medicare Other | Admitting: Adult Health

## 2013-05-14 LAB — PROTIME-INR
INR: 1.5 — AB (ref 0.9–1.1)
Prothrombin Time: 17.9 secs — ABNORMAL HIGH (ref 11.5–14.7)

## 2013-05-14 LAB — BASIC METABOLIC PANEL
BUN: 21 mg/dL — ABNORMAL HIGH (ref 7–18)
Calcium, Total: 9 mg/dL (ref 8.5–10.1)
Chloride: 106 mmol/L (ref 98–107)
Co2: 26 mmol/L (ref 21–32)
Creatinine: 1.26 mg/dL (ref 0.60–1.30)
EGFR (African American): >60
EGFR (Non-African Amer.): 54 — ABNORMAL LOW
Glucose: 96 mg/dL (ref 65–99)
Osmolality: 279 (ref 275–301)
Sodium: 138 mmol/L (ref 136–145)

## 2013-05-14 LAB — CBC WITH DIFFERENTIAL/PLATELET
Eosinophil #: 0.3 10*3/uL (ref 0.0–0.7)
Eosinophil %: 3.1 %
HGB: 13.3 g/dL (ref 13.0–18.0)
Lymphocyte #: 1.1 10*3/uL (ref 1.0–3.6)
MCH: 31.4 pg (ref 26.0–34.0)
MCHC: 34.8 g/dL (ref 32.0–36.0)
MCV: 90 fL (ref 80–100)
Monocyte %: 9.4 %
Neutrophil %: 74.5 %
Platelet: 93 10*3/uL — ABNORMAL LOW (ref 150–440)
RDW: 13.5 % (ref 11.5–14.5)

## 2013-05-14 LAB — CULTURE, BLOOD (SINGLE)

## 2013-05-16 LAB — CULTURE, BLOOD (SINGLE)

## 2013-05-19 ENCOUNTER — Telehealth (INDEPENDENT_AMBULATORY_CARE_PROVIDER_SITE_OTHER): Payer: Medicare Other | Admitting: Internal Medicine

## 2013-05-19 ENCOUNTER — Encounter: Payer: Self-pay | Admitting: Internal Medicine

## 2013-05-19 VITALS — BP 134/68 | HR 74 | Temp 97.7°F | Resp 14 | Ht 74.0 in | Wt 202.8 lb

## 2013-05-19 DIAGNOSIS — R7881 Bacteremia: Secondary | ICD-10-CM

## 2013-05-19 DIAGNOSIS — R310 Gross hematuria: Secondary | ICD-10-CM

## 2013-05-19 DIAGNOSIS — A498 Other bacterial infections of unspecified site: Secondary | ICD-10-CM

## 2013-05-19 DIAGNOSIS — Z7901 Long term (current) use of anticoagulants: Secondary | ICD-10-CM

## 2013-05-19 DIAGNOSIS — R0681 Apnea, not elsewhere classified: Secondary | ICD-10-CM

## 2013-05-19 DIAGNOSIS — I10 Essential (primary) hypertension: Secondary | ICD-10-CM

## 2013-05-19 DIAGNOSIS — N179 Acute kidney failure, unspecified: Secondary | ICD-10-CM

## 2013-05-19 DIAGNOSIS — N12 Tubulo-interstitial nephritis, not specified as acute or chronic: Secondary | ICD-10-CM

## 2013-05-19 DIAGNOSIS — B9620 Unspecified Escherichia coli [E. coli] as the cause of diseases classified elsewhere: Secondary | ICD-10-CM

## 2013-05-19 DIAGNOSIS — R0609 Other forms of dyspnea: Secondary | ICD-10-CM

## 2013-05-19 DIAGNOSIS — R0683 Snoring: Secondary | ICD-10-CM

## 2013-05-19 DIAGNOSIS — G253 Myoclonus: Secondary | ICD-10-CM

## 2013-05-19 LAB — COMPREHENSIVE METABOLIC PANEL
CO2: 28 mEq/L (ref 19–32)
Calcium: 9.1 mg/dL (ref 8.4–10.5)
Chloride: 105 mEq/L (ref 96–112)
Creatinine, Ser: 1.3 mg/dL (ref 0.4–1.5)
GFR: 58.11 mL/min — ABNORMAL LOW (ref >60.00)
Glucose, Bld: 85 mg/dL (ref 70–99)
Total Bilirubin: 0.6 mg/dL (ref 0.3–1.2)

## 2013-05-19 LAB — CBC WITH DIFFERENTIAL/PLATELET
Basophils Absolute: 0 10*3/uL (ref 0.0–0.1)
Eosinophils Relative: 3.3 % (ref 0.0–5.0)
HCT: 41.9 % (ref 39.0–52.0)
Hemoglobin: 14.2 g/dL (ref 13.0–17.0)
Lymphocytes Relative: 20.7 % (ref 12.0–46.0)
Lymphs Abs: 1.2 10*3/uL (ref 0.7–4.0)
Monocytes Relative: 8.1 % (ref 3.0–12.0)
Platelets: 162 10*3/uL (ref 150.0–400.0)
RDW: 13.6 % (ref 11.5–14.6)
WBC: 5.8 10*3/uL (ref 4.5–10.5)

## 2013-05-19 LAB — PROTIME-INR: Prothrombin Time: 27 s — ABNORMAL HIGH (ref 10.2–12.4)

## 2013-05-19 NOTE — Progress Notes (Signed)
Patient ID: Kyle Gutierrez, male   DOB: Jan 22, 1932, 79 y.o.   MRN: [id number]   Patient Active Problem List   Diagnosis Date Noted  . Pyelonephritis 05/20/2013  . Routine general medical examination at a health care facility 04/16/2013  . Long term (current) use of anticoagulants 08/11/2012  . Hematuria, gross 04/11/2012  . Chronic atrial fibrillation   . Coronary artery disease, non-occlusive   . Carotid artery plaque   . Valvular cardiomyopathy   . renal cell carcinoma   . Benign prostatic hypertrophy   . Screening for colon cancer 02/14/2012  . Retinopathy   . Pulmonary nodule/lesion, solitary 08/16/2011  . Substance abuse   . Thrombocytopenia, unspecified   . Neuropathy, alcoholic   . Peripheral vascular disease 08/06/2011  . LUMBAR RADICULOPATHY, LEFT 05/02/2009  . PERIPHERAL NEUROPATHY 01/05/2009  . HYPERTENSION 12/05/2008  . ATRIAL FIBRILLATION 12/05/2008  . SKIN CANCER, HX OF 12/05/2008  . COLONIC POLYPS, HX OF 12/05/2008  . DIVERTICULITIS, HX OF 12/05/2008    Subjective:  CC:   Chief Complaint  Patient presents with  . Follow-up    HPI:   Kyle Gutierrez a 79 y.o. male who presents for  Hospital follow up .  Patient Admitted to [facility] on Sept with bacteremia secondary to UTI after having outpatient cystoscopy several days prior.  Patient was scoped and sent home with a Foley catheter, which he wore for  4 days.  It was removed by urology  And several hours later he became febrile with rigors and bilateral CVA pain.  On Tuesday at 3 am   Fever spiked  to 103 by EMS on Sept 9th, taken to [facility] and treated in ED and sent home but called back when blood cultures were positive for GNRs. He received IV abx for several days but sent home on a cephalosporin which he has been taking four times daily.  He is Feeling better but not  Back to baseline.   Gets tired easily,  Appetite ok no diarrhea but stool are more moving more frequently including middle of the night     He has also developed Disrupted sleep.  He has been having myoclonic jerks which start about  an hour or so after falling asleep      Past Medical History  Diagnosis Date  . Hypertension   . Substance abuse     alcohol  . Thrombocytopenia, unspecified   . Neuropathy, alcoholic     per Neurologic eval, remote  . Retinopathy     followed by Kyle Gutierrez  . Chronic atrial fibrillation   . Coronary artery disease, non-occlusive   . Carotid artery plaque     bilateral  . Valvular cardiomyopathy     severe mitral and tricuspid insufficiency, ECHO July 2012  . Benign prostatic hypertrophy     s/p TURP  . renal cell carcinoma     left kidney cancer    Past Surgical History  Procedure Laterality Date  . Transurethral resection of prostate  2003  . Prostate surgery    . Eye surgery      bilateral cataracts removed  . Partial nephrectomy Left     10'02  . Cystoscopy w/ retrogrades N/A 05/06/2013    Procedure: CYSTOSCOPY WITH RETROGRADE PYELOGRAM,  BALLOON DILATION OF URETHRAL STRICTURE;  Surgeon: Kyle Mc, MD;  Location: [facility];  Service: Urology;  Laterality: N/A;       The following portions of the patient's history were reviewed and updated as appropriate: Allergies, current  medications, and problem list.    Review of Systems:   12 Pt  review of systems was negative except those addressed in the HPI,     History   Social History  . Marital Status: Married    Spouse Name: N/A    Number of Children: N/A  . Years of Education: N/A   Occupational History  . Not on file.   Social History Main Topics  . Smoking status: Former Smoker    Quit date: 03/29/1977  . Smokeless tobacco: Never Used  . Alcohol Use: Yes     Comment: daily 5-6 ounces wine  . Drug Use: No  . Sexual Activity: Yes   Other Topics Concern  . Not on file   Social History Narrative  . No narrative on file    Objective:  Filed Vitals:   05/19/13 0953  BP: 134/68  Pulse: 74  Temp:  97.7 F (36.5 C)  Resp: 14     General appearance: alert, cooperative and appears stated age Ears: normal TM's and external ear canals both ears Throat: lips, mucosa, and tongue normal; teeth and gums normal Neck: no adenopathy, no carotid bruit, supple, symmetrical, trachea midline and thyroid not enlarged, symmetric, no tenderness/mass/nodules Back: symmetric, no curvature. ROM normal. No CVA tenderness. Lungs: clear to auscultation bilaterally Heart: regular rate and rhythm, S1, S2 normal, no murmur, click, rub or gallop Abdomen: soft, non-tender; bowel sounds normal; no masses,  no organomegaly Pulses: 2+ and symmetric Skin: Skin color, texture, turgor normal. No rashes or lesions Lymph nodes: Cervical, supraclavicular, and axillary nodes normal.  Assessment and Plan:  Pyelonephritis Resolved ,  Secondary to GNR/E Coli. Will finish Kefles and avoid any future instruementation   Long term (current) use of anticoagulants INR  is therapeutic,  Continue current regimen and repeat PT/INR in one month./    Hematuria, gross Cystoscopy was negative for CA but noted urethral stricture which was dilated ,     HYPERTENSION Well controlled on current regimen. Renal function stable, no changes today.  A total of 40 minutes was spent with patient more than half of which was spent in counseling, reviewing records on Kyle Gutierrez and coordination of care.   Updated Medication List Outpatient Encounter Prescriptions as of 05/19/2013  Medication Sig Dispense Refill  . acetaminophen (TYLENOL) 500 MG tablet Take 500 mg by mouth every 6 (six) hours as needed for pain.      Marland Kitchen losartan-hydrochlorothiazide (HYZAAR) 100-12.5 MG per tablet Take 0.5 tablets by mouth daily.      . metoprolol succinate (TOPROL-XL) 25 MG 24 hr tablet Take 12.5 mg by mouth every morning. Takes 1/2 tablet      . simvastatin (ZOCOR) 40 MG tablet Take 40 mg by mouth every morning.      . warfarin (COUMADIN) 4 MG tablet Take  5 mg by mouth every morning.       Marland Kitchen HYDROcodone-acetaminophen (NORCO/VICODIN) 5-325 MG per tablet Take 1-2 tablets by mouth every 6 (six) hours as needed for pain.  25 tablet  0  . phenazopyridine (PYRIDIUM) 100 MG tablet Take 1 tablet (100 mg total) by mouth 3 (three) times daily as needed for pain (for burning).  20 tablet  0   No facility-administered encounter medications on file as of 05/19/2013.     Orders Placed This Encounter  Procedures  . Blood culture (routine single)  . Micro Review  . Comprehensive metabolic panel  . Protime-INR  . CBC with Differential  . Nocturnal  polysomnography (NPSG)    No Follow-up on file.

## 2013-05-19 NOTE — Patient Instructions (Signed)
I want you to start Culturelle, align or  Floraque/Florastar  immediately to prevent diarrhea from your antibiotics  Check INR today  And kidney function   We are getting a new pneumonia vaccine  (Prevnar)  Soon ;  You need to have it even though you had the other one years ago

## 2013-05-20 ENCOUNTER — Encounter: Payer: Self-pay | Admitting: Internal Medicine

## 2013-05-20 DIAGNOSIS — N12 Tubulo-interstitial nephritis, not specified as acute or chronic: Secondary | ICD-10-CM | POA: Insufficient documentation

## 2013-05-20 DIAGNOSIS — G253 Myoclonus: Secondary | ICD-10-CM | POA: Insufficient documentation

## 2013-05-20 NOTE — Assessment & Plan Note (Addendum)
suspect nocturnal hypoxia.  Wife has insisted that his snoring is significant.  Sleep study ordered.

## 2013-05-20 NOTE — Assessment & Plan Note (Signed)
Well controlled on current regimen. Renal function stable, no changes today. 

## 2013-05-20 NOTE — Assessment & Plan Note (Signed)
INR  is therapeutic,  Continue current regimen and repeat PT/INR in one month./

## 2013-05-20 NOTE — Assessment & Plan Note (Signed)
Resolved ,  Secondary to GNR/E Coli. Will finish Kefles and avoid any future instruementation

## 2013-05-20 NOTE — Assessment & Plan Note (Signed)
Cystoscopy was negative for CA but noted urethral stricture which was dilated ,

## 2013-05-21 ENCOUNTER — Encounter: Payer: Self-pay | Admitting: Internal Medicine

## 2013-05-26 ENCOUNTER — Ambulatory Visit: Payer: Self-pay | Admitting: Internal Medicine

## 2013-06-01 ENCOUNTER — Telehealth: Payer: Self-pay | Admitting: Internal Medicine

## 2013-06-01 DIAGNOSIS — G4733 Obstructive sleep apnea (adult) (pediatric): Secondary | ICD-10-CM

## 2013-06-01 MED ORDER — ZOLPIDEM TARTRATE 5 MG PO TABS: 5.0000 mg | ORAL_TABLET | Freq: Every evening | ORAL | Status: DC | PRN

## 2013-06-01 NOTE — Telephone Encounter (Signed)
His sleep was positive for sleep apnea   He needs to have a CPAP titration study scheduled  Which will be arranged.  They recommended that he take a sleep aid the night of the study so he can fall asleep faster.  I will send an rx for ambien 5 mg to his pharmacy to use the night of the study

## 2013-06-01 NOTE — Telephone Encounter (Signed)
Patient notified and voiced understanding.

## 2013-06-03 ENCOUNTER — Ambulatory Visit: Payer: Self-pay | Admitting: Internal Medicine

## 2013-06-03 ENCOUNTER — Ambulatory Visit (INDEPENDENT_AMBULATORY_CARE_PROVIDER_SITE_OTHER): Payer: Medicare Other | Admitting: Neurology

## 2013-06-03 ENCOUNTER — Encounter (INDEPENDENT_AMBULATORY_CARE_PROVIDER_SITE_OTHER): Payer: Medicare Other

## 2013-06-03 ENCOUNTER — Encounter: Payer: Self-pay | Admitting: Neurology

## 2013-06-03 DIAGNOSIS — G621 Alcoholic polyneuropathy: Secondary | ICD-10-CM

## 2013-06-03 DIAGNOSIS — G63 Polyneuropathy in diseases classified elsewhere: Secondary | ICD-10-CM

## 2013-06-03 NOTE — Procedures (Signed)
  HISTORY:  Kyle Gutierrez is a 79 year old gentleman with a ten-year history of numbness in the feet unassociated with pain. The patient is being reevaluated for the neuropathy. The patient believes that his numbness has gradually worsened over time. The patient denies any significant balance issues or falls. The patient denies back pain or pain radiating down the legs. The patient does report a left footdrop, noted on a prior evaluation.  NERVE CONDUCTION STUDIES:  Nerve conduction studies were performed on the left upper extremity. The distal motor latencies for the left median and ulnar nerves were prolonged, with normal motor amplitudes for these nerves. The F wave latency for the left median nerve was prolonged, normal for the left ulnar nerve. The nerve conduction velocities for the left median and ulnar nerves were normal. The sensory latency for the left median nerve is prolonged, normal for the left ulnar nerve.  Nerve conduction studies were performed on both lower extremities. The distal motor latencies for the peroneal nerves were normal bilaterally, prolonged for the posterior tibial nerves bilaterally. Low motor amplitudes were seen for the peroneal and posterior tibial nerves bilaterally. The nerve conduction velocities for the peroneal nerves were slowed below the knees bilaterally, and slowed above the knee on the right, normal on the left. Nerve conduction velocities for the posterior tibial nerves were normal bilaterally. The H reflex latencies were absent bilaterally, and the peroneal sensory latencies were absent bilaterally.  EMG STUDIES:  EMG study was performed on the right lower extremity:  The tibialis anterior muscle reveals 2 to 6K motor units with decreased recruitment. No fibrillations or positive waves were seen. The peroneus tertius muscle reveals 2 to 8K motor units with decreased recruitment. One plus fibrillations and positive waves were seen. The medial  gastrocnemius muscle reveals 2 to 6K motor units with decreased recruitment. No fibrillations or positive waves were seen. The vastus lateralis muscle reveals 2 to 4K motor units with full recruitment. No fibrillations or positive waves were seen. The iliopsoas muscle reveals 2 to 4K motor units with full recruitment. No fibrillations or positive waves were seen. The biceps femoris muscle (long head) reveals 2 to 4K motor units with full recruitment. No fibrillations or positive waves were seen. The lumbosacral paraspinal muscles were tested at 3 levels, and revealed no abnormalities of insertional activity at all 3 levels tested. There was good relaxation.   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 There is evidence of an overlying left carpal tunnel syndrome of mild severity. There are some mild overlying distal demyelinating features as well. EMG evaluation of the right lower extremity shows findings consistent with a peripheral neuropathy with distal primarily chronic signs of denervation, without evidence of an overlying lumbosacral radiculopathy. In comparison to a prior study done in 2010, there has been mild progression of the peripheral neuropathy on this evaluation.  Marlan Palau MD 06/03/2013 1:39 PM  [facility] [address] [place], [place] [address]  Phone [phone] Fax [phone]

## 2013-06-04 ENCOUNTER — Encounter: Payer: Self-pay | Admitting: Internal Medicine

## 2013-06-09 ENCOUNTER — Telehealth: Payer: Self-pay | Admitting: Internal Medicine

## 2013-06-09 DIAGNOSIS — G4733 Obstructive sleep apnea (adult) (pediatric): Secondary | ICD-10-CM

## 2013-06-09 NOTE — Telephone Encounter (Signed)
His CPAP titration study report has been received. His optimal pressure was 12 cm of water. I have ordered the mask with heated humidifier. A He will need to use his CPAP every night 

## 2013-06-09 NOTE — Assessment & Plan Note (Signed)
His CPAP titration study report has been received. His optimal pressure was 12 cm of water. I have ordered the mask with heated humidifier. A He will need to use his CPAP every night

## 2013-06-10 NOTE — Telephone Encounter (Signed)
Patient notified and voiced understanding.

## 2013-06-14 ENCOUNTER — Encounter: Payer: Self-pay | Admitting: Internal Medicine

## 2013-06-28 ENCOUNTER — Encounter: Payer: Self-pay | Admitting: Internal Medicine

## 2013-07-03 LAB — HM COLONOSCOPY

## 2013-07-13 ENCOUNTER — Ambulatory Visit: Payer: Self-pay | Admitting: Gastroenterology

## 2013-07-14 LAB — PATHOLOGY REPORT

## 2013-08-04 ENCOUNTER — Telehealth: Payer: Self-pay | Admitting: Internal Medicine

## 2013-08-04 DIAGNOSIS — G4733 Obstructive sleep apnea (adult) (pediatric): Secondary | ICD-10-CM

## 2013-08-04 NOTE — Telephone Encounter (Signed)
Patient stated he is confused because [facility] stated that he needs to come back evaluation  Incomplete. Please advise

## 2013-08-04 NOTE — Telephone Encounter (Signed)
His sleep apnea compliance report Aug 02 2013 indicates he is using it every night and averaging > 7 hours of use per night

## 2013-08-04 NOTE — Assessment & Plan Note (Signed)
His compliance report Aug 02 2013 indicates he is using it every night and averaging > 7 hours of use per night

## 2013-08-04 NOTE — Telephone Encounter (Signed)
i do not know what he is talking about.  Call [facility].

## 2013-08-05 NOTE — Telephone Encounter (Signed)
No answer at [facility] left message to call office.

## 2013-08-13 ENCOUNTER — Encounter: Payer: Self-pay | Admitting: Internal Medicine

## 2013-08-13 ENCOUNTER — Ambulatory Visit (INDEPENDENT_AMBULATORY_CARE_PROVIDER_SITE_OTHER): Payer: Medicare Other | Admitting: Internal Medicine

## 2013-08-13 ENCOUNTER — Ambulatory Visit (INDEPENDENT_AMBULATORY_CARE_PROVIDER_SITE_OTHER)
Admission: RE | Admit: 2013-08-13 | Discharge: 2013-08-13 | Disposition: A | Discharge status: Home / Self Care | Payer: Medicare Other | Source: Ambulatory Visit | Attending: Internal Medicine | Admitting: Internal Medicine

## 2013-08-13 ENCOUNTER — Other Ambulatory Visit: Payer: Self-pay | Admitting: Internal Medicine

## 2013-08-13 VITALS — BP 110/60 | HR 90 | Temp 97.1°F | Resp 12 | Wt 210.5 lb

## 2013-08-13 DIAGNOSIS — R059 Cough, unspecified: Secondary | ICD-10-CM

## 2013-08-13 DIAGNOSIS — Z79899 Other long term (current) drug therapy: Secondary | ICD-10-CM

## 2013-08-13 DIAGNOSIS — R05 Cough: Secondary | ICD-10-CM

## 2013-08-13 DIAGNOSIS — G621 Alcoholic polyneuropathy: Secondary | ICD-10-CM

## 2013-08-13 DIAGNOSIS — G4733 Obstructive sleep apnea (adult) (pediatric): Secondary | ICD-10-CM

## 2013-08-13 DIAGNOSIS — Z87891 Personal history of nicotine dependence: Secondary | ICD-10-CM | POA: Insufficient documentation

## 2013-08-13 LAB — COMPREHENSIVE METABOLIC PANEL
ALT: 28 U/L (ref 0–53)
Albumin: 4.2 g/dL (ref 3.5–5.2)
CO2: 30 mEq/L (ref 19–32)
Calcium: 9.4 mg/dL (ref 8.4–10.5)
Chloride: 105 mEq/L (ref 96–112)
GFR: 51.05 mL/min — ABNORMAL LOW (ref >60.00)
Glucose, Bld: 120 mg/dL — ABNORMAL HIGH (ref 70–99)
Potassium: 4.8 mEq/L (ref 3.5–5.1)
Sodium: 138 mEq/L (ref 135–145)
Total Bilirubin: 0.8 mg/dL (ref 0.3–1.2)
Total Protein: 6.6 g/dL (ref 6.0–8.3)

## 2013-08-13 LAB — CBC WITH DIFFERENTIAL/PLATELET
Basophils Absolute: 0 10*3/uL (ref 0.0–0.1)
Eosinophils Relative: 2.1 % (ref 0.0–5.0)
HCT: 44 % (ref 39.0–52.0)
Lymphocytes Relative: 17.6 % (ref 12.0–46.0)
Lymphs Abs: 1.4 10*3/uL (ref 0.7–4.0)
Monocytes Relative: 5.1 % (ref 3.0–12.0)
Neutrophils Relative %: 75 % (ref 43.0–77.0)
Platelets: 114 10*3/uL — ABNORMAL LOW (ref 150.0–400.0)
RDW: 13.9 % (ref 11.5–14.6)
WBC: 8.1 10*3/uL (ref 4.5–10.5)

## 2013-08-13 MED ORDER — HYDROCODONE-ACETAMINOPHEN 10-325 MG PO TABS: 1.0000 | ORAL_TABLET | Freq: Every evening | ORAL | Status: DC | PRN

## 2013-08-13 MED ORDER — SIMVASTATIN 40 MG PO TABS: 40.0000 mg | ORAL_TABLET | Freq: Every morning | ORAL | Status: AC

## 2013-08-13 MED ORDER — PREDNISONE (PAK) 10 MG PO TABS: ORAL_TABLET | ORAL | Status: DC

## 2013-08-13 MED ORDER — FLUTICASONE PROPIONATE HFA 44 MCG/ACT IN AERO: 2.0000 | INHALATION_SPRAY | Freq: Two times a day (BID) | RESPIRATORY_TRACT | Status: DC

## 2013-08-13 MED ORDER — LEVOFLOXACIN 500 MG PO TABS: 500.0000 mg | ORAL_TABLET | Freq: Every day | ORAL | Status: DC

## 2013-08-13 NOTE — Progress Notes (Signed)
Pre visit review using our clinic review tool, if applicable. No additional management support is needed unless otherwise documented below in the visit note. 

## 2013-08-13 NOTE — Progress Notes (Addendum)
Patient ID: Kyle Gutierrez, male   DOB: 1932/07/30, 79 y.o.   MRN: [id number]   Patient Active Problem List   Diagnosis Date Noted  . Cough 08/15/2013  . History of tobacco abuse 08/13/2013  . OSA (obstructive sleep apnea) 06/01/2013  . Pyelonephritis 05/20/2013  . Myoclonic jerking while sleeping 05/20/2013  . Routine general medical examination at a health care facility 04/16/2013  . Long term (current) use of anticoagulants 08/11/2012  . Hematuria, gross 04/11/2012  . Chronic atrial fibrillation   . Coronary artery disease, non-occlusive   . Carotid artery plaque   . Valvular cardiomyopathy   . renal cell carcinoma   . Benign prostatic hypertrophy   . Screening for colon cancer 02/14/2012  . Retinopathy   . Pulmonary nodule/lesion, solitary 08/16/2011  . Substance abuse   . Thrombocytopenia, unspecified   . Neuropathy, alcoholic   . Peripheral vascular disease 08/06/2011  . LUMBAR RADICULOPATHY, LEFT 05/02/2009  . PERIPHERAL NEUROPATHY 01/05/2009  . HYPERTENSION 12/05/2008  . ATRIAL FIBRILLATION 12/05/2008  . SKIN CANCER, HX OF 12/05/2008  . COLONIC POLYPS, HX OF 12/05/2008  . DIVERTICULITIS, HX OF 12/05/2008    Subjective:  CC:   Chief Complaint  Patient presents with  . Follow-up    CPAP followup  . Cough    dry cough x 1 week    HPI:   Kyle Gutierrez a 79 y.o. male who presents with persistent cough.  Dry cough started 10 days ago. No fevers,  Some myalgias,  Sore throat,  Stayed in and took tylenol  And used cough drops.    No wheezing.   Symptoms  improved until last night he developed severe cough and had to remove his CPAP for a few hours but coughed the rest of the night.  No recent travel or sick contacts but he has only cleaned the CPAP Once  In 6 weeks.     Past Medical History  Diagnosis Date  . Hypertension   . Substance abuse     alcohol  . Thrombocytopenia, unspecified   . Neuropathy, alcoholic     per Neurologic eval, remote  .  Retinopathy     followed by [facility]  . Chronic atrial fibrillation   . Coronary artery disease, non-occlusive   . Carotid artery plaque     bilateral  . Valvular cardiomyopathy     severe mitral and tricuspid insufficiency, ECHO July 2012  . Benign prostatic hypertrophy     s/p TURP  . renal cell carcinoma     left kidney cancer    Past Surgical History  Procedure Laterality Date  . Transurethral resection of prostate  2003  . Prostate surgery    . Eye surgery      bilateral cataracts removed  . Partial nephrectomy Left     10'02  . Cystoscopy w/ retrogrades N/A 05/06/2013    Procedure: CYSTOSCOPY WITH RETROGRADE PYELOGRAM,  BALLOON DILATION OF URETHRAL STRICTURE;  Surgeon: Crecencio Mc, MD;  Location: [facility];  Service: Urology;  Laterality: N/A;     The following portions of the patient's history were reviewed and updated as appropriate: Allergies, current medications, and problem list.    Review of Systems:   12 Pt  review of systems was negative except those addressed in the HPI,     History   Social History  . Marital Status: Married    Spouse Name: N/A    Number of Children: N/A  . Years of Education: N/A   Occupational  History  . Not on file.   Social History Main Topics  . Smoking status: Former Smoker    Quit date: 03/29/1977  . Smokeless tobacco: Never Used  . Alcohol Use: Yes     Comment: daily 5-6 ounces wine  . Drug Use: No  . Sexual Activity: Yes   Other Topics Concern  . Not on file   Social History Narrative  . No narrative on file    Objective:  Filed Vitals:   08/13/13 0859  BP: 110/60  Pulse: 90  Temp: 97.1 F (36.2 C)  Resp: 12     General appearance: alert, cooperative and appears stated age Ears: normal TM's and external ear canals both ears Throat: lips, mucosa, and tongue normal; teeth and gums normal Neck: no adenopathy, no carotid bruit, supple, symmetrical, trachea midline and thyroid not enlarged, symmetric, no  tenderness/mass/nodules Back: symmetric, no curvature. ROM normal. No CVA tenderness. Lungs: clear to auscultation bilaterally Heart: regular rate and rhythm, S1, S2 normal, no murmur, click, rub or gallop Abdomen: soft, non-tender; bowel sounds normal; no masses,  no organomegaly Pulses: 2+ and symmetric Skin: Skin color, texture, turgor normal. No rashes or lesions Lymph nodes: Cervical, supraclavicular, and axillary nodes normal.  Assessment and Plan:  Neuropathy, alcoholic He has had mild progression  Per recent Neurology evaluation.   Cough Empiric treatment with levaquin, flovent and a probiotic for tracheobronchitis.  Chest x ray normal   OSA (obstructive sleep apnea) He is wearing his CPAP every night for over 4 hours but has not been cleaning it regularly.  Cleaning advised    Updated Medication List Outpatient Encounter Prescriptions as of 08/13/2013  Medication Sig  . acetaminophen (TYLENOL) 500 MG tablet Take 500 mg by mouth as needed.   Marland Kitchen losartan-hydrochlorothiazide (HYZAAR) 100-12.5 MG per tablet Take 6.25 tablets by mouth daily.   . metoprolol succinate (TOPROL-XL) 25 MG 24 hr tablet Take by mouth every morning. Takes 1/2 tablet  . simvastatin (ZOCOR) 40 MG tablet Take 1 tablet (40 mg total) by mouth every morning.  . warfarin (COUMADIN) 4 MG tablet Take 5 mg by mouth every morning.   . [DISCONTINUED] simvastatin (ZOCOR) 40 MG tablet Take 40 mg by mouth every morning.  . [DISCONTINUED] fluticasone (FLOVENT HFA) 44 MCG/ACT inhaler Inhale 2 puffs into the lungs 2 (two) times daily.  . [DISCONTINUED] HYDROcodone-acetaminophen (NORCO) 10-325 MG per tablet Take 1 tablet by mouth at bedtime as needed. As neded for severe cough  . [DISCONTINUED] HYDROcodone-acetaminophen (NORCO/VICODIN) 5-325 MG per tablet Take 1-2 tablets by mouth every 6 (six) hours as needed for pain.  . [DISCONTINUED] levofloxacin (LEVAQUIN) 500 MG tablet Take 1 tablet (500 mg total) by mouth daily.  .  [DISCONTINUED] phenazopyridine (PYRIDIUM) 100 MG tablet Take 1 tablet (100 mg total) by mouth 3 (three) times daily as needed for pain (for burning).  . [DISCONTINUED] zolpidem (AMBIEN) 5 MG tablet Take 1 tablet (5 mg total) by mouth at bedtime as needed for sleep. DO NOT USE UNTIL THE NIGHT OF YOUR SLEEP STUDY

## 2013-08-13 NOTE — Patient Instructions (Signed)
Please clean your CPAP machine as directed.  If you do not it will be a source of recurrent infection  Please go to [facility] office for your x ray today  Use the inhaler twice daily and rinse your mouth afterward to prevent thrush  I may treat you with an antibiotic depending on your chest x ray   Please take a probiotic ( Align, Floraque or Culturelle) while you are on the antibiotic to prevent a serious antibiotic associated diarrhea  Called clostirudium dificile colitis and a vaginal yeast infection

## 2013-08-15 ENCOUNTER — Encounter: Payer: Self-pay | Admitting: Internal Medicine

## 2013-08-15 DIAGNOSIS — R059 Cough, unspecified: Secondary | ICD-10-CM | POA: Insufficient documentation

## 2013-08-15 DIAGNOSIS — R05 Cough: Secondary | ICD-10-CM | POA: Insufficient documentation

## 2013-08-15 NOTE — Assessment & Plan Note (Signed)
Empiric treatment with levaquin, flovent and a probiotic for tracheobronchitis.  Chest x ray normal

## 2013-08-15 NOTE — Assessment & Plan Note (Signed)
He has had mild progression  Per recent Neurology evaluation.

## 2013-08-16 LAB — LEGIONELLA ANTIGEN, URINE: Result - LGAGUR: NEGATIVE

## 2013-08-17 ENCOUNTER — Encounter: Payer: Self-pay | Admitting: Internal Medicine

## 2013-09-01 ENCOUNTER — Encounter: Payer: Self-pay | Admitting: Podiatry

## 2013-09-06 ENCOUNTER — Encounter: Payer: Self-pay | Admitting: Podiatry

## 2013-09-06 ENCOUNTER — Ambulatory Visit (INDEPENDENT_AMBULATORY_CARE_PROVIDER_SITE_OTHER): Payer: Medicare Other | Admitting: Podiatry

## 2013-09-06 VITALS — BP 123/75 | HR 75 | Resp 16 | Ht 74.0 in | Wt 206.3 lb

## 2013-09-06 DIAGNOSIS — M79609 Pain in unspecified limb: Secondary | ICD-10-CM

## 2013-09-06 DIAGNOSIS — B351 Tinea unguium: Secondary | ICD-10-CM

## 2013-09-06 NOTE — Progress Notes (Signed)
Oliverio presents today for chief complaint of painful toenails one through 5 bilateral. History peripheral vascular disease.  Objective: Vital signs are stable he is alert and oriented x3. Capillary fill time to digits one through 5 of the bilateral foot is noted to be immediate. Nails are thick yellow dystrophic onychomycotic and painful palpation.  Assessment: Pain in limb secondary to onychomycosis 1 through 5 bilateral.  Plan: Debridement of nails 1 through 5 bilateral is cover service secondary to pain.

## 2013-09-19 ENCOUNTER — Encounter: Payer: Self-pay | Admitting: Internal Medicine

## 2013-09-20 ENCOUNTER — Encounter: Payer: Self-pay | Admitting: Internal Medicine

## 2013-09-21 ENCOUNTER — Telehealth: Payer: Self-pay | Admitting: Internal Medicine

## 2013-09-21 NOTE — Telephone Encounter (Signed)
Juliann Pulse, please contact [facility] about the order they supposedly sent over on Kyle Gutierrez CPAP settings. Per his  e-mail they were going to suggest a BiPAP instead of CPAP

## 2013-09-21 NOTE — Telephone Encounter (Signed)
I have tried to call [facility] all morning received call from patient today, I have left message for return call to office. FYI

## 2013-09-21 NOTE — Telephone Encounter (Signed)
Kyle Gutierrez, can you look into this patient's complaints . ?

## 2013-10-21 ENCOUNTER — Ambulatory Visit: Payer: Medicare Other | Admitting: Internal Medicine

## 2013-11-03 ENCOUNTER — Encounter: Payer: Self-pay | Admitting: Internal Medicine

## 2013-11-29 ENCOUNTER — Ambulatory Visit (INDEPENDENT_AMBULATORY_CARE_PROVIDER_SITE_OTHER): Payer: Medicare Other | Admitting: Podiatry

## 2013-11-29 ENCOUNTER — Encounter: Payer: Self-pay | Admitting: Podiatry

## 2013-11-29 VITALS — BP 122/87 | HR 50 | Resp 18

## 2013-11-29 DIAGNOSIS — B351 Tinea unguium: Secondary | ICD-10-CM

## 2013-11-29 DIAGNOSIS — M79609 Pain in unspecified limb: Secondary | ICD-10-CM

## 2013-11-29 NOTE — Progress Notes (Signed)
He presents today chief complaint of painful elongated toenails one through 5 bilateral.  Objective: Vital signs are stable he is alert and oriented x3. His nails are thick yellow dystrophic clinically mycotic and painful palpation.  Assessment: Neuropathy with pain in limb secondary to onychomycosis 1 through 5 bilateral.  Plan: Debridement of nails 1 through 5 bilateral covered service secondary to pain and neuropathy.

## 2013-12-02 ENCOUNTER — Telehealth: Payer: Self-pay | Admitting: Internal Medicine

## 2013-12-08 ENCOUNTER — Ambulatory Visit: Payer: Self-pay | Admitting: Otolaryngology

## 2013-12-14 ENCOUNTER — Encounter: Payer: Self-pay | Admitting: Internal Medicine

## 2013-12-16 ENCOUNTER — Telehealth: Payer: Self-pay | Admitting: Internal Medicine

## 2013-12-16 NOTE — Telephone Encounter (Signed)
Kyle Gutierrez,  See below  You can come by the office and pick up a copy of the compliance report that we recevied from [facility] and scanned into your chart .  Kyle Gutierrez will make a copy for you. Regards,  Dr. Derrel Nip   ===View-only below this line===   ----- Message -----    From: Reather Laurence    Sent: 12/14/2013  4:29 PM EDT      To: Deborra Medina, MD Subject: Non-Urgent Medical Question  Medicare is looking for a representation from you that I am using and benefit from the CPAP machine you issued orders for last Nov. In Dec I brought you a report of my usage for the first 30 days, apparently now, before they will approve supplies, they need the above representation. I am OUT of supplies and it is critical that you respond as quickly as possible. Thank you

## 2013-12-16 NOTE — Telephone Encounter (Signed)
Called patient left message for patient to call office. Paper work ready for pick up and placed up front.

## 2013-12-17 ENCOUNTER — Telehealth: Payer: Self-pay | Admitting: Internal Medicine

## 2013-12-17 ENCOUNTER — Encounter: Payer: Self-pay | Admitting: Internal Medicine

## 2013-12-17 NOTE — Assessment & Plan Note (Signed)
He is wearing his CPAP every night for over 4 hours but has not been cleaning it regularly.  Cleaning advised

## 2013-12-28 ENCOUNTER — Ambulatory Visit: Payer: Self-pay | Admitting: Otolaryngology

## 2014-01-31 ENCOUNTER — Ambulatory Visit: Payer: Self-pay | Admitting: Orthopedic Surgery

## 2014-02-04 DIAGNOSIS — E7800 Pure hypercholesterolemia, unspecified: Secondary | ICD-10-CM | POA: Insufficient documentation

## 2014-02-12 ENCOUNTER — Emergency Department: Payer: Self-pay | Admitting: Internal Medicine

## 2014-02-27 ENCOUNTER — Encounter: Payer: Self-pay | Admitting: Internal Medicine

## 2014-02-28 ENCOUNTER — Ambulatory Visit: Payer: Self-pay | Admitting: Orthopedic Surgery

## 2014-02-28 ENCOUNTER — Ambulatory Visit (INDEPENDENT_AMBULATORY_CARE_PROVIDER_SITE_OTHER): Payer: Medicare Other | Admitting: Podiatry

## 2014-02-28 VITALS — BP 131/75 | HR 71 | Resp 16

## 2014-02-28 DIAGNOSIS — B351 Tinea unguium: Secondary | ICD-10-CM

## 2014-02-28 DIAGNOSIS — M79676 Pain in unspecified toe(s): Secondary | ICD-10-CM

## 2014-02-28 DIAGNOSIS — M79609 Pain in unspecified limb: Secondary | ICD-10-CM

## 2014-02-28 LAB — BASIC METABOLIC PANEL
Anion Gap: 9 (ref 7–16)
BUN: 32 mg/dL — AB (ref 7–18)
CALCIUM: 9.2 mg/dL (ref 8.5–10.1)
Chloride: 103 mmol/L (ref 98–107)
Co2: 29 mmol/L (ref 21–32)
Creatinine: 1.19 mg/dL (ref 0.60–1.30)
EGFR (African American): >60
EGFR (Non-African Amer.): 57 — ABNORMAL LOW
Glucose: 87 mg/dL (ref 65–99)
OSMOLALITY: 288 (ref 275–301)
POTASSIUM: 3.7 mmol/L (ref 3.5–5.1)
Sodium: 141 mmol/L (ref 136–145)

## 2014-02-28 LAB — CBC
HCT: 44.4 % (ref 40.0–52.0)
HGB: 14.4 g/dL (ref 13.0–18.0)
MCH: 30.2 pg (ref 26.0–34.0)
MCHC: 32.5 g/dL (ref 32.0–36.0)
MCV: 93 fL (ref 80–100)
Platelet: 94 10*3/uL — ABNORMAL LOW (ref 150–440)
RBC: 4.78 10*6/uL (ref 4.40–5.90)
RDW: 14 % (ref 11.5–14.5)
WBC: 7.3 10*3/uL (ref 3.8–10.6)

## 2014-02-28 LAB — URINALYSIS, COMPLETE
BACTERIA: NONE SEEN
BILIRUBIN, UR: NEGATIVE
Glucose,UR: NEGATIVE mg/dL (ref 0–75)
Ketone: NEGATIVE
NITRITE: NEGATIVE
PH: 5 (ref 4.5–8.0)
Protein: NEGATIVE
Specific Gravity: 1.017 (ref 1.003–1.030)
Squamous Epithelial: <1

## 2014-02-28 LAB — PROTIME-INR
INR: 2.1
Prothrombin Time: 23.4 secs — ABNORMAL HIGH (ref 11.5–14.7)

## 2014-02-28 LAB — APTT: Activated PTT: 39.6 secs — ABNORMAL HIGH (ref 23.6–35.9)

## 2014-02-28 NOTE — Progress Notes (Signed)
He presents today with a chief complaint of painful elongated toenails.  Objective: Pulses are palpable bilateral. Nails are thick yellow dystrophic onychomycotic and painful palpation.  Assessment: Pain in limb secondary to onychomycosis 1 through 5 bilateral.  Plan: Debridement of nails 1 through 5 bilateral covered service secondary to pain.

## 2014-03-16 ENCOUNTER — Ambulatory Visit: Payer: Self-pay | Admitting: Orthopedic Surgery

## 2014-03-16 LAB — PROTIME-INR
INR: 1.1
PROTHROMBIN TIME: 13.7 s (ref 11.5–14.7)

## 2014-03-16 LAB — APTT: ACTIVATED PTT: 29.2 s (ref 23.6–35.9)

## 2014-03-17 LAB — BASIC METABOLIC PANEL
Anion Gap: 6 — ABNORMAL LOW (ref 7–16)
BUN: 20 mg/dL — ABNORMAL HIGH (ref 7–18)
CHLORIDE: 104 mmol/L (ref 98–107)
Calcium, Total: 8.8 mg/dL (ref 8.5–10.1)
Co2: 26 mmol/L (ref 21–32)
Creatinine: 1.23 mg/dL (ref 0.60–1.30)
GFR CALC NON AF AMER: 55 — AB
Glucose: 139 mg/dL — ABNORMAL HIGH (ref 65–99)
Osmolality: 277 (ref 275–301)
Potassium: 4.2 mmol/L (ref 3.5–5.1)
Sodium: 136 mmol/L (ref 136–145)

## 2014-03-17 LAB — CBC WITH DIFFERENTIAL/PLATELET
Basophil #: 0 10*3/uL (ref 0.0–0.1)
Basophil %: 0.1 %
EOS PCT: 0.1 %
Eosinophil #: 0 10*3/uL (ref 0.0–0.7)
HCT: 39.4 % — AB (ref 40.0–52.0)
HGB: 13.2 g/dL (ref 13.0–18.0)
LYMPHS ABS: 0.6 10*3/uL — AB (ref 1.0–3.6)
Lymphocyte %: 5.9 %
MCH: 31.3 pg (ref 26.0–34.0)
MCHC: 33.4 g/dL (ref 32.0–36.0)
MCV: 94 fL (ref 80–100)
MONOS PCT: 4.8 %
Monocyte #: 0.5 x10 3/mm (ref 0.2–1.0)
NEUTROS PCT: 89.1 %
Neutrophil #: 8.5 10*3/uL — ABNORMAL HIGH (ref 1.4–6.5)
PLATELETS: 123 10*3/uL — AB (ref 150–440)
RBC: 4.21 10*6/uL — ABNORMAL LOW (ref 4.40–5.90)
RDW: 14.3 % (ref 11.5–14.5)
WBC: 9.6 10*3/uL (ref 3.8–10.6)

## 2014-03-17 LAB — APTT: Activated PTT: 30 secs (ref 23.6–35.9)

## 2014-03-17 LAB — PROTIME-INR
INR: 1.1
PROTHROMBIN TIME: 14.5 s (ref 11.5–14.7)

## 2014-03-18 LAB — PROTIME-INR
INR: 1.2
PROTHROMBIN TIME: 15.1 s — AB (ref 11.5–14.7)

## 2014-03-19 ENCOUNTER — Emergency Department: Payer: Self-pay | Admitting: Emergency Medicine

## 2014-03-19 LAB — COMPREHENSIVE METABOLIC PANEL
ALK PHOS: 67 U/L
ALT: 22 U/L (ref 12–78)
ANION GAP: 2 — AB (ref 7–16)
AST: 35 U/L (ref 15–37)
Albumin: 3.1 g/dL — ABNORMAL LOW (ref 3.4–5.0)
BUN: 19 mg/dL — AB (ref 7–18)
Bilirubin,Total: 1 mg/dL (ref 0.2–1.0)
CO2: 34 mmol/L — AB (ref 21–32)
CREATININE: 1.34 mg/dL — AB (ref 0.60–1.30)
Calcium, Total: 9.2 mg/dL (ref 8.5–10.1)
Chloride: 103 mmol/L (ref 98–107)
GFR CALC AF AMER: 58 — AB
GFR CALC NON AF AMER: 50 — AB
GLUCOSE: 93 mg/dL (ref 65–99)
Osmolality: 279 (ref 275–301)
POTASSIUM: 4 mmol/L (ref 3.5–5.1)
Sodium: 139 mmol/L (ref 136–145)
TOTAL PROTEIN: 6.8 g/dL (ref 6.4–8.2)

## 2014-03-19 LAB — CBC
HCT: 40.5 % (ref 40.0–52.0)
HGB: 13.5 g/dL (ref 13.0–18.0)
MCH: 31.1 pg (ref 26.0–34.0)
MCHC: 33.4 g/dL (ref 32.0–36.0)
MCV: 93 fL (ref 80–100)
PLATELETS: 122 10*3/uL — AB (ref 150–440)
RBC: 4.35 10*6/uL — ABNORMAL LOW (ref 4.40–5.90)
RDW: 14.2 % (ref 11.5–14.5)
WBC: 7 10*3/uL (ref 3.8–10.6)

## 2014-03-19 LAB — PROTIME-INR
INR: 1.1
Prothrombin Time: 13.9 secs (ref 11.5–14.7)

## 2014-03-19 LAB — URINALYSIS, COMPLETE
Bacteria: NONE SEEN
Bilirubin,UR: NEGATIVE
Blood: NEGATIVE
GLUCOSE, UR: NEGATIVE mg/dL (ref 0–75)
Leukocyte Esterase: NEGATIVE
NITRITE: NEGATIVE
PROTEIN: NEGATIVE
Ph: 7 (ref 4.5–8.0)
SPECIFIC GRAVITY: 1.009 (ref 1.003–1.030)
Squamous Epithelial: <1
WBC UR: 1 /HPF (ref 0–5)

## 2014-03-19 LAB — TROPONIN I

## 2014-03-20 LAB — URINE CULTURE

## 2014-03-21 IMAGING — RF DG RETROGRADE PYELOGRAM
1 series · 7 of 7 positions shown · non-contrast
Comparison: CT abdomen pelvis - 04/27/2013.

Fluoroscopy time:  47 seconds.

CLINICAL DATA: Cystogram with retrograde pyelogram (urethral
stricture)

RETROGRADE PYELOGRAM

[Series 1: run · 7 of 7 slices shown]
[im 1/7]
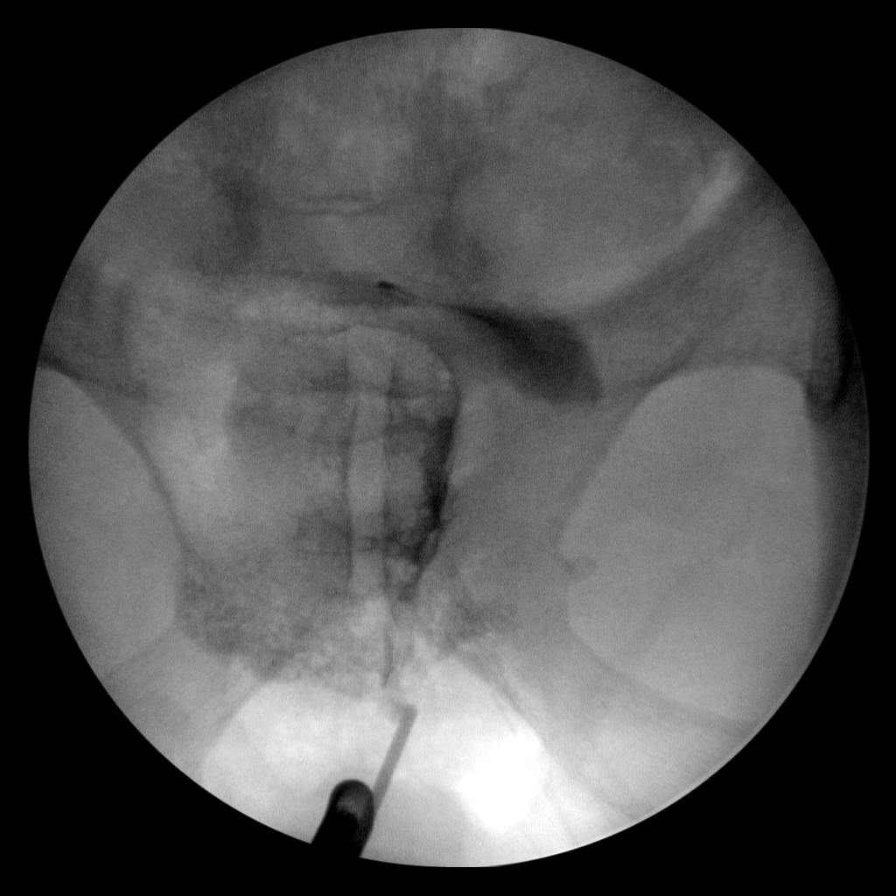
[im 2/7]
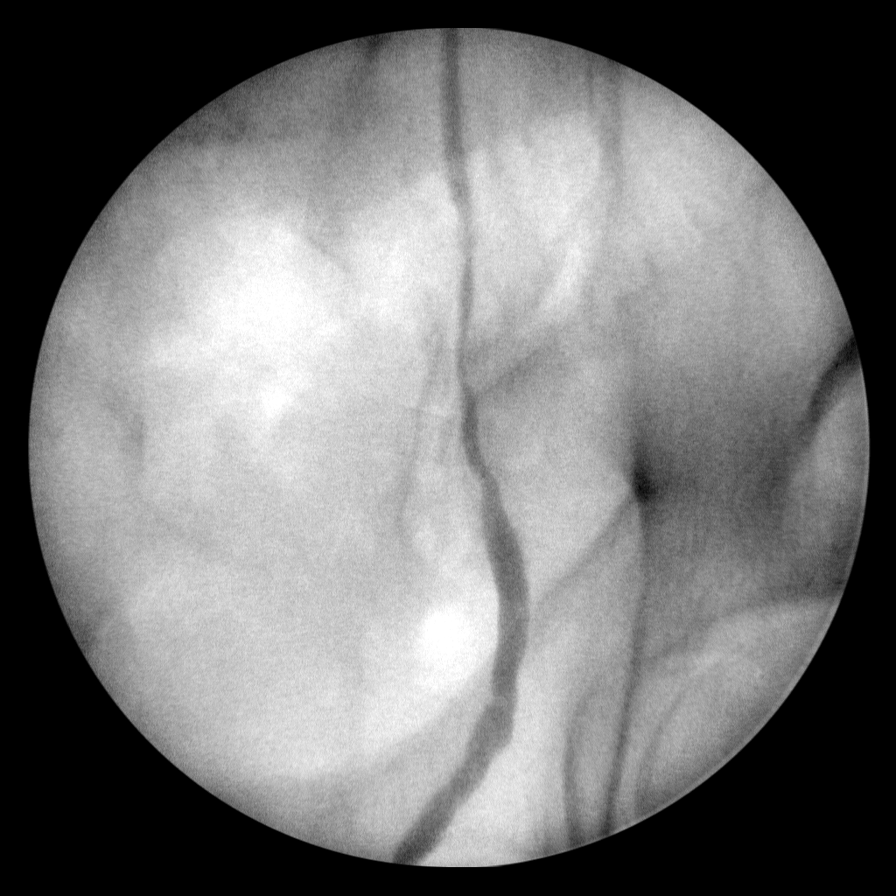
[im 3/7]
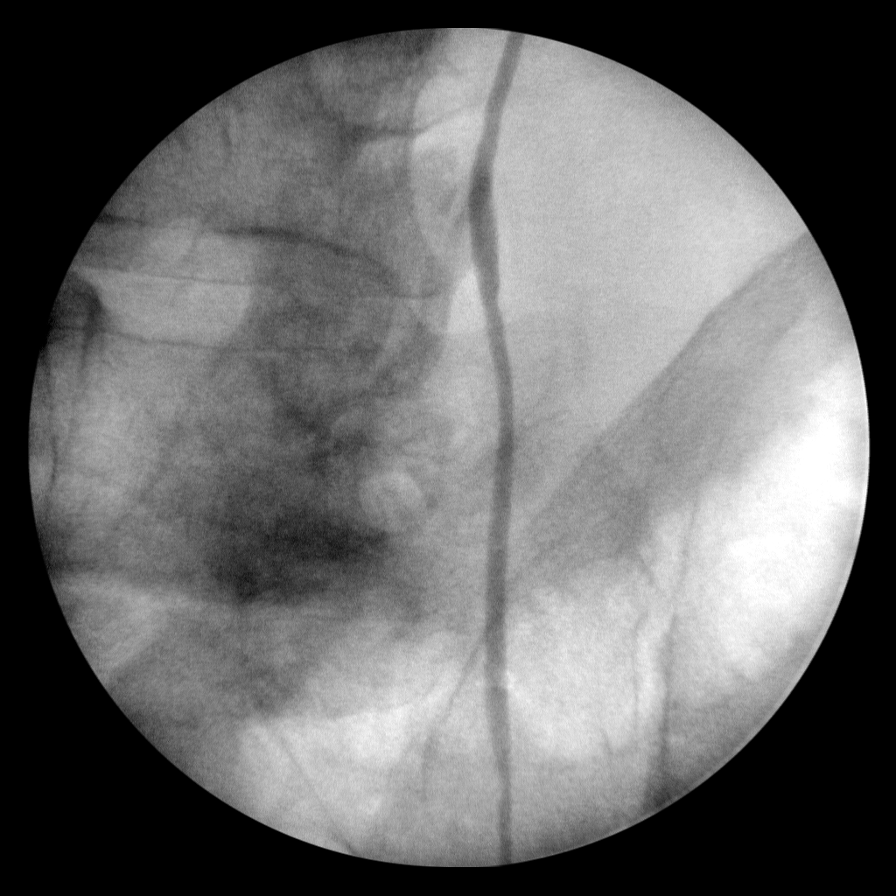
[im 4/7]
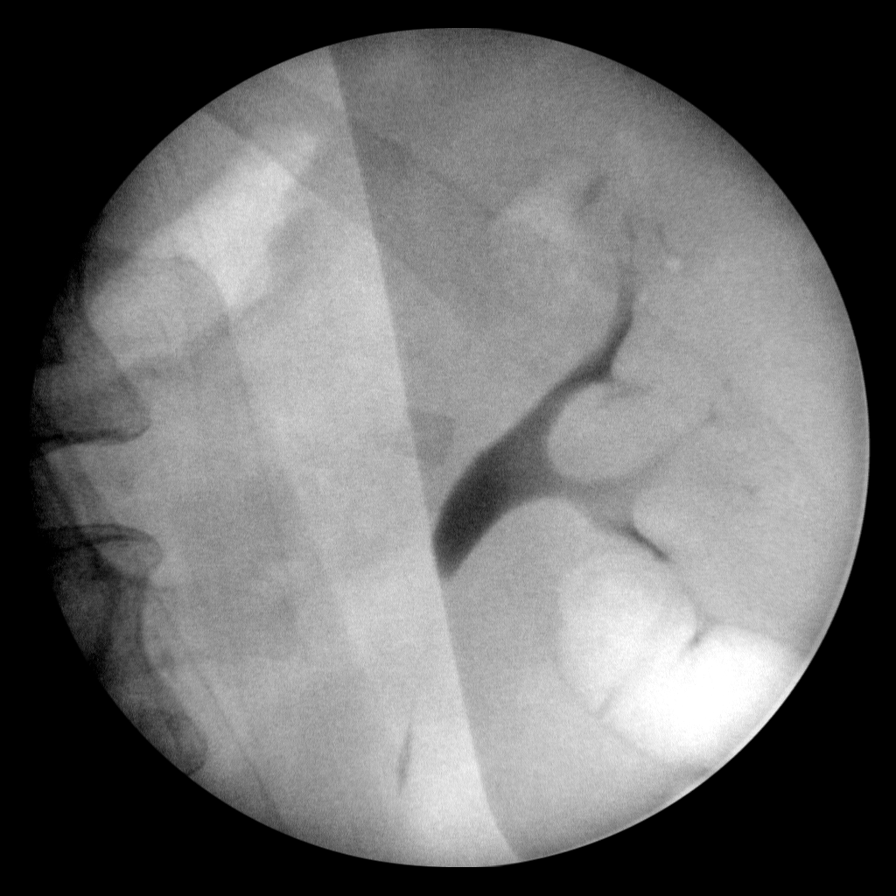
[im 5/7]
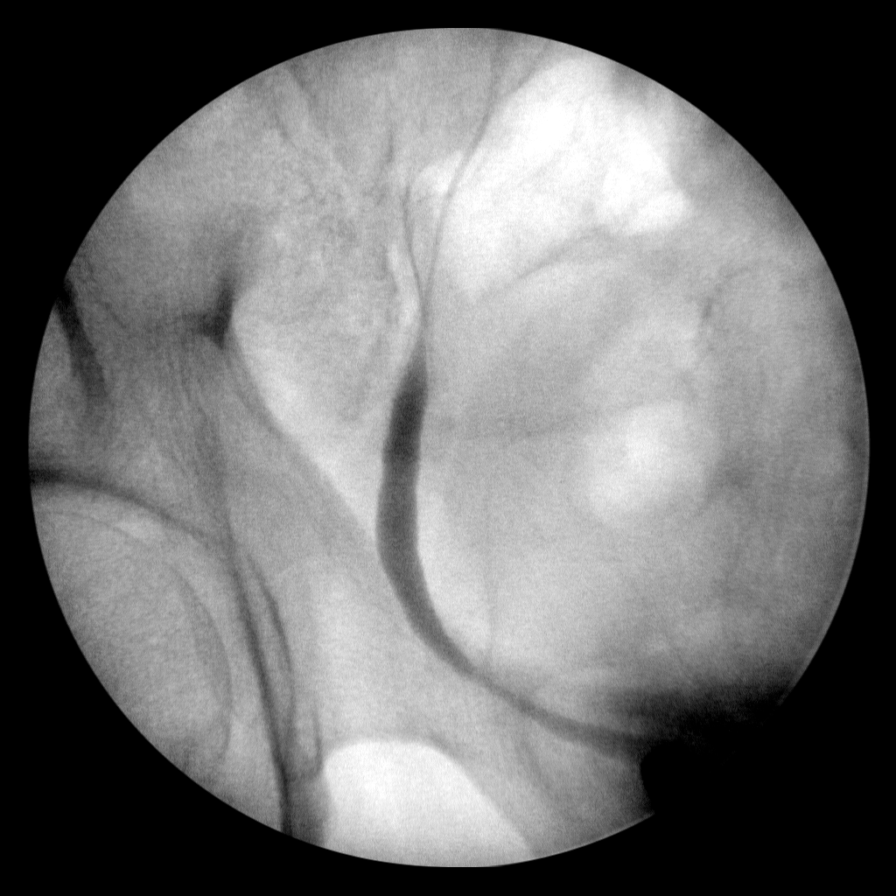
[im 6/7]
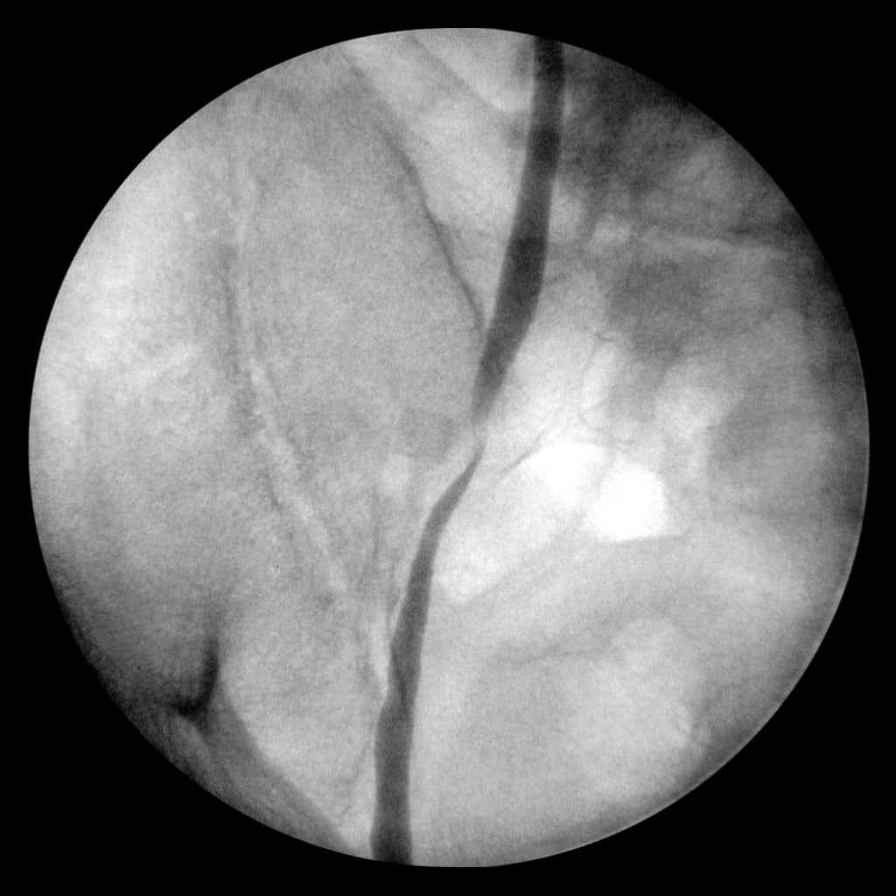
[im 7/7]
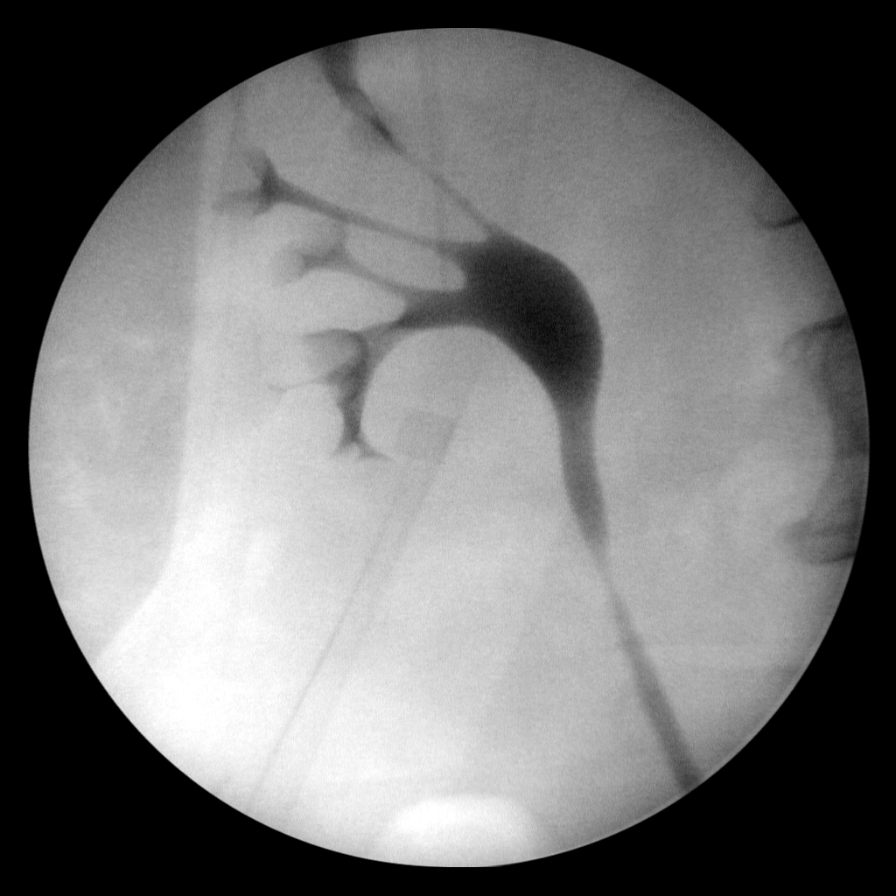

[7 of 7 positions shown; findings below may reference images not displayed]

FINDINGS: Seven spot intraoperative fluoroscopic images during bilateral
retrograde pyelogram provided for review.

There is suboptimal opacification and distension of the urinary
bladder.

Subsequent images demonstrate selective cannulation and contrast
injection from the distal aspect of the left ureter.  Several tiny
apparent filling defects within the distal aspect of the left
ureter likely represent air bubbles and are suboptimally evaluated
on the provided fluoroscopic image.  No discrete filling defect is
seen within the opacified portions of the superior aspect of the
left ureter or the left renal collecting system.  No evidence of
left-sided hydronephrosis.

Subsequent images demonstrate selective cannulation and contrast
injection from the distal aspect of the right ureter.  No discrete
filling defects are seen along the opacified course of the right
ureter or right renal collecting system.  No evidence of right-
sided urinary obstruction.
IMPRESSION: Bilateral retrograde pyelogram as above.

## 2014-03-26 IMAGING — CR DG CHEST 2V
1 series · 3 of 3 positions shown · non-contrast
Comparison: none

REASON FOR EXAM: fever, hypoxia
COMMENTS:   May transport without cardiac monitor

PROCEDURE:     DXR - DXR CHEST PA (OR AP) AND LATERAL  - May 11, 2013  [DATE]
RESULT:

[Series 1: pa · 0.17mm/px · 3 of 3 slices shown]
[im 1/3]
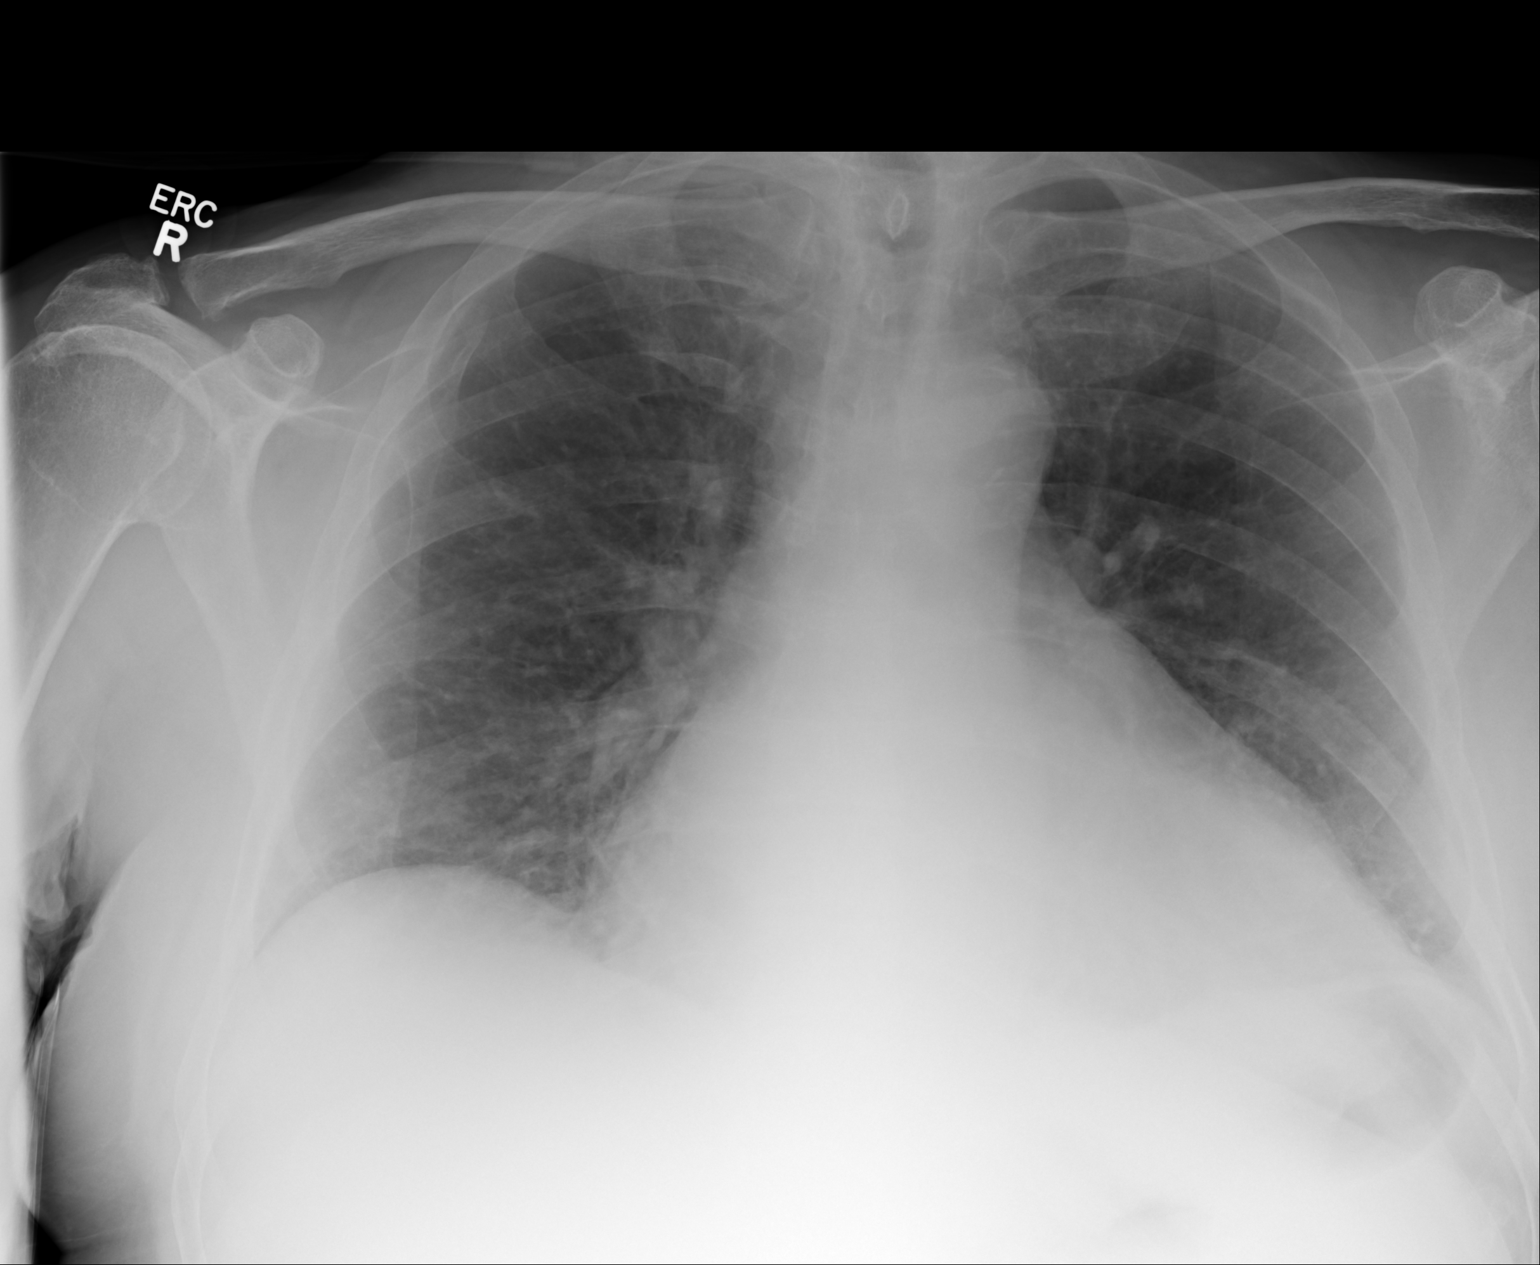
[im 2/3]
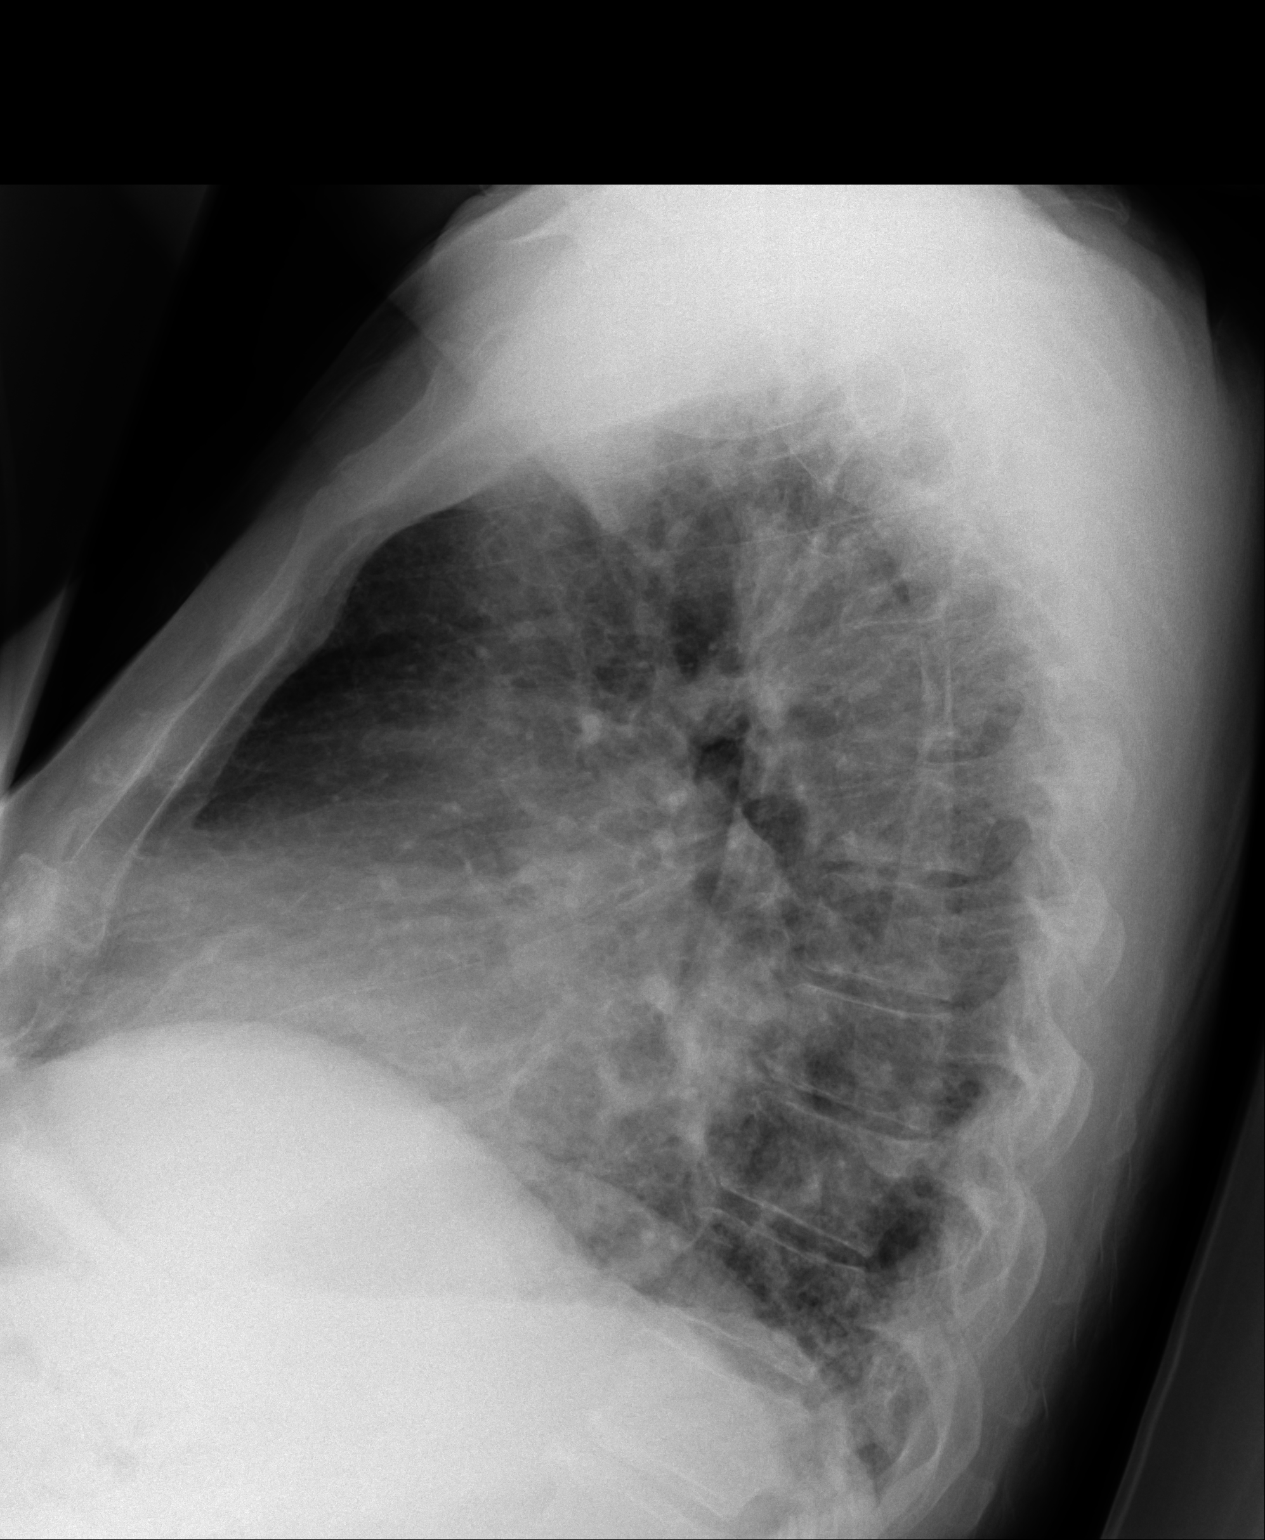
[im 3/3]
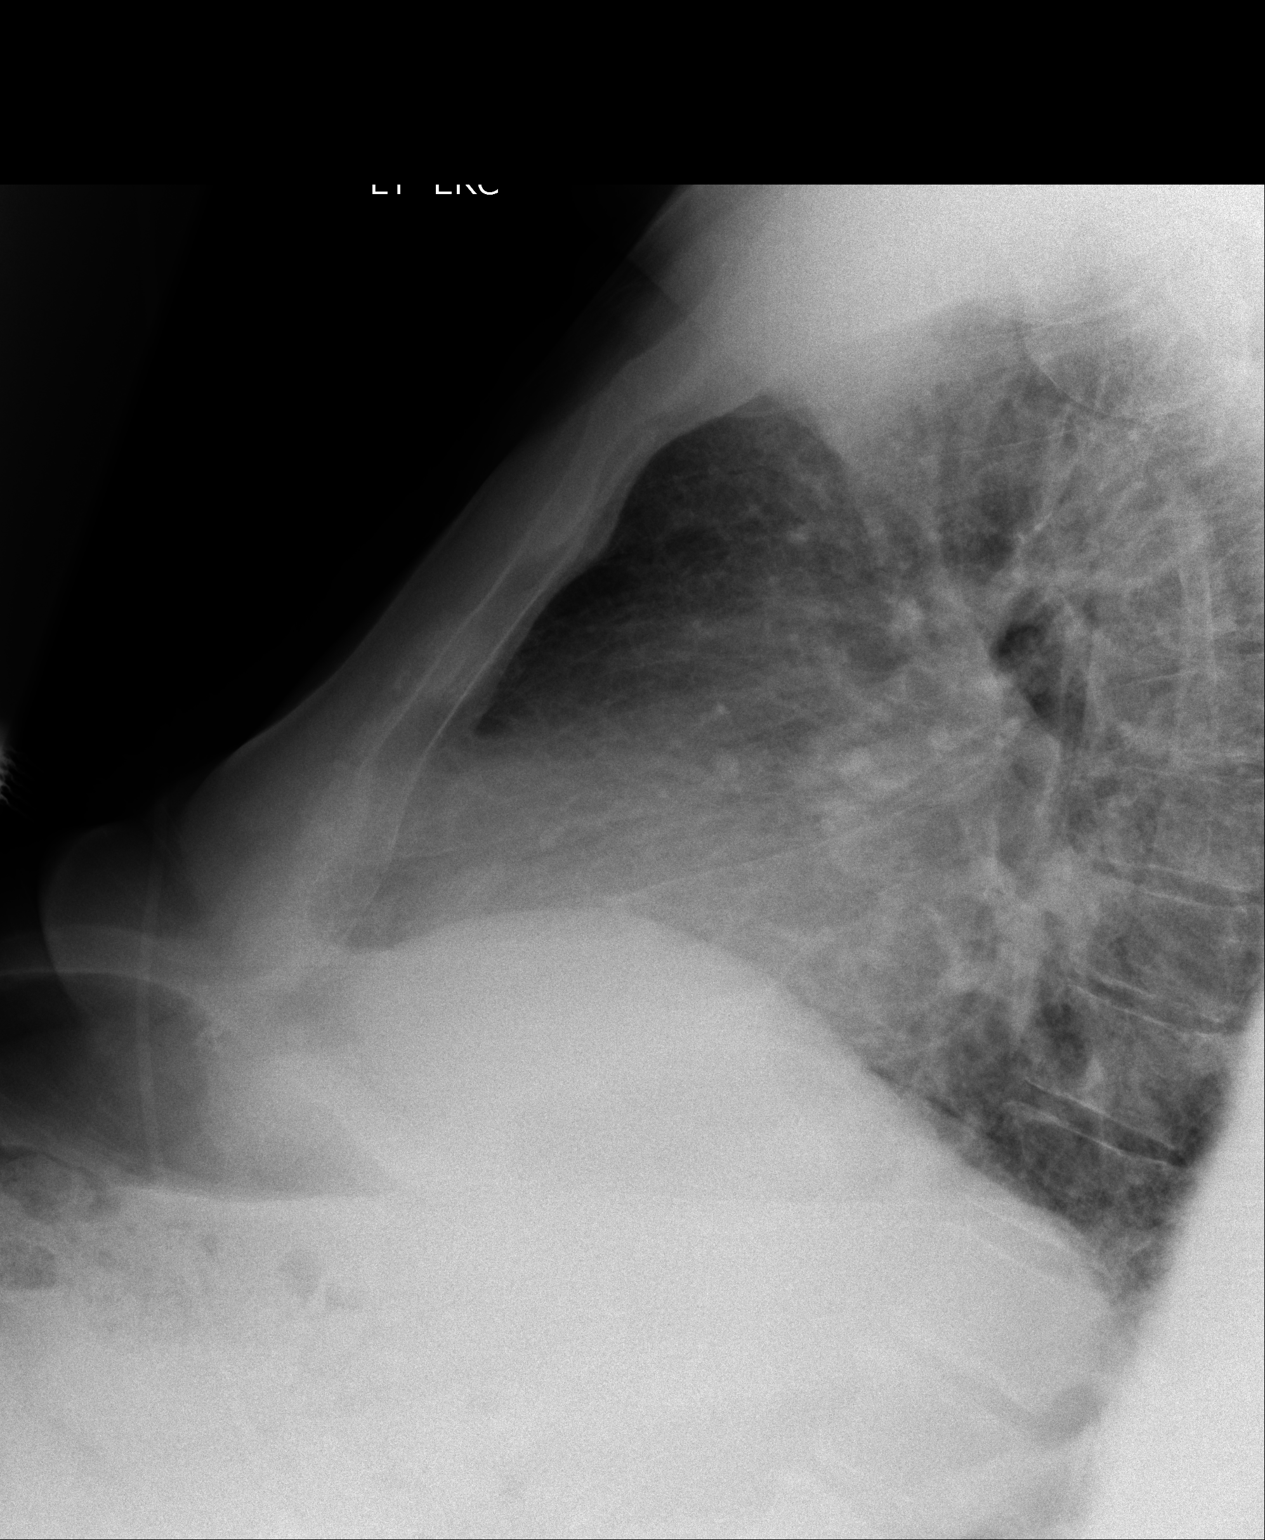

[3 of 3 positions shown; findings below may reference images not displayed]

FINDINGS: The patient has taken a shallow inspiration. With technique taken
into consideration, there is no evidence of focal infiltrates or focal
regions of consolidation. The cardiac silhouette is enlarged indicative of
cardiomegaly. The visualized bony skeleton is unremarkable.
IMPRESSION: 1.  Shallow inspiration.
2.  Cardiomegaly.
3.  No evidence of acute cardiopulmonary disease.

## 2014-05-02 ENCOUNTER — Ambulatory Visit (INDEPENDENT_AMBULATORY_CARE_PROVIDER_SITE_OTHER): Payer: Medicare Other | Admitting: Internal Medicine

## 2014-05-02 ENCOUNTER — Encounter: Payer: Self-pay | Admitting: Internal Medicine

## 2014-05-02 VITALS — BP 130/78 | HR 60 | Temp 97.0°F | Resp 16 | Ht 73.25 in | Wt 217.8 lb

## 2014-05-02 DIAGNOSIS — D696 Thrombocytopenia, unspecified: Secondary | ICD-10-CM

## 2014-05-02 DIAGNOSIS — M79605 Pain in left leg: Secondary | ICD-10-CM

## 2014-05-02 DIAGNOSIS — M79609 Pain in unspecified limb: Secondary | ICD-10-CM

## 2014-05-02 DIAGNOSIS — Z9181 History of falling: Secondary | ICD-10-CM

## 2014-05-02 DIAGNOSIS — Z1159 Encounter for screening for other viral diseases: Secondary | ICD-10-CM

## 2014-05-02 DIAGNOSIS — E785 Hyperlipidemia, unspecified: Secondary | ICD-10-CM

## 2014-05-02 DIAGNOSIS — Z1211 Encounter for screening for malignant neoplasm of colon: Secondary | ICD-10-CM

## 2014-05-02 DIAGNOSIS — Z23 Encounter for immunization: Secondary | ICD-10-CM

## 2014-05-02 DIAGNOSIS — I10 Essential (primary) hypertension: Secondary | ICD-10-CM

## 2014-05-02 DIAGNOSIS — R42 Dizziness and giddiness: Secondary | ICD-10-CM | POA: Insufficient documentation

## 2014-05-02 DIAGNOSIS — G4733 Obstructive sleep apnea (adult) (pediatric): Secondary | ICD-10-CM

## 2014-05-02 DIAGNOSIS — N400 Enlarged prostate without lower urinary tract symptoms: Secondary | ICD-10-CM

## 2014-05-02 DIAGNOSIS — Z7901 Long term (current) use of anticoagulants: Secondary | ICD-10-CM

## 2014-05-02 DIAGNOSIS — Z9889 Other specified postprocedural states: Secondary | ICD-10-CM

## 2014-05-02 DIAGNOSIS — Z0000 Encounter for general adult medical examination without abnormal findings: Secondary | ICD-10-CM

## 2014-05-02 DIAGNOSIS — N12 Tubulo-interstitial nephritis, not specified as acute or chronic: Secondary | ICD-10-CM

## 2014-05-02 LAB — CBC WITH DIFFERENTIAL/PLATELET
Basophils Absolute: 0 10*3/uL (ref 0.0–0.1)
Basophils Relative: 0.3 % (ref 0.0–3.0)
EOS ABS: 0.2 10*3/uL (ref 0.0–0.7)
EOS PCT: 3.3 % (ref 0.0–5.0)
HCT: 44 % (ref 39.0–52.0)
Hemoglobin: 14.5 g/dL (ref 13.0–17.0)
Lymphocytes Relative: 21.9 % (ref 12.0–46.0)
Lymphs Abs: 1.3 10*3/uL (ref 0.7–4.0)
MCHC: 33 g/dL (ref 30.0–36.0)
MCV: 92.8 fl (ref 78.0–100.0)
MONO ABS: 0.4 10*3/uL (ref 0.1–1.0)
Monocytes Relative: 6.6 % (ref 3.0–12.0)
NEUTROS PCT: 67.9 % (ref 43.0–77.0)
Neutro Abs: 4.1 10*3/uL (ref 1.4–7.7)
PLATELETS: 113 10*3/uL — AB (ref 150.0–400.0)
RBC: 4.75 Mil/uL (ref 4.22–5.81)
RDW: 14.4 % (ref 11.5–15.5)
WBC: 6.1 10*3/uL (ref 4.0–10.5)

## 2014-05-02 LAB — COMPREHENSIVE METABOLIC PANEL
ALK PHOS: 62 U/L (ref 39–117)
ALT: 21 U/L (ref 0–53)
AST: 18 U/L (ref 0–37)
Albumin: 4.1 g/dL (ref 3.5–5.2)
BILIRUBIN TOTAL: 1.2 mg/dL (ref 0.2–1.2)
BUN: 26 mg/dL — ABNORMAL HIGH (ref 6–23)
CO2: 30 mEq/L (ref 19–32)
Calcium: 9.6 mg/dL (ref 8.4–10.5)
Chloride: 105 mEq/L (ref 96–112)
Creatinine, Ser: 1.2 mg/dL (ref 0.4–1.5)
GFR: 64.36 mL/min (ref >60.00)
Glucose, Bld: 94 mg/dL (ref 70–99)
Potassium: 4.5 mEq/L (ref 3.5–5.1)
Sodium: 141 mEq/L (ref 135–145)
Total Protein: 6.7 g/dL (ref 6.0–8.3)

## 2014-05-02 LAB — LIPID PANEL
Cholesterol: 134 mg/dL (ref 0–200)
HDL: 50.2 mg/dL (ref >39.00)
LDL Cholesterol: 68 mg/dL (ref 0–99)
NonHDL: 83.8
Total CHOL/HDL Ratio: 3
Triglycerides: 78 mg/dL (ref 0.0–149.0)
VLDL: 15.6 mg/dL (ref 0.0–40.0)

## 2014-05-02 LAB — TSH: TSH: 2.08 u[IU]/mL (ref 0.35–4.50)

## 2014-05-02 LAB — VITAMIN B12: Vitamin B-12: 238 pg/mL (ref 211–911)

## 2014-05-02 NOTE — Patient Instructions (Signed)
You had your annual Medicare wellness exam today   Please use the stool kit to send [place] back a sample to test for blood.  This is your colon CA screening test.   You received the pneumonia vaccine today.  We will contact you with the bloodwork results

## 2014-05-02 NOTE — Progress Notes (Signed)
Pre-visit discussion using our clinic review tool. No additional management support is needed unless otherwise documented below in the visit note.  

## 2014-05-02 NOTE — Assessment & Plan Note (Signed)
With recent episode or urinary retention post operative.  Sees Dr Elnoria Howard for follow up

## 2014-05-02 NOTE — Assessment & Plan Note (Signed)
Occurred getting out of shower,  In May,  Resulting in rotator cduff srugery

## 2014-05-02 NOTE — Assessment & Plan Note (Signed)
Diagnosed by sleep study. She is wearing her CPAP every night a minimum of 6 hours per night and notes improved daytime wakefulness and decreased fatigue  

## 2014-05-02 NOTE — Progress Notes (Addendum)
Patient ID: Kyle Gutierrez, male   DOB: 06-04-1932, 80 y.o.   MRN: [id number]  The patient is here for annual Medicare wellness examination and management of other chronic and acute problems.  Had a fall in May getting out of a walk in shower. Landed on right shoulder,  Tore his rotator cuff, requiring orthopedic surgery 7 weeks ago by [employer] . Spent 2 days in hospital, complicated by development  Of urinary retention from the narcotics  became delirious  and dizzy. Took oxycodone for a few weeks then  Changed it to tramadol ,  And currently using it once daily to help him sleep.   His shoulder is not completely healed yet .  Shoulder was supported in a sling for the first 5 weeks. Currently in week 6 of PT   2) Spontaneous bleeding occurred last night thinks it came from left upper molar. Occurred while drinking white wine yesterday, then again while brushing,  Tooth is sensitive   3) weight gain.  Some has been due to  inactivity,  Some due to edema.    4) LE edema,  Chronic no prior workup  But left has been more swollen since surgery   5) dizziness,  If he closes his eyes, he develops vertigo,  History of neuropathy.    6) alcohol use quantified as one or two drinks daily, mostly wine.   The risk factors are reflected in the social history.  The roster of all physicians providing medical care to patient - is listed in the Snapshot section of the chart.  Activities of daily living:  The patient is 100% independent in all ADLs: dressing, toileting, feeding as well as independent mobility  Home safety : The patient has smoke detectors in the home. They wear seatbelts.  There are no firearms at home. There is no violence in the home.   There is no risks for hepatitis, STDs or HIV. There is no   history of blood transfusion. They have no travel history to infectious disease endemic areas of the world.  The patient has seen their dentist in the last six month. They have seen their eye  doctor in the last year. They admit to slight hearing difficulty with regard to whispered voices and some television programs.  They have deferred audiologic testing in the last year.  They do not  have excessive sun exposure. Discussed the need for sun protection: hats, long sleeves and use of sunscreen if there is significant sun exposure.   Diet: the importance of a healthy diet is discussed. They do have a healthy diet.  The benefits of regular aerobic exercise were discussed. She walks 4 times per week ,  20 minutes.   Depression screen: there are no signs or vegative symptoms of depression- irritability, change in appetite, anhedonia, sadness/tearfullness.  Cognitive assessment: the patient manages all their financial and personal affairs and is actively engaged. They could relate day,date,year and events; recalled 2/3 objects at 3 minutes; performed clock-face test normally.  The following portions of the patient's history were reviewed and updated as appropriate: allergies, current medications, past family history, past medical history,  past surgical history, past social history  and problem list.  Visual acuity was not assessed per patient preference since she has regular follow up with her ophthalmologist. Hearing and body mass index were assessed and reviewed.   During the course of the visit the patient was educated and counseled about appropriate screening and preventive services including : fall prevention ,  diabetes screening, nutrition counseling, colorectal cancer screening, and recommended immunizations.    Objective:  BP 130/78  Pulse 60  Temp(Src) 97 F (36.1 C) (Oral)  Resp 16  Ht 6' 1.25" (1.861 m)  Wt 217 lb 12 oz (98.771 kg)  BMI 28.52 kg/m2  SpO2 96%  BP 130/78  Pulse 60  Temp(Src) 97 F (36.1 C) (Oral)  Resp 16  Ht 6' 1.25" (1.861 m)  Wt 217 lb 12 oz (98.771 kg)  BMI 28.52 kg/m2  SpO2 96%  General Appearance:    Alert, cooperative, no distress, appears  stated age  Head:    Normocephalic, without obvious abnormality, atraumatic  Eyes:    PERRL, conjunctiva/corneas clear, EOM's intact, fundi    benign, both eyes       Ears:    Normal TM's and external ear canals, both ears  Nose:   Nares normal, septum midline, mucosa normal, no drainage   or sinus tenderness  Throat:   Lips, mucosa, and tongue normal; teeth and gums normal  Neck:   Supple, symmetrical, trachea midline, no adenopathy;       thyroid:  No enlargement/tenderness/nodules; no carotid   bruit or JVD  Back:     Symmetric, no curvature, ROM normal, no CVA tenderness  Lungs:     Clear to auscultation bilaterally, respirations unlabored  Chest wall:    No tenderness or deformity  Heart:    Regular rate and rhythm, S1 and S2 normal, no murmur, rub   or gallop  Abdomen:     Soft, non-tender, bowel sounds active all four quadrants,    no masses, no organomegaly  Genitalia:    Normal male without lesion, discharge or tenderness  Rectal:    Normal tone, normal prostate, no masses or tenderness;   guaiac negative stool  Extremities:   Extremities normal, atraumatic, no cyanosis or edema  Pulses:   2+ and symmetric all extremities  Skin:   Skin color, texture, turgor normal, no rashes or lesions  Lymph nodes:   Cervical, supraclavicular, and axillary nodes normal  Neurologic:   CNII-XII intact. Normal strength, sensation and reflexes      throughout   Assessment and Plan: Benign prostatic hypertrophy With recent episode or urinary retention post operative.  Sees Dr Elnoria Howard for follow up   OSA (obstructive sleep apnea) Diagnosed by sleep study. She is wearing her CPAP every night a minimum of 6 hours per night and notes improved daytime wakefulness and decreased fatigue   History of bad fall Occurred getting out of shower,  In May,  Resulting in rotator cduff srugery   S/P rotator cuff surgery Secondary to traumatic rupture. Slow to heal,  continue PT  Dizziness and  giddiness With vertigo suggesting  loss of proprioception.  B12 is low today and will be replaced.   Lab Results  Component Value Date   FOLATE 13.5 01/05/2010   Lab Results  Component Value Date   VITAMINB12 238 05/02/2014     Long term (current) use of anticoagulants With recent episodes of spontaneous bleeding. INRis therapeutic, and thrombocytopenia is stable and not < 100K  . Lab Results  Component Value Date   INR 2.6* 05/19/2013   INR 1.5* 05/14/2013   INR 1.11 05/06/2013   Lab Results  Component Value Date   WBC 6.1 05/02/2014   HGB 14.5 05/02/2014   HCT 44.0 05/02/2014   MCV 92.8 05/02/2014   PLT 113.0* 05/02/2014     Thrombocytopenia, unspecified  thrombocytopenia  is stable and not < 100K  Lab Results  Component Value Date   PLT 113.0* 05/02/2014     HYPERTENSION Well controlled on current regimen. Renal function stable, no changes today.  Lab Results  Component Value Date   CREATININE 1.2 05/02/2014   Lab Results  Component Value Date   NA 141 05/02/2014   K 4.5 05/02/2014   CL 105 05/02/2014   CO2 30 05/02/2014     Routine general medical examination at a health care facility Annual Medicare wellness  exam was done as well as a comprehensive physical exam and management of acute and chronic conditions .  During the course of the visit the patient was educated and counseled about appropriate screening and preventive services including : fall prevention , diabetes screening, nutrition counseling, colorectal cancer screening, and recommended immunizations.  Printed recommendations for health maintenance screenings was given.   Pain in limb Mild accompanied by persistent swelling in left lower leg.  He has PAD and signs of venous insufficiency with no prior evaluation of circulation , and had orthopedic surgery 7 weeks ago on his shoulder.  Referral to [facility] for comprehensive evaluation of ciruclation in leg leg to rule out DVT an, VI and PAD.  He has been taking  coumadin chronically for atrial fibrillation so DVT is less likely    Updated Medication List Outpatient Encounter Prescriptions as of 05/02/2014  Medication Sig  . acetaminophen (TYLENOL) 500 MG tablet Take 500 mg by mouth as needed.   Marland Kitchen losartan-hydrochlorothiazide (HYZAAR) 100-12.5 MG per tablet Take 0.5 tablets by mouth daily.   . metoprolol succinate (TOPROL-XL) 25 MG 24 hr tablet Take by mouth every morning. Takes 1/2 tablet  . simvastatin (ZOCOR) 40 MG tablet Take 1 tablet (40 mg total) by mouth every morning.  . traMADol (ULTRAM) 50 MG tablet Take 1 tablet by mouth every 6 (six) hours as needed.  . warfarin (COUMADIN) 4 MG tablet Take 5 mg by mouth every morning.

## 2014-05-03 ENCOUNTER — Encounter: Payer: Self-pay | Admitting: Internal Medicine

## 2014-05-03 DIAGNOSIS — R609 Edema, unspecified: Secondary | ICD-10-CM

## 2014-05-03 DIAGNOSIS — I739 Peripheral vascular disease, unspecified: Secondary | ICD-10-CM

## 2014-05-03 DIAGNOSIS — Z9889 Other specified postprocedural states: Secondary | ICD-10-CM | POA: Insufficient documentation

## 2014-05-03 LAB — HEPATITIS C ANTIBODY: HCV Ab: NEGATIVE

## 2014-05-03 LAB — FOLATE RBC: RBC Folate: >1000 ng/mL (ref >280)

## 2014-05-03 NOTE — Assessment & Plan Note (Signed)
Secondary to traumatic rupture. Slow to heal,  continue PT

## 2014-05-03 NOTE — Assessment & Plan Note (Signed)
With vertigo suggesting  loss of proprioception.  B12 is low today and will be replaced.   Lab Results  Component Value Date   FOLATE 13.5 01/05/2010   Lab Results  Component Value Date   VITAMINB12 238 05/02/2014

## 2014-05-03 NOTE — Assessment & Plan Note (Signed)

## 2014-05-03 NOTE — Assessment & Plan Note (Signed)
Well controlled on current regimen. Renal function stable, no changes today.  Lab Results  Component Value Date   CREATININE 1.2 05/02/2014   Lab Results  Component Value Date   NA 141 05/02/2014   K 4.5 05/02/2014   CL 105 05/02/2014   CO2 30 05/02/2014

## 2014-05-03 NOTE — Assessment & Plan Note (Signed)
thrombocytopenia is stable and not < 100K  Lab Results  Component Value Date   PLT 113.0* 05/02/2014

## 2014-05-03 NOTE — Assessment & Plan Note (Signed)
With recent episodes of spontaneous bleeding. INRis therapeutic, and thrombocytopenia is stable and not < 100K  . Lab Results  Component Value Date   INR 2.6* 05/19/2013   INR 1.5* 05/14/2013   INR 1.11 05/06/2013   Lab Results  Component Value Date   WBC 6.1 05/02/2014   HGB 14.5 05/02/2014   HCT 44.0 05/02/2014   MCV 92.8 05/02/2014   PLT 113.0* 05/02/2014

## 2014-05-04 ENCOUNTER — Ambulatory Visit (INDEPENDENT_AMBULATORY_CARE_PROVIDER_SITE_OTHER): Payer: Medicare Other | Admitting: *Deleted

## 2014-05-04 DIAGNOSIS — E538 Deficiency of other specified B group vitamins: Secondary | ICD-10-CM

## 2014-05-04 MED ORDER — CYANOCOBALAMIN 1000 MCG/ML IJ SOLN
1000.0000 ug | Freq: Once | INTRAMUSCULAR | Status: AC
  Administered 2014-05-04: 1000 ug via INTRAMUSCULAR

## 2014-05-05 DIAGNOSIS — R609 Edema, unspecified: Secondary | ICD-10-CM | POA: Insufficient documentation

## 2014-05-05 NOTE — Assessment & Plan Note (Signed)
chronci asymmetry of legs.  L > R edema.  Ref to [facility] for evalaution by ultrasound for venous insufficiency

## 2014-05-10 DIAGNOSIS — M79609 Pain in unspecified limb: Secondary | ICD-10-CM | POA: Insufficient documentation

## 2014-05-10 NOTE — Assessment & Plan Note (Addendum)
Mild accompanied by persistent swelling in left lower leg.  He has PAD and signs of venous insufficiency with no prior evaluation of circulation , and had orthopedic surgery 7 weeks ago on his shoulder.  Referral to [facility] for comprehensive evaluation of ciruclation in leg leg to rule out DVT an, VI and PAD.  He has been taking coumadin chronically for atrial fibrillation so DVT is less likely

## 2014-05-11 ENCOUNTER — Ambulatory Visit (INDEPENDENT_AMBULATORY_CARE_PROVIDER_SITE_OTHER): Payer: Medicare Other | Admitting: *Deleted

## 2014-05-11 ENCOUNTER — Telehealth: Payer: Self-pay | Admitting: *Deleted

## 2014-05-11 DIAGNOSIS — E538 Deficiency of other specified B group vitamins: Secondary | ICD-10-CM

## 2014-05-11 MED ORDER — CYANOCOBALAMIN 1000 MCG/ML IJ SOLN
1000.0000 ug | Freq: Once | INTRAMUSCULAR | Status: AC
  Administered 2014-05-11: 1000 ug via INTRAMUSCULAR

## 2014-05-11 MED ORDER — CYANOCOBALAMIN 1000 MCG SL SUBL: 1.0000 | SUBLINGUAL_TABLET | Freq: Every day | SUBLINGUAL | Status: DC

## 2014-05-11 NOTE — Telephone Encounter (Signed)
Pt will be getting 3rd B12 injection next week. Would like sublingual B12 Rx sent to [place] for after injections are completed

## 2014-05-11 NOTE — Telephone Encounter (Signed)
rx for SL B12 sent

## 2014-05-18 ENCOUNTER — Ambulatory Visit (INDEPENDENT_AMBULATORY_CARE_PROVIDER_SITE_OTHER): Payer: Medicare Other | Admitting: *Deleted

## 2014-05-18 DIAGNOSIS — E538 Deficiency of other specified B group vitamins: Secondary | ICD-10-CM

## 2014-05-18 MED ORDER — CYANOCOBALAMIN 1000 MCG/ML IJ SOLN
1000.0000 ug | Freq: Once | INTRAMUSCULAR | Status: AC
  Administered 2014-05-18: 1000 ug via INTRAMUSCULAR

## 2014-05-31 ENCOUNTER — Telehealth: Payer: Self-pay | Admitting: Internal Medicine

## 2014-05-31 DIAGNOSIS — R609 Edema, unspecified: Secondary | ICD-10-CM

## 2014-05-31 NOTE — Telephone Encounter (Signed)
Please cancel the prior message about the diuretic .  He was given a diagnosis of venous insufficiency during his evaluation and was given compression stockings

## 2014-05-31 NOTE — Telephone Encounter (Signed)
His lower extremity swelling was not due to a blood clot or an incompetent vein. If he wants to try a low dose of a diuretic, I  will send one to his pharmacy

## 2014-05-31 NOTE — Assessment & Plan Note (Signed)
Secondary to VI per [facility];   Compression stockings prescribed.

## 2014-06-01 NOTE — Telephone Encounter (Signed)
Per Dr. Derrel Nip disregard.

## 2014-06-06 ENCOUNTER — Ambulatory Visit (INDEPENDENT_AMBULATORY_CARE_PROVIDER_SITE_OTHER): Payer: Medicare Other | Admitting: Podiatry

## 2014-06-06 DIAGNOSIS — B351 Tinea unguium: Secondary | ICD-10-CM

## 2014-06-06 DIAGNOSIS — M79676 Pain in unspecified toe(s): Secondary | ICD-10-CM

## 2014-06-06 NOTE — Progress Notes (Signed)
Presents today chief complaint of painful elongated toenails.  Objective: Pulses are palpable bilateral nails are thick, yellow dystrophic onychomycosis and painful palpation.   Assessment: Onychomycosis with pain in limb.  Plan: Treatment of nails in thickness and length as covered service secondary to pain.  

## 2014-07-05 DIAGNOSIS — H9193 Unspecified hearing loss, bilateral: Secondary | ICD-10-CM | POA: Insufficient documentation

## 2014-07-22 ENCOUNTER — Encounter: Payer: Self-pay | Admitting: Internal Medicine

## 2014-08-10 DIAGNOSIS — I340 Nonrheumatic mitral (valve) insufficiency: Secondary | ICD-10-CM | POA: Insufficient documentation

## 2014-08-10 DIAGNOSIS — I6523 Occlusion and stenosis of bilateral carotid arteries: Secondary | ICD-10-CM | POA: Insufficient documentation

## 2014-08-24 ENCOUNTER — Encounter: Payer: Self-pay | Admitting: *Deleted

## 2014-09-12 ENCOUNTER — Ambulatory Visit: Payer: Medicare Other | Admitting: Podiatry

## 2014-09-12 ENCOUNTER — Ambulatory Visit (INDEPENDENT_AMBULATORY_CARE_PROVIDER_SITE_OTHER): Payer: Medicare Other | Admitting: Podiatry

## 2014-09-12 DIAGNOSIS — B351 Tinea unguium: Secondary | ICD-10-CM | POA: Diagnosis not present

## 2014-09-12 DIAGNOSIS — M79676 Pain in unspecified toe(s): Secondary | ICD-10-CM | POA: Diagnosis not present

## 2014-09-12 NOTE — Progress Notes (Signed)
Presents today chief complaint of painful elongated toenails.  Objective: Pulses are palpable bilateral nails are thick, yellow dystrophic onychomycosis and painful palpation.   Assessment: Onychomycosis with pain in limb.  Plan: Treatment of nails in thickness and length as covered service secondary to pain.  

## 2014-11-28 ENCOUNTER — Ambulatory Visit: Payer: Medicare Other

## 2014-12-05 ENCOUNTER — Ambulatory Visit (INDEPENDENT_AMBULATORY_CARE_PROVIDER_SITE_OTHER): Payer: Medicare Other | Admitting: Podiatry

## 2014-12-05 DIAGNOSIS — M79676 Pain in unspecified toe(s): Secondary | ICD-10-CM | POA: Diagnosis not present

## 2014-12-05 DIAGNOSIS — B351 Tinea unguium: Secondary | ICD-10-CM | POA: Diagnosis not present

## 2014-12-05 NOTE — Progress Notes (Signed)
Presents today chief complaint of painful elongated toenails. Some soreness in R lat. Hallux  Corner.  Objective: Pulses are palpable bilateral nails are thick, yellow dystrophic onychomycosis and painful palpation.   Assessment: Onychomycosis with pain in limb.  Plan: Treatment of nails in thickness and length as covered service secondary to pain.Curette /trim R Lat. Hallux edge.

## 2014-12-20 NOTE — Consult Note (Signed)
Note Type Consult   HPI: Referred by John Giovanni, M.D.   This 81 year old Male patient presents to the clinic for follow up (1)pulmonary nodule.  Subjective:  Chief Complaint/Diagnosis:   Pulmonary nodule.  HPI:   Patient returns to clinic today for further evaluation and discussion of his CT results.  Currently, he feels well and is asymptomatic.  He has no neurologic complaints.  He denies any fevers.  He has a good appetite and denies weight loss.  He has no night sweats.  He has no chest pain, shortness of breath, or hemoptysis.  He denies any nausea, vomiting, constipation, or diarrhea.  He has no urinary complaints.  Patient offers no specific complaints today.   Review of Systems:   Performance Status (ECOG): 0   Pain ?: No complaints (0, none)   Emotional well-being: None   Review of Systems:   As per HPI. Otherwise, 10 point system review was negative.  Allergies:  No Known Allergies:   Preventive Screening:   Has patient had any of the following test? Prostate Exam (1)    Last Prostate Exam: 2012(1)   PFSH:  Additional Past Medical and Surgical History: Past medical history: Renal cell carcinoma, BPH, hypertension, atrial fibrillation.    Past surgical history: Last partial nephrectomy.    Family history: Lung cancer, CAD, hypertension, hypercholesterolemia.    Social history: Quit tobacco in 1978, occasional alcohol.   Home Medications: Medication Instructions Last Modified Date/Time  metoprolol succinate 25 mg oral tablet, extended release 1 tab(s) orally once a day 19-Sep-13 10:08  simvastatin 40 mg oral tablet 1 tab(s) orally once a day (at bedtime) 19-Sep-13 10:08  hydrochlorothiazide-losartan 12.5 mg-100 mg oral tablet 1 tab(s) orally once a day 19-Sep-13 10:08  warfarin 5 mg oral tablet 1 tab 4 days per week,  1/2 tab 3 days per week 19-Sep-13 10:08   Vital Signs:   :: Wt(KG): 93.9 Temp: 97.5 Pulse: 52 RR: 18 O2 Sat: 99  BP: 136/84   Physical  Exam:   General: well developed, well nourished, and in no acute distress   Mental Status: normal affect   Eyes: anicteric sclera   Respiratory: clear to auscultation bilaterally   Cardiovascular: regular rate and rhythm, no murmur, rub, or gallop   Gastrointestinal: soft, nondistended, nontender, no organomegaly.  normal active bowel sounds   Musculoskeletal: No edema   Skin: No rash or petechiae noted   Neurological: alert, answering all questions appropriately.  Cranial nerves grossly intact   Medical Imaging Results:    Review Medical Imaging   Chest and Abd With Contrast 19-May-2012 11:11:00: IMPRESSION:     1.  Stable areas exophytic from the lower pole regions of both kidneys.     The one inferiorly and laterally on the left shows some evidence of mild   enhancement. Follow-up is recommended.  2.  Multiple hepatic cysts.  3.  Stable cardiomegaly.  4.  Small pulmonary nodular densities in the right lung.    Dictation Site: 1    Thank you for this opportunity to contribute to the care of your patient.           Verified By: Sundra Aland, M.D., MD  Assessment and Plan:  Impression:   Pulmonary nodule.  Plan:   1.  Pulmonary nodule: Essentially unchanged.  Likely benign.  No further intervention is needed in no followup has been made in the [place]. History of renal cell carcinoma: Have instructed to  continue his yearly followups with Dr. Bernardo Heater.  Patient expressed understanding and was in agreement with this plan.    CC Referral:   cc: Dr. Bernardo Heater, Dr. Derrel Nip   Electronic Signatures: Delight Hoh (MD)  (Signed 19-Sep-13 14:29)  Authored: Note Type, History of Present Illness, CC/HPI, Review of Systems, ALLERGIES, Preventive Screening, Patient Family Social History, HOME MEDICATIONS, Vital Signs, Physical Exam, Rad Results Review, Assessment and Plan, CC Referring Physician   Last Updated: 19-Sep-13 14:29 by Delight Hoh (MD)  References: 1.  Data Referenced  From "[place] [facility] Nurse Note" 21-May-2012 10:05 AM

## 2014-12-23 NOTE — Discharge Summary (Signed)
PATIENT NAME:  Kyle Gutierrez, Kyle Gutierrez MR#:  [id number] DATE OF BIRTH:  04-26-1932  DATE OF ADMISSION:  05/12/2013 DATE OF DISCHARGE:  05/14/2013  FINAL DIAGNOSES: 1.  Acute pyelonephritis with gram-negative Escherichia coli bacteremia.  2.  Recent urologic procedure and status post catheterization recently for four days which was one week before presentation to the Emergency Room.  3.  Chronic atrial fibrillation.  4.  Hypertension.   CONDITION ON DISCHARGE:  Stable.   CODE STATUS ON DISCHARGE:  FULL CODE.   MEDICATIONS ON DISCHARGE: 1.  Simvastatin 40 mg once a day.  2.  Metoprolol 25 mg 1/2 tablet once a day.  3.  Hydrochlorothiazide 12.5 mg take 1/2 tablet once a day.  4.  Losartan 50 mg once a day.  5.  Coumadin 5 mg once a day.  6.  Cephalexin 500 mg oral capsule 4 times a day for 10 days.   DIET ON DISCHARGE:  Low sodium diet.  Consistency regular.   ACTIVITY:  As tolerated.   TIMEFRAME TO FOLLOW-UP:  Within 1 to 2 weeks.  Advised to follow with PMD in 4 to 5 days to check INR as it was low and Coumadin level was increased.     HISTORY OF PRESENT ILLNESS:  A 81 year old Caucasian male with past medical history of renal cell cancer status post partial nephrectomy and chronic history of intermittent episode of hematuria, recently seen by urologist and had a procedure which was almost a week ago including dilated renal stricture.  Following that, the patient was having indwelling Foley catheter for four days which was discontinued 3 to 4 days before presentation to hospital and patient started experiencing chills and rigors since then.  The previously day in the morning the patient was brought to the Emergency Room.  At that time in ER, blood cultures were collected, x-ray was negative, urinalysis was positive and he was discharged home with oral ciprofloxacin.  The patient took 2 tablets, but meanwhile he got a call back from the Emergency Room as his blood cultures were positive with  gram-negative rods and advised him to come back to the Emergency Room.  The patient was still feeling chills, rigors and back pain, but denied any shortness of breath or nausea or vomiting.    HOSPITAL COURSE AND STAY:  The patient was started on IV Levaquin by admitting doctor, but the next day culture results came back and they were showing gram negative rods E. Coli which were resistant to Levaquin, but sensitive to ceftriaxone, so he was started on IV ceftriaxone.  Repeat blood cultures were done in the hospital during this admission and they came out negative.  The patient was feeling very well on IV ceftriaxone so we discharged him on oral Keflex every six hours.   IMPORTANT LABORATORY RESULTS IN THE HOSPITAL:  Blood culture which was done on 05/11/2013 earlier presentation in the ER was showing E. coli, resistant to Levaquin and ciprofloxacin, but sensitive to ceftazidime and ceftriaxone.  Creatinine was 1.31.  Urinalysis was positive with 260 WBCs and 3+ leukocyte esterase.  Urine culture was positive with more than 100,000 colony-forming units of likely the same organism.  Repeat blood culture report on 11th of September was negative.  INR was 1.5 on 12 September.  Creatinine came down to 1.26 on 12 September.   Total time spent in this discharge 40 minutes.    ____________________________ Ceasar Lund Anselm Jungling, MD [id number] D: 05/18/2013 22:57:07 ET T: 05/18/2013 23:55:54 ET JOB#: [id number]  cc: Ceasar Lund. Anselm Jungling, MD, <Dictator> Deborra Medina, MD Vaughan Basta MD ELECTRONICALLY SIGNED 05/22/2013 15:01

## 2014-12-23 NOTE — H&P (Signed)
PATIENT NAME:  Kyle, Gutierrez MR#:  [id number] DATE OF BIRTH:  09-Nov-1931  DATE OF ADMISSION:  05/12/2013  PRIMARY CARE PHYSICIAN: Dr. Deborra Medina.   REFERRING PHYSICIAN : Dr. Dahlia Client.   CHIEF COMPLAINT: Positive blood culture, chills, back pain.   HISTORY OF PRESENT ILLNESS: The patient is a 81 year old Caucasian male with a past medical history of renal cell cancer status post partial nephrectomy and chronic history of intermittent episodes of hematuria, was recently seen by urologist and he had a procedure last Tuesday, including dilated renal stricture. Following, the patient was placed on indwelling Foley catheter for four days, which was removed on Monday morning. Following that, from Monday night onwards he was experiencing chills, rigors and shakes. Yesterday morning the patient was brought into the ER through the EMS and they did  blood cultures. Chest x-ray was negative, and he was discharged home with p.o. ciprofloxacin. The patient took two doses of p.o. ciprofloxacin but meanwhile he got a call from the ER stating that his blood cultures were positive. The patient comes in to the ER because the ER charge nurse has called him with positive blood cultures, which has revealed gram-negative rods. The patient is still feeling the same way with chills, rigors and back pain. Denies any chest pain, shortness of breath. No nausea or vomiting.   PAST MEDICAL HISTORY: Renal cell cancer , benign prostatic hypertrophy, hypertension, atrial fibrillation, chronic history of intermittent episodes of hematuria followed by a neurologist, pulmonary nodule seen by oncologist, Dr. Grayland Ormond. Two CAT scans in the past six months were normal as reported by the patient.   PAST SURGICAL HISTORY: Partial nephrectomy.   ALLERGIES: No known drug allergies.   PSYCHOSOCIAL HISTORY: Lives at [facility] with wife. Denies history of smoking, alcohol or illicit drug usage. He quit smoking in  1978.   FAMILY HISTORY: Lung cancer, coronary artery disease, hypertension, hyperlipidemia.  REVIEW OF SYSTEMS: CONSTITUTIONAL: Complaining of low-grade fever, fatigue, weakness, back pain.  EYES: Denies blurry vision, glaucoma.  EARS, NOSE, THROAT: No epistaxis, discharge.  RESPIRATION: Denies cough, COPD.  CARDIOVASCULAR: Denies chest pain, palpitations.  GASTROINTESTINAL: Denies nausea, vomiting, diarrhea.  GENITOURINARY: Had chronic history of hematuria and renal cell cancer in the past, status post partial nephrectomy.  ENDOCRINE: Denies polyuria, nocturia, thyroid problems.  HEM/LYMPH : Denies easy  bruising, bleeding.  INTEGUMENTARY: No acne, rash, lesions.  MUSCULOSKELETAL: No joint pain in the neck, back, shoulder.  NEUROLOGICAL: No vertigo or ataxia. PSYCHIATRIC: No ADD, OCD.   PHYSICAL EXAMINATION: VITAL SIGNS: Temperature 99.1, pulse 98, respirations 18, blood pressure 129/61, pulse oximetry 92% on 2 liters.  GENERAL APPEARANCE: Not in acute distress. Moderately built and nourished .  HEENT: Normocephalic, atraumatic. Pupils are equally reacting to light and accommodation. No scleral icterus. No conjunctival injection. No sinus tenderness. No postnasal drip.  NECK: Supple. No JVD. No thyromegaly. No lymphadenopathy.  LUNGS: Clear to auscultation bilaterally. No accessory muscle use and no anterior chest wall tenderness on palpation.  CARDIAC: S1, S2 normal. Regular rate and rhythm. No murmurs.  GASTROINTESTINAL: Soft. Bowel sounds are positive in all four quadrants. Nontender, nondistended. No hepatosplenomegaly. Positive costovertebral angle tenderness. NEUROLOGICAL:  Awake and oriented x3. Motor and sensory grossly intact. Reflexes are 2+.  EXTREMITIES: No edema. No cyanosis. No clubbing. The patient is hard of hearing.  SKIN: Warm to touch. Normal turgor. No rashes. No lesions.  PSYCHIATRIC: Normal mood and affect.   LABORATORY AND IMAGING STUDIES: Urinalysis yellow in  color, hazy  in appearance, pH of 5.0, nitrites positive, leukocyte esterase 3+, glucose negative. Lactic acid 1.1. Blood culture collected from 09/09 has revealed gram-negative rods in both bottles.  Yesterday's INR is 1.2. PT was 15.6. Today's PT-INR is pending. WBC 9.6, hemoglobin 13.2, hematocrit 38.5, platelet count 83, glucose 143, BUN 25,  potassium 3.6, chloride 109. GFR 51. Serum osmolality 284. Calcium 8.8. Anion gap 7.   ASSESSMENT AND PLAN:  A 81 year old pleasant Caucasian male presenting to the ER with a chief complaint of chills and rigors.  Blood cultures collected yesterday has revealed positive growth. Will be admitted with the following assessment and plan:  1. Sepsis with bacteremia and acute pyelonephritis. We will admit to inpatient status . We will provide him IV fluids. Repeat urine culture and blood cultures. We will him IV levofloxacin as white count is stable and the patient is afebrile. Follow up on yesterday's blood cultures and repeat blood cultures from today.  2. Chronic history of atrial fibrillation. The patient is on Coumadin. Today's PT-INR is pending at this time and depending on the PT-INR result we will continue Coumadin and titrate dose. If the patient's INR is subtherapeutic we will consider adding Lovenox or heparin subcutaneous regarding deep vein thrombosis prophylaxis.  3. Chronic history of hypertension. Resume his home medication.  4. Atrial fibrillation, chronic in nature, rate -controlled.  5. Benign prostatic hypertrophy. Resume his home medications.  6. We will provide him gastrointestinal prophylaxis.  7. Deep vein thrombosis prophylaxis. Based on the INR results, the patient is on Coumadin.  8. CODE STATUS: Undetermined at this time. He has medical living will and wife will bring it from home today. Wife is the medical power of attorney. The plan of care discussed in detail with the patient and his wife at bedside. They both verbalized understanding of  the plan. If necessary we will add urology consult.  ____________________________ Nicholes Mango, MD ag:sg D: 05/12/2013 07:37:00 ET T: 05/12/2013 08:03:32 ET JOB#: [id number]  cc: Nicholes Mango, MD, <Dictator>  Nicholes Mango MD ELECTRONICALLY SIGNED 05/22/2013 7:04

## 2014-12-24 NOTE — Op Note (Signed)
PATIENT NAME:  WOODFIN, KISS MR#:  [id number] DATE OF BIRTH:  19-Feb-1932  DATE OF PROCEDURE:  03/17/2014  HISTORY:  Patient has urinary retention post right shoulder surgery and rotator cuff surgery. He went into retention last night.  He has been dribbling. He has long-term history of stricture disease and post TURP. So today we do a filiform follower dilatation of the urethra.   DESCRIPTION OF PROCEDURE: With the patient sterilely prepped and draped in the supine position, I do a local anesthesia into the urethra with 2% Xylocaine gel. Than usual utilizing 4 filiforms, I managed to bypass a stricture and to go into the bladder. Then I dilate him with the followers utilizing the filiform up to 24 [place]. He tolerates this well. Then I am able to easily put a 16 [place] Coude catheter in his bladder, we drained about 300 mL off his bladder.  Connected him to gravity drainage. Foley can be removed in the morning. 10 mL of fluid are in the balloon.    ____________________________ Janice Coffin. Elnoria Howard, [place] rdh:ts D: 03/17/2014 17:20:30 ET T: 03/17/2014 17:25:20 ET JOB#: [id number]  cc: Janice Coffin. Elnoria Howard, DO, <Dictator> RICHARD D HART DO ELECTRONICALLY SIGNED 03/18/2014 15:59

## 2014-12-24 NOTE — Op Note (Signed)
PATIENT NAME:  Kyle Gutierrez, Kyle Gutierrez MR#:  [id number] DATE OF BIRTH:  1932/06/16  DATE OF PROCEDURE:  03/16/2014   PREOPERATIVE DIAGNOSIS: Right shoulder rotator cuff tear involving the supraspinatus, subacromial impingement, and acromioclavicular joint arthritis.   POSTOPERATIVE DIAGNOSIS: Right shoulder rotator cuff tear involving the supraspinatus, subacromial impingement, and acromioclavicular joint arthritis.   PROCEDURE: Right shoulder arthroscopic subacromial decompression, distal clavicle excision, debridement of the labrum and mini open rotator cuff repair.   ANESTHESIA: General, with right interscalene block.   SURGEON: Timoteo Gaul, M.D.   FINDINGS ON ARTHROSCOPY: Included a full-thickness tear involving the supraspinatus and the anterior portion of the infraspinatus, partial tear of the superior portion of the subscapularis, generalized fraying of the anterior and superior labrum.   ESTIMATED BLOOD LOSS: Minimal.   COMPLICATIONS: None.   IMPLANTS: ArthroCare Magnum 2 anchors x3, and ArthroCare Magnum M anchors x2.   INDICATIONS FOR PROCEDURE: The patient is an 80 year old male, who is very active at baseline. He injured his right shoulder in May when he slipped getting out of the shower. He had significant pain and limitation of motion in the right shoulder and demonstrated significant weakness to the supraspinatus. His MRI confirmed a full-thickness tear of the supraspinatus. The decision was made that given his pain and disability and high baseline functioning, that he would benefit from surgical fixation of the above-noted rotator cuff tear.   I reviewed the risks and benefits of surgery in my office with the patient and his wife prior to the date of surgery. He understands the risks include, but are not limited to infection, bleeding, nerve or blood vessel injury, shoulder stiffness, re-tear of the rotator cuff, persistent pain, weakness or instability, failure of the  implants and the need for further surgery. Medical risks include DVT and pulmonary embolism, myocardial infarction, stroke, pneumonia, respiratory failure and death. The patient has been cleared by Surgery, by his cardiologist, Dr. Nehemiah Massed. He had stopped his Coumadin that he takes for atrial fibrillation 4 to 5 days preoperatively. His preoperative INR today was 1.1.   PROCEDURE NOTE: The patient was brought to the operating room. He had a right interscalene block placed by the anesthesia service. He then underwent general endotracheal intubation and was then positioned in a beach chair position. All bony prominences were adequately padded, including the lower extremities. The patient had a spider arm positioner used for his right arm during this case. Examination under anesthesia revealed no gross instability to load and shift testing. He had a negative sulcus sign and full passive range of motion.   A timeout was performed to verify the patient's name, date of birth, medical record number, correct site of surgery, and correct procedure to be performed. It was also used to verify the patient had received antibiotics and that all appropriate instruments, implants and radiographic studies were available in the room. Once all in attendance were in agreement, the case began.   The patient had bony landmarks drawn out with a surgical marker along with proposed incisions. These were pre- injected with 1% lidocaine plain. An 11 blade was used to establish the posterior portal through which the arthroscope was placed into the glenohumeral joint. An anterior portal was then established using an 18-gauge spinal needle. A 5.75 mm arthroscopic cannula was placed through the anterior portal. A full diagnostic examination was undertaken. The findings are noted above. The patient had a 4.0 resector shaver blade placed through the anterior portal and the frayed and torn superior and anterior  labrum was debrided using this  shaver blade. The labrum was then probed and found to have no areas of detachment from the bony glenoid. The patient had spontaneously ruptured his proximal biceps tendon. The remaining biceps stump attached to the superior labrum was debrided again using a 4-0 resector shaver blade. The subscapularis tendon had a partial tear involving the superior fibers. There was no area of complete tearing and no intervention was felt to be necessary for the subscapularis. The 4-0resector shaver blade was used to gently debride the supraspinatus tear, which extended through the anterior fibers of the infraspinatus. A lateral portal was then established, again using an 18-gauge spinal needle. Through this, lateral portal, 2 ArthroCare Smart stitches were placed in the lateral border of the rotator cuff. These provided traction sutures, which demonstrated that the rotator cuff was easily reducible with traction. The final images of the glenohumeral joint were then taken.   The arthroscope was then placed through the posterior portal into the subacromial space. A bursectomy was performed using a 4-0 resector shaver blade and 90-degree ArthroCare wand through the lateral portal. A 5.5 mm resector shaver blade was then used to perform a subacromial decompression through the lateral portal and then a distal clavicle excision through the anterior portal. Images of this portion of the procedure were taken with an arthroscope. All arthroscopic instruments were then removed after the subacromial space was debrided of any remaining bone debris. Arthroscopic instruments were then removed.   A #10 blade was used to make a saber-type incision along the lateral border of the acromion. The deltoid muscle was split in line with its fibers. A self-retaining shoulder retractor was then placed to allow for visualization of the rotator cuff tear. The 2 Smart stitch anchors were then brought out through the deltoid split. A third Smart stitch  was placed into the lateral border of the rotator cuff. A 5.5 mm resector shaver blade was then used to debride the greater tuberosity of all remaining torn fibers of the rotator cuff until punctate bleeding was visualized. The greater tuberosity, however, was not decorticated.   Two Magnum M anchors were then placed at the articular margin of the humeral head. These were doubly loaded anchors. Both sutures from each of the anchors was then passed medially through the rotator cuff and clamped with a hemostat for later medial row repair. Three Magnum 2 anchors were then placed laterally, loading 1 Smart stitch into each of the Magnum 2 anchors. These were then tensioned to allow for anatomic reduction of the rotator cuff onto the greater tuberosity. Once the 3 Magnum 2 anchors had been placed and the cuff was in good position, the suture tails were cut.   The attention was then turned to performing the medial row fixation. The medial row sutures from the Magnum M anchors were then tied using an arthroscopic knot pusher. These were then cut with an arthroscopic suture cutter. External images of the rotator cuff repair were then taken with the arthroscope. The wound was copiously irrigated. The self-retaining retractor was removed. The arthroscope was then placed back into the glenohumeral joint through the posterior portal, and arthroscopic images of the rotator cuff repair were taken.   All arthroscopic instruments were then removed. The deltoid fascia was closed with a 0 interrupted Vicryl suture. The subcutaneous tissue closed with 2-0 Vicryl, and the skin of the saber incision was closed with a running 4-0 undyed Monocryl. The 3 arthroscopic portals were then closed with 2-0  Vicryl and then 4-0 nylon for the skin. A dry sterile dressing was applied along with a TENS unit, pads, a Polar Care sleeve, and an abduction sling. The patient had sensitivity to adhesive, and paper tape was used for his dressing.    I was scrubbed and present for the entire case, and all sharp and instrument counts were correct at the conclusion of the case. I spoke with the patient's wife and son in the postoperative consultation room following surgery to let them know the patient was stable in the recovery room and his case had gone without complication.    ____________________________ Timoteo Gaul, MD klk:[id number] D: 03/18/2014 18:56:51 ET T: 03/18/2014 19:20:58 ET JOB#: [id number]  cc: Timoteo Gaul, MD, <Dictator> Timoteo Gaul MD ELECTRONICALLY SIGNED 03/24/2014 1:15

## 2015-01-29 ENCOUNTER — Emergency Department
Admission: EM | Admit: 2015-01-29 | Discharge: 2015-01-29 | Disposition: A | Discharge status: Home / Self Care | Payer: Medicare Other | Attending: Emergency Medicine | Admitting: Emergency Medicine

## 2015-01-29 DIAGNOSIS — Z7901 Long term (current) use of anticoagulants: Secondary | ICD-10-CM | POA: Diagnosis not present

## 2015-01-29 DIAGNOSIS — L500 Allergic urticaria: Secondary | ICD-10-CM

## 2015-01-29 DIAGNOSIS — Z79899 Other long term (current) drug therapy: Secondary | ICD-10-CM | POA: Diagnosis not present

## 2015-01-29 DIAGNOSIS — I10 Essential (primary) hypertension: Secondary | ICD-10-CM | POA: Insufficient documentation

## 2015-01-29 DIAGNOSIS — Z87891 Personal history of nicotine dependence: Secondary | ICD-10-CM | POA: Insufficient documentation

## 2015-01-29 DIAGNOSIS — L509 Urticaria, unspecified: Secondary | ICD-10-CM | POA: Diagnosis present

## 2015-01-29 DIAGNOSIS — T50905A Adverse effect of unspecified drugs, medicaments and biological substances, initial encounter: Secondary | ICD-10-CM

## 2015-01-29 MED ORDER — PREDNISONE 20 MG PO TABS
40.0000 mg | ORAL_TABLET | Freq: Once | ORAL | Status: AC
  Administered 2015-01-29: 40 mg via ORAL

## 2015-01-29 MED ORDER — PREDNISONE 20 MG PO TABS: 40.0000 mg | ORAL_TABLET | Freq: Every day | ORAL | Status: DC

## 2015-01-29 MED ORDER — PREDNISONE 20 MG PO TABS
ORAL_TABLET | ORAL | Status: AC
  Filled 2015-01-29: qty 2

## 2015-01-29 NOTE — ED Notes (Signed)
Pt started having itching sensation to his skin around 3pm yesterday afternoon; around 7pm he began with hives to body, has worsened in the last 30 minutes; took '50mg'$  benadryl 30 min pta

## 2015-01-29 NOTE — ED Provider Notes (Signed)
[facility] Emergency Department Provider Note  ____________________________________________  Time seen: Approximately 5:45 AM  I have reviewed the triage vital signs and the nursing notes.   HISTORY  Chief Complaint Urticaria    HPI Kyle Gutierrez is a 81 y.o. male who began having itching and a light rash and "hives" over his arms and legs yesterday afternoon. He states during the evening he's been itching all night, and is noticed the rash is slightly worsening over the arms and legs during the evening. He took Benadryl at home. He does not know of any one thing that would've caused this, however he was recently on Flagyl for a week which she stopped approximately 12 days ago for an episode of diverticulitis which has resolved.  He denies any previous history of allergic reaction, except for once last year when he had a similar episode from an unknown cause.  Denies any pain. No facial swelling, no difficulty breathing, no wheezing, no lightheadedness, no chest pain.  Past Medical History  Diagnosis Date  . Hypertension   . Substance abuse     alcohol  . Thrombocytopenia, unspecified   . Neuropathy, alcoholic     per Neurologic eval, remote  . Retinopathy     followed by [facility]  . Chronic atrial fibrillation   . Coronary artery disease, non-occlusive   . Carotid artery plaque     bilateral  . Valvular cardiomyopathy     severe mitral and tricuspid insufficiency, ECHO July 2012  . Benign prostatic hypertrophy     s/p TURP  . renal cell carcinoma     left kidney cancer    Patient Active Problem List   Diagnosis Date Noted  . Edema 05/31/2014  . Pain in limb 05/10/2014  . S/P rotator cuff surgery 05/03/2014  . Dizziness and giddiness 05/02/2014  . Cough 08/15/2013  . History of tobacco abuse 08/13/2013  . OSA (obstructive sleep apnea) 06/01/2013  . Pyelonephritis 05/20/2013  . Myoclonic jerking while sleeping 05/20/2013  . Routine  general medical examination at a health care facility 04/16/2013  . Long term (current) use of anticoagulants 08/11/2012  . Hematuria, gross 04/11/2012  . Chronic atrial fibrillation   . Coronary artery disease, non-occlusive   . Carotid artery plaque   . Valvular cardiomyopathy   . renal cell carcinoma   . Benign prostatic hypertrophy   . Screening for colon cancer 02/14/2012  . Retinopathy   . Pulmonary nodule/lesion, solitary 08/16/2011  . Substance abuse   . Thrombocytopenia, unspecified   . Neuropathy, alcoholic   . Peripheral vascular disease 08/06/2011  . LUMBAR RADICULOPATHY, LEFT 05/02/2009  . PERIPHERAL NEUROPATHY 01/05/2009  . HYPERTENSION 12/05/2008  . ATRIAL FIBRILLATION 12/05/2008  . SKIN CANCER, HX OF 12/05/2008  . COLONIC POLYPS, HX OF 12/05/2008  . DIVERTICULITIS, HX OF 12/05/2008    Past Surgical History  Procedure Laterality Date  . Transurethral resection of prostate  2003  . Prostate surgery    . Eye surgery      bilateral cataracts removed  . Partial nephrectomy Left     10'02  . Cystoscopy w/ retrogrades N/A 05/06/2013    Procedure: CYSTOSCOPY WITH RETROGRADE PYELOGRAM,  BALLOON DILATION OF URETHRAL STRICTURE;  Surgeon: Dutch Gray, MD;  Location: [facility];  Service: Urology;  Laterality: N/A;    Current Outpatient Rx  Name  Route  Sig  Dispense  Refill  . acetaminophen (TYLENOL) 500 MG tablet   Oral   Take 500 mg by mouth  as needed.          . Cyanocobalamin 1000 MCG SUBL   Sublingual   Place 1 tablet (1,000 mcg total) under the tongue daily.   90 tablet   3   . losartan-hydrochlorothiazide (HYZAAR) 100-12.5 MG per tablet   Oral   Take 0.5 tablets by mouth daily.          . metoprolol succinate (TOPROL-XL) 25 MG 24 hr tablet   Oral   Take by mouth every morning. Takes 1/2 tablet         . simvastatin (ZOCOR) 40 MG tablet   Oral   Take 1 tablet (40 mg total) by mouth every morning.   90 tablet   2   . traMADol (ULTRAM) 50 MG  tablet   Oral   Take 1 tablet by mouth every 6 (six) hours as needed.         . warfarin (COUMADIN) 4 MG tablet   Oral   Take 5 mg by mouth every morning.            Allergies Adhesive  Family History  Problem Relation Age of Onset  . Cancer Sister     Breast  . Heart disease Mother 50    heart failure  . Early death Father 90  . Heart disease Father     CAD    Social History History  Substance Use Topics  . Smoking status: Former Smoker    Quit date: 03/29/1977  . Smokeless tobacco: Never Used  . Alcohol Use: Yes     Comment: daily 5-6 ounces wine    Review of Systems Constitutional: No fever/chills Eyes: No visual changes. ENT: No sore throat. Cardiovascular: Denies chest pain. Respiratory: Denies shortness of breath. Gastrointestinal: No abdominal pain.  No nausea, no vomiting.  No diarrhea.  No constipation. Genitourinary: Negative for dysuria. Musculoskeletal: Negative for back pain. Skin: See history of present illness Neurological: Negative for headaches, focal weakness or numbness.  Diverticulitis resolved. No longer having diarrhea or pain in the abdomen.  10-point ROS otherwise negative.  ____________________________________________   PHYSICAL EXAM:  VITAL SIGNS: ED Triage Vitals  Enc Vitals Group     BP 01/29/15 0323 145/81 mmHg     Pulse Rate 01/29/15 0323 66     Resp 01/29/15 0323 18     Temp 01/29/15 0323 97.5 F (36.4 C)     Temp Source 01/29/15 0323 Oral     SpO2 01/29/15 0323 97 %     Weight 01/29/15 0323 210 lb (95.255 kg)     Height 01/29/15 0323 '6\' 1"'$  (1.854 m)     Head Cir --      Peak Flow --      Pain Score 01/29/15 0326 0     Pain Loc --      Pain Edu? --      Excl. in [place]? --     Constitutional: Alert and oriented. Well appearing and in no acute distress. Eyes: Conjunctivae are normal. PERRL. EOMI. Head: Atraumatic. Nose: No congestion/rhinnorhea. Mouth/Throat: Mucous membranes are moist.  Oropharynx  non-erythematous. Neck: No stridor.   Cardiovascular: Normal rate, regular rhythm. Grossly normal heart sounds.  Good peripheral circulation. Respiratory: Normal respiratory effort.  No retractions. Lungs CTAB. Gastrointestinal: Soft and nontender. No distention. No abdominal bruits. No CVA tenderness. Musculoskeletal: No lower extremity tenderness nor edema.  No joint effusions. Neurologic:  Normal speech and language. No gross focal neurologic deficits are appreciated. Speech is normal.  No gait instability. Skin:  Skin is warm, dry and intact. No rash noted, except for occasional urticaria with slight maculopapular appearance appearing in patches over the thighs and forearms bilaterally. There is no evidence of cellulitis or infection.  There are no ecchymoses or bruising noted.  Psychiatric: Mood and affect are normal. Speech and behavior are normal.  ____________________________________________   LABS (all labs ordered are listed, but only abnormal results are displayed)  Labs Reviewed - No data to display ____________________________________________  EKG   ____________________________________________  RADIOLOGY   ____________________________________________   PROCEDURES  Procedure(s) performed: None  Critical Care performed: No  ____________________________________________   INITIAL IMPRESSION / ASSESSMENT AND PLAN / ED COURSE  Pertinent labs & imaging results that were available during my care of the patient were reviewed by me and considered in my medical decision making (see chart for details).  Patient reports itching has improved some. The rash has not advanced such taking Benadryl. He was recently on new medicine including Flagyl, other medications he's been on stable doses for years he says. He does take Coumadin, however there is no evidence of bruising or bleeding.  His rash appears most consistent with mild erythema multiforme. This may be a delayed  reaction to and about a, possibly Flagyl. I discussed with the patient. His symptoms are improved and there is certainly no evidence of anaphylaxis or systemic reaction. Appears localized to the dermis of the forearms and thighs. No evidence of Stevens-Johnson.  We will treat him with prednisone for an additional 4 days, he will continue over-the-counter Benadryl, and we discussed return precautions. He'll return to the ER right away she developed facial swelling, wheezing, difficulty breathing, lightheadedness, or other new concerns arise. He will follow-up with his primary care physician early this week.  In addition, we did discuss that if his rash begins to blister, boil, or continues to extend despite treatment with the drill and prednisone that he needs to return to the ER right away as this could be an early, but severe allergic reaction. ____________________________________________   FINAL CLINICAL IMPRESSION(S) / ED DIAGNOSES  Urticaria, initial, acute possibly secondary to Flagyl.   Delman Kitten, MD 01/29/15 [phone]

## 2015-01-29 NOTE — ED Notes (Signed)
Patient with no complaints at this time. Respirations even and unlabored. Skin warm/dry. Discharge instructions reviewed with patient at this time. Patient given opportunity to voice concerns/ask questions. Patient discharged at this time and left Emergency Department with steady gait.   

## 2015-01-29 NOTE — Discharge Instructions (Signed)
Hives  I would advise against use of Flagyl in the future, may be the cause of your itching and rash. Return to the ER right away should she develop any worsening rash, blistering, boils, weakness, difficulty breathing, tongue or facial swelling, or other new concerns arise.  Hives are itchy, red, swollen areas of the skin. They can vary in size and location on your body. Hives can come and go for hours or several days (acute hives) or for several weeks (chronic hives). Hives do not spread from person to person (noncontagious). They may get worse with scratching, exercise, and emotional stress.    CAUSES   Allergic reaction to food, additives, or drugs.  Infections, including the common cold.  Illness, such as vasculitis, lupus, or thyroid disease.  Exposure to sunlight, heat, or cold.  Exercise.  Stress.  Contact with chemicals. SYMPTOMS   Red or white swollen patches on the skin. The patches may change size, shape, and location quickly and repeatedly.  Itching.  Swelling of the hands, feet, and face. This may occur if hives develop deeper in the skin. DIAGNOSIS  Your caregiver can usually tell what is wrong by performing a physical exam. Skin or blood tests may also be done to determine the cause of your hives. In some cases, the cause cannot be determined. TREATMENT  Mild cases usually get better with medicines such as antihistamines. Severe cases may require an emergency epinephrine injection. If the cause of your hives is known, treatment includes avoiding that trigger.  HOME CARE INSTRUCTIONS   Avoid causes that trigger your hives.  Take antihistamines as directed by your caregiver to reduce the severity of your hives. Non-sedating or low-sedating antihistamines are usually recommended. Do not drive while taking an antihistamine.  Take any other medicines prescribed for itching as directed by your caregiver.  Wear loose-fitting clothing.  Keep all follow-up  appointments as directed by your caregiver. SEEK MEDICAL CARE IF:   You have persistent or severe itching that is not relieved with medicine.  You have painful or swollen joints. SEEK IMMEDIATE MEDICAL CARE IF:   You have a fever.  Your tongue or lips are swollen.  You have trouble breathing or swallowing.  You feel tightness in the throat or chest.  You have abdominal pain. These problems may be the first sign of a life-threatening allergic reaction. Call your local emergency services (911 in [place] MAKE SURE YOU:   Understand these instructions.  Will watch your condition.  Will get help right away if you are not doing well or get worse. Document Released: 08/19/2005 Document Revised: 08/24/2013 Document Reviewed: 11/12/2011 [facility] Patient Information 2015 [place], [place]. This information is not intended to replace advice given to you by your health care provider. Make sure you discuss any questions you have with your health care provider.

## 2015-02-27 ENCOUNTER — Ambulatory Visit: Payer: Medicare Other

## 2015-02-28 ENCOUNTER — Ambulatory Visit (INDEPENDENT_AMBULATORY_CARE_PROVIDER_SITE_OTHER): Payer: Medicare Other | Admitting: Podiatry

## 2015-02-28 DIAGNOSIS — M79676 Pain in unspecified toe(s): Secondary | ICD-10-CM | POA: Diagnosis not present

## 2015-02-28 DIAGNOSIS — B351 Tinea unguium: Secondary | ICD-10-CM | POA: Diagnosis not present

## 2015-02-28 NOTE — Progress Notes (Signed)
Subjective: 81 y.o. male returns the office today for painful, elongated, thickened toenails which he is unable to trim himself. Denies any redness or drainage around the nails. Denies any acute changes since last appointment and no new complaints today. Denies any systemic complaints such as fevers, chills, nausea, vomiting.   Objective: AAO 3, NAD DP/PT pulses palpable, CRT less than 3 seconds Nails hypertrophic, dystrophic, elongated, brittle, discolored 10. There is tenderness overlying the nails 1-5 bilaterally. There is no surrounding erythema or drainage along the nail sites. At the distal aspect of the left hallux or appears to be a flat, old hemorrhagic-type bulla without any fluid in the area. There is no swelling erythema, drainage or other clinical signs of infection. No open lesions or pre-ulcerative lesions are identified. No other areas of tenderness bilateral lower extremities. No overlying edema, erythema, increased warmth. No pain with calf compression, swelling, warmth, erythema.  Assessment: Patient presents with symptomatic onychomycosis  Plan: -Treatment options including alternatives, risks, complications were discussed -Nails sharply debrided 10 without complication/bleeding. -Continue monitor the area at the left distal hallux. There is any changes call the office or if is not healed within 2 weeks to call the office. Monitor for any clinical signs or symptoms of infection and directed to call the office immediately should any occur or go to the ER. -Discussed daily foot inspection. If there are any changes, to call the office immediately.  -Follow-up in 3 months or sooner if any problems are to arise. In the meantime, encouraged to call the office with any questions, concerns, changes symptoms.  Celesta Gentile, DPM

## 2015-03-28 ENCOUNTER — Other Ambulatory Visit: Payer: Self-pay | Admitting: Family Medicine

## 2015-03-28 DIAGNOSIS — R1031 Right lower quadrant pain: Secondary | ICD-10-CM

## 2015-04-03 ENCOUNTER — Ambulatory Visit
Admission: RE | Admit: 2015-04-03 | Discharge: 2015-04-03 | Disposition: A | Discharge status: Home / Self Care | Payer: Medicare Other | Source: Ambulatory Visit | Attending: Family Medicine | Admitting: Family Medicine

## 2015-04-03 DIAGNOSIS — R1031 Right lower quadrant pain: Secondary | ICD-10-CM | POA: Diagnosis present

## 2015-04-03 DIAGNOSIS — I517 Cardiomegaly: Secondary | ICD-10-CM | POA: Diagnosis not present

## 2015-04-03 DIAGNOSIS — I700 Atherosclerosis of aorta: Secondary | ICD-10-CM | POA: Diagnosis not present

## 2015-04-03 DIAGNOSIS — M4186 Other forms of scoliosis, lumbar region: Secondary | ICD-10-CM | POA: Diagnosis not present

## 2015-04-03 MED ORDER — IOHEXOL 300 MG/ML  SOLN
85.0000 mL | Freq: Once | INTRAMUSCULAR | Status: AC | PRN
  Administered 2015-04-03: 85 mL via INTRAVENOUS

## 2015-05-09 ENCOUNTER — Ambulatory Visit (INDEPENDENT_AMBULATORY_CARE_PROVIDER_SITE_OTHER): Payer: Medicare Other

## 2015-05-09 ENCOUNTER — Ambulatory Visit (INDEPENDENT_AMBULATORY_CARE_PROVIDER_SITE_OTHER): Payer: Medicare Other | Admitting: Podiatry

## 2015-05-09 ENCOUNTER — Encounter: Payer: Self-pay | Admitting: Podiatry

## 2015-05-09 VITALS — BP 156/87 | HR 64 | Resp 16

## 2015-05-09 DIAGNOSIS — L600 Ingrowing nail: Secondary | ICD-10-CM | POA: Diagnosis not present

## 2015-05-09 DIAGNOSIS — B351 Tinea unguium: Secondary | ICD-10-CM | POA: Diagnosis not present

## 2015-05-09 DIAGNOSIS — M79673 Pain in unspecified foot: Secondary | ICD-10-CM

## 2015-05-09 NOTE — Progress Notes (Signed)
He presents today with a chief complaint of painful elongated toenails that are cutting into the other toes and bleeding from the fifth digit of the right foot. He denies any trauma and relates he continues to exhibit signs of neuropathy.  Objective: Vital signs are stable he is alert and oriented 3. Pulses are followed palpable. Neurologic sensorium is diminished per Semmes-Weinstein monofilament. Deep tendon reflexes are intact. Muscle strength +5 over 5 dorsiflexion plantar flexors and inverters everters all of his musculature is intact. Cutaneous evaluation demonstrates a small laceration beneath the fifth toe which does not fit any signs of infection. There was some mild bleeding from this but we applied silver nitrate which stopped the bleeding immediately. His toenails are thick yellow dystrophic onychomycotic and elongated particularly the third digit of the right foot was irritating second resulting in an abrasion.  Assessment: Pain limb secondary to onychomycosis and small laceration beneath the fifth digit right foot.  Plan: Debridement of nails 1 through 5 bilaterally as a covered service. Also recommended soaking in Epsom salts and warm water. Follow-up with me in 11 weeks.

## 2015-05-29 ENCOUNTER — Ambulatory Visit: Payer: Medicare Other

## 2015-07-05 ENCOUNTER — Ambulatory Visit: Payer: Self-pay | Admitting: Urology

## 2015-07-06 ENCOUNTER — Ambulatory Visit: Payer: Self-pay | Admitting: Urology

## 2015-07-17 ENCOUNTER — Encounter: Payer: Self-pay | Admitting: Urology

## 2015-07-18 ENCOUNTER — Ambulatory Visit (INDEPENDENT_AMBULATORY_CARE_PROVIDER_SITE_OTHER): Payer: Medicare Other | Admitting: Urology

## 2015-07-18 ENCOUNTER — Encounter: Payer: Self-pay | Admitting: Urology

## 2015-07-18 VITALS — BP 160/80 | HR 80 | Ht 73.0 in | Wt 221.2 lb

## 2015-07-18 DIAGNOSIS — Z85528 Personal history of other malignant neoplasm of kidney: Secondary | ICD-10-CM

## 2015-07-18 NOTE — Progress Notes (Signed)
07/18/2015 3:29 PM   Kyle Gutierrez 1932/04/29 [id number]  Referring provider: Derinda Late, MD [phone] [facility] [place], [place] [address]  Chief Complaint  Patient presents with  . Follow-up    uretheral stricture    HPI: patient with the history of renal cell carcinoma A [place] left kidney and partial ureterectomy H PET scan negative for recurrent tumors on some history of stricture dilated in the hospital before recent shoulder surgery. This was almost 9 months ago patient is voiding well now and wants no instrumentation this time CT scan will be reviewed with the patient. Otherwise voiding well no urgency. Dysuria or nocturia. He has PSA work. Does not want another PSA is easy.   PMH: Past Medical History  Diagnosis Date  . Hypertension   . Substance abuse     alcohol  . Thrombocytopenia, unspecified ([place])   . Neuropathy, alcoholic ([facility])     per Neurologic eval, remote  . Retinopathy     followed by [facility]  . Chronic atrial fibrillation ([place])   . Coronary artery disease, non-occlusive   . Carotid artery plaque     bilateral  . Valvular cardiomyopathy ([facility])     severe mitral and tricuspid insufficiency, ECHO July 2012  . Benign prostatic hypertrophy     s/p TURP  . renal cell carcinoma 2002    left kidney cancer with partial nephrectomy.  [place] hematuria   . Nocturia   . Urinary frequency   . Urethral stricture     Surgical History: Past Surgical History  Procedure Laterality Date  . Transurethral resection of prostate  2003  . Prostate surgery    . Eye surgery      bilateral cataracts removed  . Partial nephrectomy Left     10'02  . Cystoscopy w/ retrogrades N/A 05/06/2013    Procedure: CYSTOSCOPY WITH RETROGRADE PYELOGRAM,  BALLOON DILATION OF URETHRAL STRICTURE;  Surgeon: Dutch Gray, MD;  Location: [facility];  Service: Urology;  Laterality: N/A;    Home Medications:    Medication List         This list is accurate as of: 07/18/15  3:29 PM.  Always use your most recent med list.               Cyanocobalamin 1000 MCG Subl  Place 1 tablet (1,000 mcg total) under the tongue daily.     losartan-hydrochlorothiazide 100-12.5 MG tablet  Commonly known as:  HYZAAR  Take 0.5 tablets by mouth daily.     metoprolol succinate 25 MG 24 hr tablet  Commonly known as:  TOPROL-XL  Take by mouth every morning. Takes 1/2 tablet     simvastatin 40 MG tablet  Commonly known as:  ZOCOR  Take 1 tablet (40 mg total) by mouth every morning.     warfarin 4 MG tablet  Commonly known as:  COUMADIN  Take 4 mg by mouth every morning.        Allergies:  Allergies  Allergen Reactions  . Adhesive [Tape] Rash  . Latex Rash    Family History: Family History  Problem Relation Age of Onset  . Cancer Sister     Breast  . Heart disease Mother 54    heart failure  . Early death Father 67  . Heart disease Father     CAD    Social History:  reports that he quit smoking about 38 years ago. He has never used smokeless  tobacco. He reports that he drinks alcohol. He reports that he does not use illicit drugs.  ROS: UROLOGY Frequent Urination?: No Hard to postpone urination?: No Burning/pain with urination?: No Get up at night to urinate?: Yes Leakage of urine?: No Urine stream starts and stops?: No Trouble starting stream?: No Do you have to strain to urinate?: No Blood in urine?: No Urinary tract infection?: No Sexually transmitted disease?: No Injury to kidneys or bladder?: No Painful intercourse?: No Weak stream?: No Erection problems?: No Penile pain?: No  Gastrointestinal Nausea?: No Vomiting?: No Indigestion/heartburn?: No Diarrhea?: No Constipation?: No  Constitutional Fever: No Night sweats?: No Weight loss?: No Fatigue?: No  Skin Skin rash/lesions?: No Itching?: No  Eyes Blurred vision?: No Double vision?: No  Ears/Nose/Throat Sore throat?: No Sinus  problems?: No  Hematologic/Lymphatic Swollen glands?: No Easy bruising?: No  Cardiovascular Leg swelling?: No Chest pain?: No  Respiratory Cough?: No Shortness of breath?: Yes  Endocrine Excessive thirst?: No  Musculoskeletal Back pain?: Yes Joint pain?: No  Neurological Headaches?: No Dizziness?: No  Psychologic Depression?: No Anxiety?: No  Physical Exam: BP 160/80 mmHg  Pulse 80  Ht '6\' 1"'$  (1.854 m)  Wt 221 lb 3.2 oz (100.336 kg)  BMI 29.19 kg/m2  Constitutional:  Alert and oriented, No acute distress. HEENT: [place] AT, moist mucus membranes.  Trachea midline, no masses. Cardiovascular: No clubbing, cyanosis, or edema. Respiratory: Normal respiratory effort, no increased work of breathing. GI: Abdomen is soft, nontender, nondistended, no abdominal masses GU: No CVA tenderness. rectal exam negative for rectal masses and sphincter tone the patient has a slightly enlarged prostate with grade 2 over 4 smooth firm nonnodular  He was alcohol. n: No rashes, bruises or suspicious lesions. Lymph: No cervical or inguinal adenopathy. Neurologic: Grossly intact, no focal deficits, moving all 4 extremities. Psychiatric: Normal mood and affect.  Laboratory Data: Lab Results  Component Value Date   WBC 6.1 05/02/2014   HGB 14.5 05/02/2014   HCT 44.0 05/02/2014   MCV 92.8 05/02/2014   PLT 113.0* 05/02/2014    Lab Results  Component Value Date   CREATININE 1.2 05/02/2014    Lab Results  Component Value Date   PSA 1.0 02/13/2012   PSA 0.82 02/28/2009    No results found for: TESTOSTERONE  Lab Results  Component Value Date   HGBA1C 5.4 01/05/2009    Urinalysis    Component Value Date/Time   COLORURINE Yellow 03/19/2014 1032   COLORURINE LT. YELLOW 08/11/2012 1000   APPEARANCEUR Clear 03/19/2014 1032   APPEARANCEUR CLEAR 08/11/2012 1000   LABSPEC 1.009 03/19/2014 1032   LABSPEC 1.015 08/11/2012 1000   PHURINE 7.0 03/19/2014 1032   PHURINE 7.0 08/11/2012  1000   GLUCOSEU Negative 03/19/2014 1032   GLUCOSEU NEGATIVE 08/11/2012 1000   GLUCOSEU Negative 02/13/2012 0833   HGBUR Negative 03/19/2014 1032   HGBUR TRACE-INTACT 08/11/2012 1000   BILIRUBINUR Negative 03/19/2014 1032   BILIRUBINUR NEGATIVE 08/11/2012 1000   BILIRUBINUR neg 03/30/2012 1524   BILIRUBINUR Negative 02/13/2012 0833   KETONESUR Trace 03/19/2014 1032   KETONESUR NEGATIVE 08/11/2012 1000   PROTEINUR Negative 03/19/2014 1032   PROTEINUR neg 03/30/2012 1524   UROBILINOGEN 0.2 08/11/2012 1000   UROBILINOGEN 0.2 03/30/2012 1524   NITRITE Negative 03/19/2014 1032   NITRITE NEGATIVE 08/11/2012 1000   NITRITE neg 03/30/2012 1524   NITRITE Negative 02/13/2012 0833   LEUKOCYTESUR Negative 03/19/2014 1032   LEUKOCYTESUR NEGATIVE 08/11/2012 1000   LEUKOCYTESUR Negative 02/13/2012 4854  Pertinent Imaging: And CT scan all reviewed below no  Assessment & Plan:  Health with a 1.1 PSA no previous prostate surgery. He he had a stricture which I dilated the hospital for recent surgery for shoulder he's not having any problem voiding at this time and wants nothing. Partial left nephrectomy in the past. He is recent CAT scan reveals no dangerous changes in his CT of his kidneys. There a previous is shrunken size.  Rectal exam was negative. He wants something more done other than a renal ultrasound here  There are no diagnoses linked to this encounter.  No Follow-up on file.  Collier Flowers, [facility] [address] [place], [place] [address] [phone]

## 2015-07-31 ENCOUNTER — Ambulatory Visit: Payer: Medicare Other | Admitting: Podiatry

## 2015-08-02 ENCOUNTER — Ambulatory Visit (INDEPENDENT_AMBULATORY_CARE_PROVIDER_SITE_OTHER): Payer: Medicare Other | Admitting: Podiatry

## 2015-08-02 ENCOUNTER — Encounter: Payer: Self-pay | Admitting: Podiatry

## 2015-08-02 DIAGNOSIS — B351 Tinea unguium: Secondary | ICD-10-CM | POA: Diagnosis not present

## 2015-08-02 DIAGNOSIS — M79676 Pain in unspecified toe(s): Secondary | ICD-10-CM

## 2015-08-02 NOTE — Progress Notes (Signed)
He presents today with chief complaint of painful elongated toenails.  Objective: I'll signs are stable he is alert and oriented 3 in no apparent distress. Nails are thick yellow dystrophic onychomycotic pulses remain palpable. Neurologic sensorium is intact.  Assessment: Pain in limb secondary to onychomycosis 1 through 5 bilateral.  Plan: Debridement of nails 1 through 5 bilateral covered service secondary to pain.

## 2015-10-02 ENCOUNTER — Encounter: Payer: Self-pay | Admitting: Podiatry

## 2015-10-02 ENCOUNTER — Ambulatory Visit (INDEPENDENT_AMBULATORY_CARE_PROVIDER_SITE_OTHER): Payer: Medicare Other | Admitting: Podiatry

## 2015-10-02 DIAGNOSIS — B351 Tinea unguium: Secondary | ICD-10-CM

## 2015-10-02 DIAGNOSIS — M79676 Pain in unspecified toe(s): Secondary | ICD-10-CM

## 2015-10-02 NOTE — Progress Notes (Signed)
He presents today with chief complaint of painful elongated toenails.  Objective: Vital signs stable alert and oriented 3. Pulses are palpable no open lesions or wounds. Toenails are thick yellow dystrophic onychomycotic and painful on palpation.  Assessment: Pain in limb secondary to onychomycosis.  Plan: Debridement toenails 1 through 5 bilateral.

## 2015-12-04 ENCOUNTER — Encounter (INDEPENDENT_AMBULATORY_CARE_PROVIDER_SITE_OTHER): Payer: Medicare Other | Admitting: Podiatry

## 2015-12-04 ENCOUNTER — Encounter: Payer: Self-pay | Admitting: Podiatry

## 2015-12-04 DIAGNOSIS — M79676 Pain in unspecified toe(s): Secondary | ICD-10-CM

## 2015-12-04 DIAGNOSIS — B351 Tinea unguium: Secondary | ICD-10-CM

## 2015-12-18 ENCOUNTER — Ambulatory Visit (INDEPENDENT_AMBULATORY_CARE_PROVIDER_SITE_OTHER): Payer: Medicare Other | Admitting: Podiatry

## 2015-12-18 ENCOUNTER — Encounter: Payer: Self-pay | Admitting: Podiatry

## 2015-12-18 DIAGNOSIS — B351 Tinea unguium: Secondary | ICD-10-CM | POA: Diagnosis not present

## 2015-12-18 DIAGNOSIS — M79676 Pain in unspecified toe(s): Secondary | ICD-10-CM

## 2015-12-18 NOTE — Progress Notes (Signed)
He presents today with chief complaint of painful elongated toenails.  Objective: His toenails are thick yellow dystrophic with mycotic and painful palpation pulses remain palpable without open lesions or wounds.  Assessment: Pain in limb secondary to onychomycosis.  Plan: Debridement of toenails bilaterally.

## 2015-12-28 NOTE — Progress Notes (Signed)
This encounter was created in error - please disregard.

## 2016-03-18 ENCOUNTER — Ambulatory Visit: Payer: Medicare Other | Admitting: Podiatry

## 2016-03-26 DIAGNOSIS — I071 Rheumatic tricuspid insufficiency: Secondary | ICD-10-CM | POA: Insufficient documentation

## 2016-03-27 ENCOUNTER — Ambulatory Visit: Payer: Medicare Other | Admitting: Podiatry

## 2016-05-20 ENCOUNTER — Ambulatory Visit (INDEPENDENT_AMBULATORY_CARE_PROVIDER_SITE_OTHER): Payer: Medicare Other | Admitting: Podiatry

## 2016-05-20 ENCOUNTER — Encounter: Payer: Self-pay | Admitting: Podiatry

## 2016-05-20 DIAGNOSIS — M79676 Pain in unspecified toe(s): Secondary | ICD-10-CM

## 2016-05-20 DIAGNOSIS — B351 Tinea unguium: Secondary | ICD-10-CM | POA: Diagnosis not present

## 2016-05-20 NOTE — Progress Notes (Signed)
He presents today chief complaint of painful elongated toenails 1 through 5 bilateral.  Objective: His toenails are thick yellow dystrophic with mycotic painful palpation.  Assessment: Pain limb secondary to onychomycosis.  Plan: Debridement of toenails 1 through 5 bilateral.

## 2016-07-17 ENCOUNTER — Ambulatory Visit: Payer: Medicare Other

## 2016-07-23 ENCOUNTER — Ambulatory Visit
Admission: RE | Admit: 2016-07-23 | Discharge: 2016-07-23 | Disposition: A | Discharge status: Home / Self Care | Payer: Medicare Other | Source: Ambulatory Visit | Attending: Urology | Admitting: Urology

## 2016-07-23 DIAGNOSIS — K7689 Other specified diseases of liver: Secondary | ICD-10-CM | POA: Diagnosis not present

## 2016-07-23 DIAGNOSIS — Z85528 Personal history of other malignant neoplasm of kidney: Secondary | ICD-10-CM | POA: Diagnosis present

## 2016-07-30 ENCOUNTER — Encounter: Payer: Self-pay | Admitting: Urology

## 2016-07-30 ENCOUNTER — Encounter: Payer: Self-pay | Admitting: Podiatry

## 2016-07-30 ENCOUNTER — Ambulatory Visit (INDEPENDENT_AMBULATORY_CARE_PROVIDER_SITE_OTHER): Payer: Medicare Other | Admitting: Urology

## 2016-07-30 ENCOUNTER — Ambulatory Visit (INDEPENDENT_AMBULATORY_CARE_PROVIDER_SITE_OTHER): Payer: Medicare Other | Admitting: Podiatry

## 2016-07-30 VITALS — BP 131/71 | HR 76 | Ht 73.0 in | Wt 209.0 lb

## 2016-07-30 DIAGNOSIS — N35919 Unspecified urethral stricture, male, unspecified site: Secondary | ICD-10-CM | POA: Insufficient documentation

## 2016-07-30 DIAGNOSIS — N99114 Postprocedural urethral stricture, male, unspecified: Secondary | ICD-10-CM

## 2016-07-30 DIAGNOSIS — M79609 Pain in unspecified limb: Secondary | ICD-10-CM

## 2016-07-30 DIAGNOSIS — N401 Enlarged prostate with lower urinary tract symptoms: Secondary | ICD-10-CM | POA: Diagnosis not present

## 2016-07-30 DIAGNOSIS — B351 Tinea unguium: Secondary | ICD-10-CM

## 2016-07-30 DIAGNOSIS — C801 Malignant (primary) neoplasm, unspecified: Secondary | ICD-10-CM | POA: Diagnosis not present

## 2016-07-30 DIAGNOSIS — R350 Frequency of micturition: Secondary | ICD-10-CM | POA: Diagnosis not present

## 2016-07-30 DIAGNOSIS — L603 Nail dystrophy: Secondary | ICD-10-CM

## 2016-07-30 DIAGNOSIS — L608 Other nail disorders: Secondary | ICD-10-CM

## 2016-07-30 LAB — BLADDER SCAN AMB NON-IMAGING

## 2016-07-30 NOTE — Progress Notes (Signed)
07/30/2016 7:17 AM   Kyle Gutierrez 05/27/1932 [id number]  Referring provider: Derinda Late, MD [phone] [address] [facility] [place], [place] [address]   CC: F/U history of left renal cell carcinoma, BPH, urethral stricture  HPI:   1. Benign prostatic hyperplasia with urinary frequency - s/p TURP many years ago. Now minimal bother.   2. Renal cell carcinoma - s/p LEFT laparoscopic partial nephrectomy around 2002 per report. CT 2016 and [place] 2017 without recurrence or new worrisome lesions. He has fulfilled all NCCN surveillance.   3. Postprocedural male urethral stricture - s/p OR dilation 2014 of stricture, bedside dilation 2016.   Today "Kyle Gutierrez" is seen in f/u above after renal US and PVR. No interval reteniton. PVR today 50m (normal)  PMH: Past Medical History:  Diagnosis Date  . Benign prostatic hypertrophy    s/p TURP  . Carotid artery plaque    bilateral  . Chronic atrial fibrillation ([facility]   . Coronary artery disease, non-occlusive   . Gross hematuria   . Hypertension   . Neuropathy, alcoholic (HCC)    per Neurologic eval, remote  . Nocturia   . renal cell carcinoma 2002   left kidney cancer with partial nephrectomy.  .Marland KitchenRetinopathy    followed by [facility]  . Substance abuse    alcohol  . Thrombocytopenia, unspecified ([facility]   . Urethral stricture   . Urinary frequency   . Valvular cardiomyopathy (HCC)    severe mitral and tricuspid insufficiency, ECHO July 2012    Surgical History: Past Surgical History:  Procedure Laterality Date  . CYSTOSCOPY W/ RETROGRADES N/A 05/06/2013   Procedure: CYSTOSCOPY WITH RETROGRADE PYELOGRAM,  BALLOON DILATION OF URETHRAL STRICTURE;  Surgeon: LDutch Gray MD;  Location: [facility];  Service: Urology;  Laterality: N/A;  . EYE SURGERY     bilateral cataracts removed  . PARTIAL NEPHRECTOMY Left    10'02  . PROSTATE SURGERY    . TRANSURETHRAL RESECTION OF PROSTATE  2003    Home  Medications:    Medication List       Accurate as of 07/30/16  7:17 AM. Always use your most recent med list.          Cyanocobalamin 1000 MCG Subl Place 1 tablet (1,000 mcg total) under the tongue daily.   losartan-hydrochlorothiazide 100-12.5 MG tablet Commonly known as:  HYZAAR Take 0.5 tablets by mouth daily.   metoprolol succinate 25 MG 24 hr tablet Commonly known as:  TOPROL-XL Take by mouth every morning. Takes 1/2 tablet   simvastatin 40 MG tablet Commonly known as:  ZOCOR Take 1 tablet (40 mg total) by mouth every morning.   warfarin 4 MG tablet Commonly known as:  COUMADIN Take 4 mg by mouth every morning.       Allergies:  Allergies  Allergen Reactions  . Adhesive [Tape] Rash  . Latex Rash  . Tapentadol Rash    Family History: Family History  Problem Relation Age of Onset  . Cancer Sister     Breast  . Heart disease Mother 633   heart failure  . Early death Father 324 . Heart disease Father     CAD    Social History:  reports that he quit smoking about 39 years ago. He has never used smokeless tobacco. He reports that he drinks alcohol. He reports that he does not use drugs.   Review of Systems  Gastrointestinal (upper)  : Negative for upper GI  symptoms  Gastrointestinal (lower) : Negative for lower GI symptoms  Constitutional : Negative for symptoms  Skin: Negative for skin symptoms  Eyes: Negative for eye symptoms  Ear/Nose/Throat : Negative for Ear/Nose/Throat symptoms  Hematologic/Lymphatic: Negative for Hematologic/Lymphatic symptoms  Cardiovascular : Negative for cardiovascular symptoms  Respiratory : Negative for respiratory symptoms  Endocrine: Negative for endocrine symptoms  Musculoskeletal: Negative for musculoskeletal symptoms  Neurological: Negative for neurological symptoms  Psychologic: Negative for psychiatric symptoms    Physical Exam: There were no vitals taken for this visit.    Constitutional:  Alert and oriented, No acute distress. Very vigorous for age.  HEENT: [place] AT, moist mucus membranes.  Trachea midline, no masses. Cardiovascular: No clubbing, cyanosis, or edema. Respiratory: Normal respiratory effort, no increased work of breathing. GI: Abdomen is soft, nontender, nondistended, no abdominal masses. Prior surgical scars w/o hernias noted.  GU: No CVA tenderness. Phallus striaght. No testes masses.  Skin: No rashes, bruises or suspicious lesions. Lymph: No cervical or inguinal adenopathy. Neurologic: Grossly intact, no focal deficits, moving all 4 etremities. Psychiatric: Normal mood and affect.  Laboratory Data: Lab Results  Component Value Date   WBC 6.1 05/02/2014   HGB 14.5 05/02/2014   HCT 44.0 05/02/2014   MCV 92.8 05/02/2014   PLT 113.0 (L) 05/02/2014    Lab Results  Component Value Date   CREATININE 1.2 05/02/2014    Lab Results  Component Value Date   PSA 1.0 02/13/2012   PSA 0.82 02/28/2009    No results found for: TESTOSTERONE  Lab Results  Component Value Date   HGBA1C 5.4 01/05/2009    Urinalysis    Component Value Date/Time   COLORURINE Yellow 03/19/2014 1032   COLORURINE LT. YELLOW 08/11/2012 1000   APPEARANCEUR Clear 03/19/2014 1032   APPEARANCEUR Clear 02/13/2012 0833   LABSPEC 1.009 03/19/2014 1032   PHURINE 7.0 03/19/2014 1032   PHURINE 7.0 08/11/2012 1000   GLUCOSEU Negative 03/19/2014 1032   GLUCOSEU NEGATIVE 08/11/2012 1000   HGBUR Negative 03/19/2014 1032   HGBUR TRACE-INTACT 08/11/2012 1000   BILIRUBINUR Negative 03/19/2014 1032   KETONESUR Trace 03/19/2014 1032   KETONESUR NEGATIVE 08/11/2012 1000   PROTEINUR Negative 03/19/2014 1032   UROBILINOGEN 0.2 08/11/2012 1000   NITRITE Negative 03/19/2014 1032   NITRITE NEGATIVE 08/11/2012 1000   LEUKOCYTESUR Negative 03/19/2014 1032    Pertinent Imaging: as per HPI  Assessment & Plan:    1. Benign prostatic hyperplasia with urinary frequency -  stable s/p prior TURP, observe.   2. renal cell carcinoma - NED 15 years after primary therapy, he has fulfilled all NCCN surveillance criteria. No furhter imaging indicated.   3. Postprocedural male urethral stricture - he empties well, low clinical suspicion for recurrence. Discussed need for repeat eval for progressive obstructive symptoms. Given his recurrence pattern he opts for continued annual surveillance of this.  RTC 1 year with PVR.    No Follow-up on file.  Alexis Frock, [facility] [address] [place], [place] [address] [phone]

## 2016-07-30 NOTE — Addendum Note (Signed)
Addended by: Tommy Rainwater on: 07/30/2016 11:32 AM   Modules accepted: Orders

## 2016-10-14 ENCOUNTER — Ambulatory Visit (INDEPENDENT_AMBULATORY_CARE_PROVIDER_SITE_OTHER): Payer: Medicare Other | Admitting: Podiatry

## 2016-10-14 ENCOUNTER — Encounter: Payer: Self-pay | Admitting: Podiatry

## 2016-10-14 DIAGNOSIS — B351 Tinea unguium: Secondary | ICD-10-CM | POA: Diagnosis not present

## 2016-10-14 DIAGNOSIS — M79609 Pain in unspecified limb: Secondary | ICD-10-CM

## 2016-10-14 NOTE — Progress Notes (Signed)
He presents today to complaint of painful elongated toenails.  Objective: Pulses are palpable. Toenails are long thick yellow dystrophic with mycotic painful palpation.  Assessment: Pain and limited secondary onychomycosis.  Plan: Debridement of toenails 1 through 5 bilateral.

## 2016-12-25 ENCOUNTER — Ambulatory Visit: Payer: Medicare Other | Admitting: Podiatry

## 2016-12-30 ENCOUNTER — Encounter: Payer: Self-pay | Admitting: Podiatry

## 2016-12-30 ENCOUNTER — Ambulatory Visit (INDEPENDENT_AMBULATORY_CARE_PROVIDER_SITE_OTHER): Payer: Medicare Other | Admitting: Podiatry

## 2016-12-30 DIAGNOSIS — B351 Tinea unguium: Secondary | ICD-10-CM | POA: Diagnosis not present

## 2016-12-30 DIAGNOSIS — M79609 Pain in unspecified limb: Secondary | ICD-10-CM | POA: Diagnosis not present

## 2016-12-30 NOTE — Progress Notes (Signed)
He presents today she complained painful elongated toenails.  Objective: Tenderness along the patellar dystrophic with mycotic pulses are palpable. His history of neuropathy.  Assessment: Pain limb secondary to onychomycosis and neuropathy.  Plan: Debridement of toenails 1 through 5 bilateral.

## 2017-01-22 DIAGNOSIS — E538 Deficiency of other specified B group vitamins: Secondary | ICD-10-CM | POA: Insufficient documentation

## 2017-03-31 ENCOUNTER — Ambulatory Visit (INDEPENDENT_AMBULATORY_CARE_PROVIDER_SITE_OTHER): Payer: Medicare Other | Admitting: Podiatry

## 2017-03-31 ENCOUNTER — Encounter: Payer: Self-pay | Admitting: Podiatry

## 2017-03-31 DIAGNOSIS — M79609 Pain in unspecified limb: Secondary | ICD-10-CM | POA: Diagnosis not present

## 2017-03-31 DIAGNOSIS — B351 Tinea unguium: Secondary | ICD-10-CM

## 2017-03-31 NOTE — Progress Notes (Signed)
He presents today with a chief concern of painful elongated toenails.  Objective: Pulses remain palpable bilateral. No open lesions or wounds are noted. Toenails are long thick yellow dystrophic with mycotic painful palpation as well as debridement.  Assessment: Pain in limb secondary to onychomycosis.  Plan: Debridement of toenails bilateral.

## 2017-05-28 NOTE — Telephone Encounter (Signed)
Error

## 2017-05-29 NOTE — Telephone Encounter (Signed)
Error

## 2017-06-04 ENCOUNTER — Encounter
Admission: RE | Admit: 2017-06-04 | Discharge: 2017-06-04 | Disposition: A | Discharge status: Home / Self Care | Payer: Medicare Other | Source: Ambulatory Visit | Attending: Orthopedic Surgery | Admitting: Orthopedic Surgery

## 2017-06-04 DIAGNOSIS — M1611 Unilateral primary osteoarthritis, right hip: Secondary | ICD-10-CM | POA: Insufficient documentation

## 2017-06-04 DIAGNOSIS — Z7901 Long term (current) use of anticoagulants: Secondary | ICD-10-CM | POA: Insufficient documentation

## 2017-06-04 DIAGNOSIS — I10 Essential (primary) hypertension: Secondary | ICD-10-CM | POA: Insufficient documentation

## 2017-06-04 DIAGNOSIS — I6523 Occlusion and stenosis of bilateral carotid arteries: Secondary | ICD-10-CM | POA: Insufficient documentation

## 2017-06-04 DIAGNOSIS — Z01818 Encounter for other preprocedural examination: Secondary | ICD-10-CM | POA: Insufficient documentation

## 2017-06-04 DIAGNOSIS — Z79899 Other long term (current) drug therapy: Secondary | ICD-10-CM | POA: Diagnosis not present

## 2017-06-04 DIAGNOSIS — I2510 Atherosclerotic heart disease of native coronary artery without angina pectoris: Secondary | ICD-10-CM | POA: Insufficient documentation

## 2017-06-04 HISTORY — DX: Sleep apnea, unspecified: G47.30

## 2017-06-04 LAB — BASIC METABOLIC PANEL
Anion gap: 8 (ref 5–15)
BUN: 25 mg/dL — ABNORMAL HIGH (ref 6–20)
CO2: 29 mmol/L (ref 22–32)
CREATININE: 1.19 mg/dL (ref 0.61–1.24)
Calcium: 9.4 mg/dL (ref 8.9–10.3)
Chloride: 103 mmol/L (ref 101–111)
GFR, EST NON AFRICAN AMERICAN: 55 mL/min — AB (ref >60)
Glucose, Bld: 94 mg/dL (ref 65–99)
Potassium: 4.1 mmol/L (ref 3.5–5.1)
SODIUM: 140 mmol/L (ref 135–145)

## 2017-06-04 LAB — CBC
HCT: 39.8 % — ABNORMAL LOW (ref 40.0–52.0)
Hemoglobin: 13.6 g/dL (ref 13.0–18.0)
MCH: 31.4 pg (ref 26.0–34.0)
MCHC: 34.2 g/dL (ref 32.0–36.0)
MCV: 92 fL (ref 80.0–100.0)
PLATELETS: 177 10*3/uL (ref 150–440)
RBC: 4.33 MIL/uL — ABNORMAL LOW (ref 4.40–5.90)
RDW: 13.9 % (ref 11.5–14.5)
WBC: 6.1 10*3/uL (ref 3.8–10.6)

## 2017-06-04 LAB — PROTIME-INR
INR: 1.93
Prothrombin Time: 21.9 seconds — ABNORMAL HIGH (ref 11.4–15.2)

## 2017-06-04 LAB — URINALYSIS, ROUTINE W REFLEX MICROSCOPIC
Bilirubin Urine: NEGATIVE
GLUCOSE, UA: NEGATIVE mg/dL
Hgb urine dipstick: NEGATIVE
Ketones, ur: NEGATIVE mg/dL
LEUKOCYTES UA: NEGATIVE
Nitrite: NEGATIVE
Protein, ur: NEGATIVE mg/dL
Specific Gravity, Urine: 1.015 (ref 1.005–1.030)
pH: 6 (ref 5.0–8.0)

## 2017-06-04 LAB — SURGICAL PCR SCREEN
MRSA, PCR: NEGATIVE
Staphylococcus aureus: NEGATIVE

## 2017-06-04 LAB — TYPE AND SCREEN
ABO/RH(D): O NEG
Antibody Screen: NEGATIVE

## 2017-06-04 LAB — APTT: aPTT: 38 seconds — ABNORMAL HIGH (ref 24–36)

## 2017-06-04 NOTE — Patient Instructions (Signed)
Your procedure is scheduled on: Thursday Oct. 18, 2018. Report to Same Day Surgery To find out your arrival time please call [phone] between 1PM - 3PM on Wednesday Oct. 17, 2018.  Remember: Instructions that are not followed completely may result in serious medical risk, up to and including death, or upon the discretion of your surgeon and anesthesiologist your surgery may need to be rescheduled.     _X__ 1. Do not eat food after midnight the night before your procedure.                 No gum chewing or hard candies. You may drink clear liquids up to 2 hours                 before you are scheduled to arrive for your surgery- DO not drink clear                 liquids within 2 hours of the start of your surgery.                 Clear Liquids include:  water, apple juice without pulp, clear carbohydrate                 drink such as Clearfast of Gartorade, Black Coffee or Tea (Do not add                 anything to coffee or tea).     _X__ 2.  No Alcohol for 24 hours before or after surgery.   ___ 3.  Do Not Smoke or use e-cigarettes For 24 Hours Prior to Your Surgery.                 Do not use any chewable tobacco products for at least 6 hours prior to                 surgery.  ____  4.  Bring all medications with you on the day of surgery if instructed.   __x__  5.  Notify your doctor if there is any change in your medical condition      (cold, fever, infections).     Do not wear jewelry, make-up, hairpins, clips or nail polish. Do not wear lotions, powders, or perfumes. You may wear deodorant. Do not shave 48 hours prior to surgery. Men may shave face and neck. Do not bring valuables to the hospital.    [facility] is not responsible for any belongings or valuables.  Contacts, dentures or bridgework may not be worn into surgery. Leave your suitcase in the car. After surgery it may be brought to your room. For patients admitted to the hospital,  discharge time is determined by your treatment team.   Patients discharged the day of surgery will not be allowed to drive home.   Please read over the following fact sheets that you were given:   Preparing for Surgery          _x___ Take these medicines the morning of surgery with A SIP OF WATER:    1. metoprolol succinate (TOPROL-XL)  2. simvastatin (ZOCOR)   3. traMADol (ULTRAM) optional   ____ Fleet Enema (as directed)   _x___ Use CHG Soap as directed  ____ Use inhalers on the day of surgery  ____ Stop metformin 2 days prior to surgery    ____ Take 1/2 of usual insulin dose the night before surgery. No insulin the morning  of surgery.   ____ Stop Coumadin 5 days prior to surgery per Dr. Nehemiah Massed.  ____ Stop Anti-inflammatories, does not apply.   ____ Stop supplements until after surgery.    _x___ Bring C-Pap to the hospital.

## 2017-06-04 NOTE — Pre-Procedure Instructions (Addendum)
  Progress Notes - in this encounter  Flossie Dibble, MD - 05/28/2017 10:30 AM EDT Formatting of this note may be different from the original. Established Patient Visit     Plan  -Proceed to surgery and/or invasive procedure without restriction. The patient is at the lowest risk possible for cardiovascular complication as stated above. Patient may discontinue anticoagulation 3 days prior to surgical intervention and or procedure for reduced bleeding risk. The patient shall restart at a safe period after procedure when bleeding risk is reduced. Patient understands all the risks and benefits of anticoagulation and discontinuation of anticoagulation perioperatively and the quoted study data that applies  -No change in medication management or treatment mitral valve insufficiency. The patient will continue to watch the new symptoms suggestive of progression of mitral valve disease. -No change in current and appropriate medication management of atrial fibrillation and coexisting risk factors. The patient will continue to watch closely for any significant new symptoms or signs of side effects of medications and\or for heart rate control of atrial fibrillation. -There has been a discussion of the current guidelines for hypertension control. We will continue current medical regimen for hypertension control which will also help in risk factor modification of cardiovascular disease. The patient understands and agrees with the current plan. We will be watching for possible future side effects of these medications. Additional home blood pressure monitoring is recommended if able.  Orders Placed This Encounter  Procedures  . ECG 12-lead   Return in about 6 months (around 11/25/2017).  Flossie Dibble, MD      Risk of surgery and/or procedure low Possible need and adjustments in therapy prior to surgery and/or procedure no Overall risk of cardiac complication with surgery and/or procedure is  low, <1%

## 2017-06-05 NOTE — Pre-Procedure Instructions (Signed)
LABS FAXED TO DR [facility]. PT/INR FAXED

## 2017-06-06 LAB — LATEX, IGE: Latex: <0.1 kU/L

## 2017-06-06 LAB — URINE CULTURE: Culture: NO GROWTH

## 2017-06-18 MED ORDER — CEFAZOLIN SODIUM-DEXTROSE 2-4 GM/100ML-% IV SOLN
2.0000 g | Freq: Once | INTRAVENOUS | Status: DC
  Filled 2017-06-18: qty 100

## 2017-06-19 ENCOUNTER — Inpatient Hospital Stay: Payer: Medicare Other | Admitting: Anesthesiology

## 2017-06-19 ENCOUNTER — Encounter: Payer: Self-pay | Admitting: *Deleted

## 2017-06-19 ENCOUNTER — Inpatient Hospital Stay: Payer: Medicare Other

## 2017-06-19 ENCOUNTER — Inpatient Hospital Stay
Admission: RE | Admit: 2017-06-19 | Discharge: 2017-06-22 | DRG: 470 | Disposition: A | Discharge status: Skilled Nursing Facility | Payer: Medicare Other | Source: Ambulatory Visit | Attending: Orthopedic Surgery | Admitting: Orthopedic Surgery

## 2017-06-19 ENCOUNTER — Encounter
Admission: RE | Disposition: A | Discharge status: Skilled Nursing Facility | Payer: Self-pay | Source: Ambulatory Visit | Attending: Orthopedic Surgery

## 2017-06-19 DIAGNOSIS — Z85528 Personal history of other malignant neoplasm of kidney: Secondary | ICD-10-CM | POA: Diagnosis not present

## 2017-06-19 DIAGNOSIS — Z91048 Other nonmedicinal substance allergy status: Secondary | ICD-10-CM | POA: Diagnosis not present

## 2017-06-19 DIAGNOSIS — Z91040 Latex allergy status: Secondary | ICD-10-CM

## 2017-06-19 DIAGNOSIS — E785 Hyperlipidemia, unspecified: Secondary | ICD-10-CM | POA: Diagnosis present

## 2017-06-19 DIAGNOSIS — Z7901 Long term (current) use of anticoagulants: Secondary | ICD-10-CM

## 2017-06-19 DIAGNOSIS — M1611 Unilateral primary osteoarthritis, right hip: Secondary | ICD-10-CM | POA: Diagnosis present

## 2017-06-19 DIAGNOSIS — G8918 Other acute postprocedural pain: Secondary | ICD-10-CM

## 2017-06-19 DIAGNOSIS — I428 Other cardiomyopathies: Secondary | ICD-10-CM | POA: Diagnosis present

## 2017-06-19 DIAGNOSIS — Z905 Acquired absence of kidney: Secondary | ICD-10-CM | POA: Diagnosis not present

## 2017-06-19 DIAGNOSIS — I482 Chronic atrial fibrillation: Secondary | ICD-10-CM | POA: Diagnosis present

## 2017-06-19 DIAGNOSIS — Z885 Allergy status to narcotic agent status: Secondary | ICD-10-CM

## 2017-06-19 DIAGNOSIS — I081 Rheumatic disorders of both mitral and tricuspid valves: Secondary | ICD-10-CM | POA: Diagnosis present

## 2017-06-19 DIAGNOSIS — G621 Alcoholic polyneuropathy: Secondary | ICD-10-CM | POA: Diagnosis present

## 2017-06-19 DIAGNOSIS — Z79899 Other long term (current) drug therapy: Secondary | ICD-10-CM | POA: Diagnosis not present

## 2017-06-19 DIAGNOSIS — G4730 Sleep apnea, unspecified: Secondary | ICD-10-CM | POA: Diagnosis present

## 2017-06-19 DIAGNOSIS — Z87891 Personal history of nicotine dependence: Secondary | ICD-10-CM | POA: Diagnosis not present

## 2017-06-19 DIAGNOSIS — I10 Essential (primary) hypertension: Secondary | ICD-10-CM | POA: Diagnosis present

## 2017-06-19 DIAGNOSIS — Z419 Encounter for procedure for purposes other than remedying health state, unspecified: Secondary | ICD-10-CM

## 2017-06-19 DIAGNOSIS — I2510 Atherosclerotic heart disease of native coronary artery without angina pectoris: Secondary | ICD-10-CM | POA: Diagnosis present

## 2017-06-19 HISTORY — PX: TOTAL HIP ARTHROPLASTY: SHX124

## 2017-06-19 LAB — CBC
HCT: 39.3 % — ABNORMAL LOW (ref 40.0–52.0)
Hemoglobin: 12.9 g/dL — ABNORMAL LOW (ref 13.0–18.0)
MCH: 30.5 pg (ref 26.0–34.0)
MCHC: 32.9 g/dL (ref 32.0–36.0)
MCV: 92.6 fL (ref 80.0–100.0)
Platelets: 130 10*3/uL — ABNORMAL LOW (ref 150–440)
RBC: 4.24 MIL/uL — ABNORMAL LOW (ref 4.40–5.90)
RDW: 13.6 % (ref 11.5–14.5)
WBC: 7.3 10*3/uL (ref 3.8–10.6)

## 2017-06-19 LAB — TYPE AND SCREEN
ABO/RH(D): O NEG
ANTIBODY SCREEN: NEGATIVE

## 2017-06-19 LAB — PROTIME-INR
INR: 1.11
PROTHROMBIN TIME: 14.2 s (ref 11.4–15.2)

## 2017-06-19 LAB — CREATININE, SERUM
Creatinine, Ser: 1.13 mg/dL (ref 0.61–1.24)
GFR calc Af Amer: >60 mL/min (ref >60)
GFR, EST NON AFRICAN AMERICAN: 58 mL/min — AB (ref >60)

## 2017-06-19 SURGERY — ARTHROPLASTY, HIP, TOTAL, ANTERIOR APPROACH
Anesthesia: Spinal | Site: Hip | Laterality: Right | Wound class: Clean

## 2017-06-19 MED ORDER — MAGNESIUM CITRATE PO SOLN
1.0000 | Freq: Once | ORAL | Status: DC | PRN
  Filled 2017-06-19: qty 296

## 2017-06-19 MED ORDER — WARFARIN SODIUM 4 MG PO TABS: 4.0000 mg | ORAL_TABLET | ORAL | Status: DC

## 2017-06-19 MED ORDER — SIMVASTATIN 20 MG PO TABS
40.0000 mg | ORAL_TABLET | Freq: Every morning | ORAL | Status: DC
  Administered 2017-06-20 – 2017-06-22 (×3): 40 mg via ORAL
  Filled 2017-06-19 (×3): qty 2

## 2017-06-19 MED ORDER — ONDANSETRON HCL 4 MG/2ML IJ SOLN: 4.0000 mg | Freq: Once | INTRAMUSCULAR | Status: DC | PRN

## 2017-06-19 MED ORDER — SODIUM CHLORIDE 0.9 % IV SOLN
INTRAVENOUS | Status: DC
  Administered 2017-06-19 (×2): via INTRAVENOUS

## 2017-06-19 MED ORDER — HYDROCHLOROTHIAZIDE 12.5 MG PO CAPS
12.5000 mg | ORAL_CAPSULE | Freq: Every day | ORAL | Status: DC
  Administered 2017-06-20 – 2017-06-22 (×3): 12.5 mg via ORAL
  Filled 2017-06-19 (×2): qty 1

## 2017-06-19 MED ORDER — PROPOFOL 500 MG/50ML IV EMUL
INTRAVENOUS | Status: AC
  Filled 2017-06-19: qty 50

## 2017-06-19 MED ORDER — WARFARIN SODIUM 2 MG PO TABS
2.0000 mg | ORAL_TABLET | ORAL | Status: DC
  Administered 2017-06-21: 2 mg via ORAL
  Filled 2017-06-19 (×2): qty 1

## 2017-06-19 MED ORDER — ENOXAPARIN SODIUM 40 MG/0.4ML ~~LOC~~ SOLN
40.0000 mg | SUBCUTANEOUS | Status: DC
  Administered 2017-06-20 – 2017-06-22 (×3): 40 mg via SUBCUTANEOUS
  Filled 2017-06-19 (×3): qty 0.4

## 2017-06-19 MED ORDER — PHENOL 1.4 % MT LIQD
1.0000 | OROMUCOSAL | Status: DC | PRN
  Filled 2017-06-19: qty 177

## 2017-06-19 MED ORDER — ALUM & MAG HYDROXIDE-SIMETH 200-200-20 MG/5ML PO SUSP: 30.0000 mL | ORAL | Status: DC | PRN

## 2017-06-19 MED ORDER — HYDROMORPHONE HCL 1 MG/ML IJ SOLN
0.5000 mg | Freq: Once | INTRAMUSCULAR | Status: AC
  Administered 2017-06-19: 0.5 mg via INTRAVENOUS
  Filled 2017-06-19: qty 1

## 2017-06-19 MED ORDER — BUPIVACAINE-EPINEPHRINE (PF) 0.25% -1:200000 IJ SOLN
INTRAMUSCULAR | Status: AC
  Filled 2017-06-19: qty 30

## 2017-06-19 MED ORDER — ZOLPIDEM TARTRATE 5 MG PO TABS
5.0000 mg | ORAL_TABLET | Freq: Every evening | ORAL | Status: DC | PRN
  Administered 2017-06-19: 5 mg via ORAL
  Filled 2017-06-19: qty 1

## 2017-06-19 MED ORDER — CEFAZOLIN SODIUM-DEXTROSE 2-4 GM/100ML-% IV SOLN
2.0000 g | Freq: Four times a day (QID) | INTRAVENOUS | Status: DC
  Filled 2017-06-19 (×3): qty 100

## 2017-06-19 MED ORDER — CEFAZOLIN SODIUM-DEXTROSE 2-4 GM/100ML-% IV SOLN
INTRAVENOUS | Status: AC
  Filled 2017-06-19: qty 100

## 2017-06-19 MED ORDER — LOSARTAN POTASSIUM 50 MG PO TABS
100.0000 mg | ORAL_TABLET | Freq: Every day | ORAL | Status: DC
  Administered 2017-06-20 – 2017-06-22 (×3): 100 mg via ORAL
  Filled 2017-06-19 (×3): qty 2

## 2017-06-19 MED ORDER — LOSARTAN POTASSIUM-HCTZ 100-12.5 MG PO TABS: 1.0000 | ORAL_TABLET | Freq: Every day | ORAL | Status: DC

## 2017-06-19 MED ORDER — BUPIVACAINE HCL (PF) 0.5 % IJ SOLN
INTRAMUSCULAR | Status: DC | PRN
  Administered 2017-06-19: 3 mL

## 2017-06-19 MED ORDER — DOCUSATE SODIUM 100 MG PO CAPS
100.0000 mg | ORAL_CAPSULE | Freq: Two times a day (BID) | ORAL | Status: DC
  Administered 2017-06-19 – 2017-06-22 (×6): 100 mg via ORAL
  Filled 2017-06-19 (×6): qty 1

## 2017-06-19 MED ORDER — TRAMADOL HCL 50 MG PO TABS
50.0000 mg | ORAL_TABLET | Freq: Three times a day (TID) | ORAL | Status: DC
  Administered 2017-06-19 (×2): 50 mg via ORAL
  Filled 2017-06-19 (×2): qty 1

## 2017-06-19 MED ORDER — WARFARIN SODIUM 2 MG PO TABS: 2.0000 mg | ORAL_TABLET | ORAL | Status: DC

## 2017-06-19 MED ORDER — MENTHOL 3 MG MT LOZG
1.0000 | LOZENGE | OROMUCOSAL | Status: DC | PRN
  Filled 2017-06-19: qty 9

## 2017-06-19 MED ORDER — FENTANYL CITRATE (PF) 100 MCG/2ML IJ SOLN: 25.0000 ug | INTRAMUSCULAR | Status: DC | PRN

## 2017-06-19 MED ORDER — ACETAMINOPHEN 325 MG PO TABS: 650.0000 mg | ORAL_TABLET | Freq: Four times a day (QID) | ORAL | Status: DC | PRN

## 2017-06-19 MED ORDER — METOPROLOL SUCCINATE ER 25 MG PO TB24
12.5000 mg | ORAL_TABLET | Freq: Every day | ORAL | Status: DC
  Administered 2017-06-20 – 2017-06-22 (×3): 12.5 mg via ORAL
  Filled 2017-06-19 (×3): qty 1

## 2017-06-19 MED ORDER — METHOCARBAMOL 500 MG PO TABS
500.0000 mg | ORAL_TABLET | Freq: Four times a day (QID) | ORAL | Status: DC | PRN
  Administered 2017-06-19 – 2017-06-20 (×4): 500 mg via ORAL
  Filled 2017-06-19 (×4): qty 1

## 2017-06-19 MED ORDER — METHOCARBAMOL 1000 MG/10ML IJ SOLN
500.0000 mg | Freq: Four times a day (QID) | INTRAMUSCULAR | Status: DC | PRN
  Filled 2017-06-19: qty 5

## 2017-06-19 MED ORDER — ENOXAPARIN SODIUM 40 MG/0.4ML ~~LOC~~ SOLN: 40.0000 mg | SUBCUTANEOUS | Status: DC

## 2017-06-19 MED ORDER — MAGNESIUM HYDROXIDE 400 MG/5ML PO SUSP
30.0000 mL | Freq: Every day | ORAL | Status: DC | PRN
  Administered 2017-06-21: 30 mL via ORAL
  Filled 2017-06-19: qty 30

## 2017-06-19 MED ORDER — MIDAZOLAM HCL 2 MG/2ML IJ SOLN
INTRAMUSCULAR | Status: AC
  Filled 2017-06-19: qty 2

## 2017-06-19 MED ORDER — DIPHENHYDRAMINE HCL 12.5 MG/5ML PO ELIX: 12.5000 mg | ORAL_SOLUTION | ORAL | Status: DC | PRN

## 2017-06-19 MED ORDER — MORPHINE SULFATE (PF) 2 MG/ML IV SOLN
2.0000 mg | INTRAVENOUS | Status: DC | PRN
  Administered 2017-06-19 – 2017-06-20 (×3): 2 mg via INTRAVENOUS
  Filled 2017-06-19 (×3): qty 1

## 2017-06-19 MED ORDER — METOCLOPRAMIDE HCL 5 MG/ML IJ SOLN: 5.0000 mg | Freq: Three times a day (TID) | INTRAMUSCULAR | Status: DC | PRN

## 2017-06-19 MED ORDER — LACTATED RINGERS IV SOLN
INTRAVENOUS | Status: DC
Start: 2017-06-19 — End: 2017-06-19
  Administered 2017-06-19 (×2): via INTRAVENOUS

## 2017-06-19 MED ORDER — MIDAZOLAM HCL 5 MG/5ML IJ SOLN
INTRAMUSCULAR | Status: DC | PRN
  Administered 2017-06-19: 2 mg via INTRAVENOUS

## 2017-06-19 MED ORDER — METOCLOPRAMIDE HCL 10 MG PO TABS: 5.0000 mg | ORAL_TABLET | Freq: Three times a day (TID) | ORAL | Status: DC | PRN

## 2017-06-19 MED ORDER — CEFAZOLIN SODIUM-DEXTROSE 2-3 GM-%(50ML) IV SOLR
INTRAVENOUS | Status: DC | PRN
  Administered 2017-06-19: 2 g via INTRAVENOUS

## 2017-06-19 MED ORDER — PROPOFOL 10 MG/ML IV BOLUS
INTRAVENOUS | Status: AC
  Filled 2017-06-19: qty 20

## 2017-06-19 MED ORDER — ONDANSETRON HCL 4 MG/2ML IJ SOLN
4.0000 mg | Freq: Four times a day (QID) | INTRAMUSCULAR | Status: DC | PRN
  Administered 2017-06-19: 4 mg via INTRAVENOUS
  Filled 2017-06-19: qty 2

## 2017-06-19 MED ORDER — ACETAMINOPHEN 650 MG RE SUPP: 650.0000 mg | Freq: Four times a day (QID) | RECTAL | Status: DC | PRN

## 2017-06-19 MED ORDER — DEXTROSE 5 % IV SOLN
2.0000 g | Freq: Four times a day (QID) | INTRAVENOUS | Status: AC
  Administered 2017-06-19 – 2017-06-20 (×3): 2 g via INTRAVENOUS
  Filled 2017-06-19 (×3): qty 2000

## 2017-06-19 MED ORDER — PROPOFOL 500 MG/50ML IV EMUL
INTRAVENOUS | Status: DC | PRN
  Administered 2017-06-19: 75 ug/kg/min via INTRAVENOUS

## 2017-06-19 MED ORDER — WARFARIN - PHYSICIAN DOSING INPATIENT: Freq: Every day | Status: DC

## 2017-06-19 MED ORDER — WARFARIN 0.5 MG HALF TABLET: 0.5000 mg | ORAL_TABLET | ORAL | Status: DC

## 2017-06-19 MED ORDER — NEOMYCIN-POLYMYXIN B GU 40-200000 IR SOLN
Status: DC | PRN
  Administered 2017-06-19: 4 mL

## 2017-06-19 MED ORDER — ONDANSETRON HCL 4 MG PO TABS: 4.0000 mg | ORAL_TABLET | Freq: Four times a day (QID) | ORAL | Status: DC | PRN

## 2017-06-19 MED ORDER — WARFARIN SODIUM 4 MG PO TABS
4.0000 mg | ORAL_TABLET | ORAL | Status: DC
  Administered 2017-06-20: 4 mg via ORAL
  Filled 2017-06-19: qty 1

## 2017-06-19 MED ORDER — FAMOTIDINE 20 MG PO TABS
20.0000 mg | ORAL_TABLET | Freq: Once | ORAL | Status: AC
  Administered 2017-06-19: 20 mg via ORAL

## 2017-06-19 MED ORDER — PROPOFOL 10 MG/ML IV BOLUS
INTRAVENOUS | Status: DC | PRN
  Administered 2017-06-19: 40 mg via INTRAVENOUS

## 2017-06-19 MED ORDER — BISACODYL 10 MG RE SUPP
10.0000 mg | Freq: Every day | RECTAL | Status: DC | PRN
  Administered 2017-06-22: 10 mg via RECTAL
  Filled 2017-06-19: qty 1

## 2017-06-19 MED ORDER — NEOMYCIN-POLYMYXIN B GU 40-200000 IR SOLN
Status: AC
  Filled 2017-06-19: qty 4

## 2017-06-19 MED ORDER — FAMOTIDINE 20 MG PO TABS
ORAL_TABLET | ORAL | Status: AC
  Administered 2017-06-19: 20 mg via ORAL
  Filled 2017-06-19: qty 1

## 2017-06-19 MED ORDER — OXYCODONE HCL 5 MG PO TABS
5.0000 mg | ORAL_TABLET | ORAL | Status: DC | PRN
  Administered 2017-06-19: 10 mg via ORAL
  Administered 2017-06-19 (×2): 5 mg via ORAL
  Administered 2017-06-19 – 2017-06-21 (×6): 10 mg via ORAL
  Filled 2017-06-19 (×7): qty 2
  Filled 2017-06-19 (×2): qty 1
  Filled 2017-06-19: qty 2

## 2017-06-19 MED ORDER — BUPIVACAINE-EPINEPHRINE 0.25% -1:200000 IJ SOLN
INTRAMUSCULAR | Status: DC | PRN
  Administered 2017-06-19: 30 mL

## 2017-06-19 MED ORDER — SODIUM CHLORIDE 0.9 % IV SOLN
INTRAVENOUS | Status: DC | PRN
  Administered 2017-06-19: 30 ug/min via INTRAVENOUS

## 2017-06-19 SURGICAL SUPPLY — 50 items
BLADE SAW SAG 18.5X105 (BLADE) ×2 IMPLANT
BNDG COHESIVE 6X5 TAN STRL LF (GAUZE/BANDAGES/DRESSINGS) ×6 IMPLANT
CANISTER SUCT 1200ML W/VALVE (MISCELLANEOUS) ×2 IMPLANT
CAPT HIP TOTAL 3 ×2 IMPLANT
CATH TRAY METER 16FR LF (MISCELLANEOUS) ×2 IMPLANT
CHLORAPREP W/TINT 26ML (MISCELLANEOUS) ×2 IMPLANT
DRAPE C-ARM XRAY 36X54 (DRAPES) ×2 IMPLANT
DRAPE INCISE IOBAN 66X60 STRL (DRAPES) IMPLANT
DRAPE POUCH INSTRU U-SHP 10X18 (DRAPES) ×2 IMPLANT
DRAPE SHEET LG 3/4 BI-LAMINATE (DRAPES) ×6 IMPLANT
DRAPE TABLE BACK 80X90 (DRAPES) ×2 IMPLANT
DRESSING SURGICEL FIBRLLR 1X2 (HEMOSTASIS) ×2 IMPLANT
DRSG OPSITE POSTOP 4X8 (GAUZE/BANDAGES/DRESSINGS) ×4 IMPLANT
DRSG SURGICEL FIBRILLAR 1X2 (HEMOSTASIS) ×4
ELECT BLADE 6.5 EXT (BLADE) ×2 IMPLANT
ELECT REM PT RETURN 9FT ADLT (ELECTROSURGICAL) ×2
ELECTRODE REM PT RTRN 9FT ADLT (ELECTROSURGICAL) ×1 IMPLANT
EVACUATOR 1/8 PVC DRAIN (DRAIN) IMPLANT
GLOVE BIOGEL PI IND STRL 9 (GLOVE) ×1 IMPLANT
GLOVE BIOGEL PI INDICATOR 9 (GLOVE) ×1
GLOVE SURG SYN 9.0  PF PI (GLOVE) ×2
GLOVE SURG SYN 9.0 PF PI (GLOVE) ×2 IMPLANT
GOWN SRG 2XL LVL 4 RGLN SLV (GOWNS) ×1 IMPLANT
GOWN STRL NON-REIN 2XL LVL4 (GOWNS) ×1
GOWN STRL REUS W/ TWL LRG LVL3 (GOWN DISPOSABLE) ×1 IMPLANT
GOWN STRL REUS W/TWL LRG LVL3 (GOWN DISPOSABLE) ×1
HOLDER FOLEY CATH W/STRAP (MISCELLANEOUS) ×2 IMPLANT
HOOD PEEL AWAY FLYTE STAYCOOL (MISCELLANEOUS) ×2 IMPLANT
KIT PREVENA INCISION MGT 13 (CANNISTER) ×2 IMPLANT
MAT BLUE FLOOR 46X72 FLO (MISCELLANEOUS) ×2 IMPLANT
NDL SAFETY 18GX1.5 (NEEDLE) ×2 IMPLANT
NEEDLE SPNL 18GX3.5 QUINCKE PK (NEEDLE) ×2 IMPLANT
NS IRRIG 1000ML POUR BTL (IV SOLUTION) ×2 IMPLANT
PACK HIP COMPR (MISCELLANEOUS) ×2 IMPLANT
SOL PREP PVP 2OZ (MISCELLANEOUS) ×2
SOLUTION PREP PVP 2OZ (MISCELLANEOUS) ×1 IMPLANT
SPONGE DRAIN TRACH 4X4 STRL 2S (GAUZE/BANDAGES/DRESSINGS) ×2 IMPLANT
STAPLER SKIN PROX 35W (STAPLE) ×2 IMPLANT
STRAP SAFETY BODY (MISCELLANEOUS) ×2 IMPLANT
SUT DVC 2 QUILL PDO  T11 36X36 (SUTURE) ×1
SUT DVC 2 QUILL PDO T11 36X36 (SUTURE) ×1 IMPLANT
SUT SILK 0 (SUTURE) ×1
SUT SILK 0 30XBRD TIE 6 (SUTURE) ×1 IMPLANT
SUT V-LOC 90 ABS DVC 3-0 CL (SUTURE) ×2 IMPLANT
SUT VIC AB 1 CT1 36 (SUTURE) ×2 IMPLANT
SYR 20CC LL (SYRINGE) ×2 IMPLANT
SYR 30ML LL (SYRINGE) ×2 IMPLANT
TAPE MICROFOAM 4IN (TAPE) ×2 IMPLANT
TOWEL OR 17X26 4PK STRL BLUE (TOWEL DISPOSABLE) ×2 IMPLANT
WND VAC CANISTER 500ML (MISCELLANEOUS) ×2 IMPLANT

## 2017-06-19 NOTE — Anesthesia Procedure Notes (Addendum)
Spinal  Patient location during procedure: OR Start time: 06/19/2017 7:32 AM End time: 06/19/2017 7:44 AM Staffing Anesthesiologist: Gunnar Bulla Performed: anesthesiologist  Preanesthetic Checklist Completed: patient identified, site marked, surgical consent, pre-op evaluation, timeout performed, IV checked, risks and benefits discussed and monitors and equipment checked Spinal Block Patient position: sitting Prep: Betadine Patient monitoring: heart rate, continuous pulse ox, blood pressure and cardiac monitor Approach: midline Location: L3-4 Injection technique: single-shot Needle Needle type: Whitacre and Introducer  Needle gauge: 25 G Needle length: 9 cm Assessment Sensory level: T10 Additional Notes Negative paresthesia. Negative blood return. Positive free-flowing CSF. Expiration date of kit checked and confirmed. Patient tolerated procedure well, without complications.

## 2017-06-19 NOTE — NC FL2 (Signed)
[place] LEVEL OF CARE SCREENING TOOL     IDENTIFICATION  Patient Name: Kyle Gutierrez Birthdate: 06-Dec-1931 Sex: male Admission Date (Current Location): 06/19/2017  [place] and [place] Number:  Engineering geologist and Address:  [facility], [address] [place], [place] [address]      Provider Number: [id number]  Attending Physician Name and Address:  Hessie Knows, MD  Relative Name and Phone Number:       Current Level of Care: Hospital Recommended Level of Care: [place] Prior Approval Number:    Date Approved/Denied:   PASRR Number:   [id number]  Discharge Plan: SNF    Current Diagnoses: Patient Active Problem List   Diagnosis Date Noted  . Primary localized osteoarthritis of right hip 06/19/2017  . Urethral stricture 07/30/2016  . Bilateral carotid artery stenosis 08/10/2014  . Bilateral hearing loss 07/05/2014  . Edema 05/31/2014  . Pain in limb 05/10/2014  . S/P rotator cuff surgery 05/03/2014  . Dizziness and giddiness 05/02/2014  . Cough 08/15/2013  . History of tobacco abuse 08/13/2013  . OSA (obstructive sleep apnea) 06/01/2013  . Pyelonephritis 05/20/2013  . Myoclonic jerking while sleeping 05/20/2013  . Routine general medical examination at a health care facility 04/16/2013  . Long term (current) use of anticoagulants 08/11/2012  . Hematuria, gross 04/11/2012  . Chronic atrial fibrillation ([place])   . Coronary artery disease, non-occlusive   . Carotid artery plaque   . Valvular cardiomyopathy ([place])   . renal cell carcinoma   . Benign prostatic hyperplasia   . Screening for colon cancer 02/14/2012  . Retinopathy   . Pulmonary nodule/lesion, solitary 08/16/2011  . Substance abuse ([place])   . Thrombocytopenia, unspecified ([place])   . Neuropathy, alcoholic ([place])   . Peripheral vascular disease ([place]) 08/06/2011  . LUMBAR RADICULOPATHY, LEFT 05/02/2009  . PERIPHERAL NEUROPATHY  01/05/2009  . HYPERTENSION 12/05/2008  . ATRIAL FIBRILLATION 12/05/2008  . SKIN CANCER, HX OF 12/05/2008  . COLONIC POLYPS, HX OF 12/05/2008  . DIVERTICULITIS, HX OF 12/05/2008    Orientation RESPIRATION BLADDER Height & Weight     Self, Time, Situation, Place  Normal Continent Weight: 206 lb 12.8 oz (93.8 kg) Height:  6' (182.9 cm)  BEHAVIORAL SYMPTOMS/MOOD NEUROLOGICAL BOWEL NUTRITION STATUS      Continent Diet (Clear Liquid to be advanced)  AMBULATORY STATUS COMMUNICATION OF NEEDS Skin   Extensive Assist Verbally Surgical wounds (Incision Right Hip)                       Personal Care Assistance Level of Assistance  Bathing, Feeding, Dressing Bathing Assistance: Limited assistance Feeding assistance: Independent Dressing Assistance: Limited assistance     Functional Limitations Info  Sight, Speech, Hearing Sight Info: Adequate Hearing Info: Adequate Speech Info: Adequate    SPECIAL CARE FACTORS FREQUENCY  PT (By licensed PT), OT (By licensed OT)     PT Frequency:  (5) OT Frequency:  (5)            Contractures      Additional Factors Info  Code Status, Allergies Code Status Info:  (Full Code) Allergies Info:  (ADHESIVE TAPE, LATEX, TAPENTADOL )           Current Medications (06/19/2017):  This is the current hospital active medication list Current Facility-Administered Medications  Medication Dose Route Frequency Provider Last Rate Last Dose  . 0.9 %  sodium chloride infusion   Intravenous Continuous Hessie Knows, MD      .  acetaminophen (TYLENOL) tablet 650 mg  650 mg Oral Q6H PRN Hessie Knows, MD       Or  . acetaminophen (TYLENOL) suppository 650 mg  650 mg Rectal Q6H PRN Hessie Knows, MD      . alum & mag hydroxide-simeth (MAALOX/MYLANTA) 200-200-20 MG/5ML suspension 30 mL  30 mL Oral Q4H PRN Hessie Knows, MD      . bisacodyl (DULCOLAX) suppository 10 mg  10 mg Rectal Daily PRN Hessie Knows, MD      . ceFAZolin (ANCEF) 2 g in dextrose 5  % 100 mL IVPB  2 g Intravenous Q6H Hessie Knows, MD      . ceFAZolin (ANCEF) 2-4 GM/100ML-% IVPB           . diphenhydrAMINE (BENADRYL) 12.5 MG/5ML elixir 12.5-25 mg  12.5-25 mg Oral Q4H PRN Hessie Knows, MD      . docusate sodium (COLACE) capsule 100 mg  100 mg Oral BID Hessie Knows, MD      . Derrill Memo ON 06/20/2017] enoxaparin (LOVENOX) injection 40 mg  40 mg Subcutaneous Q24H Hessie Knows, MD      . losartan (COZAAR) tablet 100 mg  100 mg Oral Daily Hessie Knows, MD       And  . hydrochlorothiazide (MICROZIDE) capsule 12.5 mg  12.5 mg Oral Daily Hessie Knows, MD      . magnesium citrate solution 1 Bottle  1 Bottle Oral Once PRN Hessie Knows, MD      . magnesium hydroxide (MILK OF MAGNESIA) suspension 30 mL  30 mL Oral Daily PRN Hessie Knows, MD      . menthol-cetylpyridinium (CEPACOL) lozenge 3 mg  1 lozenge Oral PRN Hessie Knows, MD       Or  . phenol (CHLORASEPTIC) mouth spray 1 spray  1 spray Mouth/Throat PRN Hessie Knows, MD      . methocarbamol (ROBAXIN) tablet 500 mg  500 mg Oral Q6H PRN Hessie Knows, MD       Or  . methocarbamol (ROBAXIN) 500 mg in dextrose 5 % 50 mL IVPB  500 mg Intravenous Q6H PRN Hessie Knows, MD      . metoCLOPramide (REGLAN) tablet 5-10 mg  5-10 mg Oral Q8H PRN Hessie Knows, MD       Or  . metoCLOPramide (REGLAN) injection 5-10 mg  5-10 mg Intravenous Q8H PRN Hessie Knows, MD      . Derrill Memo ON 06/20/2017] metoprolol succinate (TOPROL-XL) 24 hr tablet 12.5 mg  12.5 mg Oral Daily Hessie Knows, MD      . morphine 2 MG/ML injection 2 mg  2 mg Intravenous Q2H PRN Hessie Knows, MD      . ondansetron [facility]) tablet 4 mg  4 mg Oral Q6H PRN Hessie Knows, MD       Or  . ondansetron [facility]) injection 4 mg  4 mg Intravenous Q6H PRN Hessie Knows, MD      . oxyCODONE (Oxy IR/ROXICODONE) immediate release tablet 5-10 mg  5-10 mg Oral Q3H PRN Hessie Knows, MD      . Derrill Memo ON 06/20/2017] simvastatin (ZOCOR) tablet 40 mg  40 mg Oral q morning - 10a Hessie Knows, MD      . traMADol Veatrice Bourbon) tablet 50 mg  50 mg Oral TID Hessie Knows, MD      . warfarin (COUMADIN) tablet 2 mg  2 mg Oral Q Marily Memos, MD      . Derrill Memo ON 06/23/2017] warfarin (COUMADIN) tablet 2 mg  2 mg  Oral Q Mon-1800 Hessie Knows, MD      . Derrill Memo ON 06/25/2017] warfarin (COUMADIN) tablet 4 mg  4 mg Oral Q Wed-1800 Hessie Knows, MD      . Derrill Memo ON 06/20/2017] warfarin (COUMADIN) tablet 4 mg  4 mg Oral Q Fri-1800 Hessie Knows, MD      . Derrill Memo ON 06/22/2017] warfarin (COUMADIN) tablet 4 mg  4 mg Oral Q Sun-1800 Hessie Knows, MD      . Warfarin - Physician Dosing Inpatient   Does not apply q1800 Hessie Knows, MD      . zolpidem Lorrin Mais) tablet 5 mg  5 mg Oral QHS PRN Hessie Knows, MD         Discharge Medications: Please see discharge summary for a list of discharge medications.  Relevant Imaging Results:  Relevant Lab Results:   Additional Information  (SSN: [id number])  Smith Mince, Student-Social Work

## 2017-06-19 NOTE — Op Note (Signed)
06/19/2017  9:29 AM  PATIENT:  Kyle Gutierrez  83 y.o. male  PRE-OPERATIVE DIAGNOSIS:  PRIMARY OSTEOARTHRITIS OF RIGHT HIP  POST-OPERATIVE DIAGNOSIS:  PRIMARY OSTEOARTHRITIS OF RIGHT HIP  PROCEDURE:  Procedure(s): TOTAL HIP ARTHROPLASTY ANTERIOR APPROACH (Right)  SURGEON: Laurene Footman, MD  ASSISTANTS: None  ANESTHESIA:   spinal  EBL:  Total I/O In: 1200 [I.V.:1200] Out: 650 [Urine:150; Blood:500]  BLOOD ADMINISTERED:none  DRAINS: none   LOCAL MEDICATIONS USED:  MARCA[place]     SPECIMEN:  Source of Specimen:  Right femoral head  DISPOSITION OF SPECIMEN:  PATHOLOGY  COUNTS:  YES  TOURNIQUET:  * No tourniquets in log *  IMPLANTS: Medacta AMIS 5 standard stem with an L 28 mm metal head with Mpact DM cup and liner  DICTATION: .Dragon Dictation   The patient was brought to the operating room and after spinal anesthesia was obtained patient was placed on the operative table with the ipsilateral foot into the Medacta attachment, contralateral leg on a well-padded table. C-arm was brought in and preop template x-ray taken. After prepping and draping in usual sterile fashion appropriate patient identification and timeout procedures were completed. Anterior approach to the hip was obtained and centered over the greater trochanter and TFL muscle. The subcutaneous tissue was incised hemostasis being achieved by electrocautery. TFL fascia was incised and the muscle retracted laterally deep retractor placed. The lateral femoral circumflex vessels were identified and ligated. The anterior capsule was exposed and a capsulotomy performed. The neck was identified and a femoral neck cut carried out with a saw. The head was removed without difficulty and showed sclerotic femoral head and acetabulum. Reaming was carried out to 58 mm and a 58 mm cup trial gave appropriate tightness to the acetabular component a 58 DM cup was impacted into position. The leg was then externally rotated and  ischiofemoral and pubofemoral releases carried out. The femur was sequentially broached to a size 5, size 5 standard width L head trials were placed and the final components chosen. The 5 standard stem was inserted along with a the 8 mm metal 28 mm head and 58 mm liner. The hip was reduced and was stable the wound was thoroughly irrigated and fibrillar placed along the posterior capsular incision and medial neck. The deep fascia was closed using a heavy [place]ill after infiltration of 30 cc of quarter percent Sensorcaine with epinephrine. 3-0 v-loc to close the skin with skin staples with incisional wound VAC applied  PLAN OF CARE: Admit to inpatient

## 2017-06-19 NOTE — Anesthesia Preprocedure Evaluation (Signed)
Anesthesia Evaluation  Patient identified by MRN, date of birth, ID band Patient awake    Reviewed: Allergy & Precautions, NPO status , Patient's Chart, lab work & pertinent test results, reviewed documented beta blocker date and time   Airway Mallampati: III  TM Distance: >3 FB     Dental  (+) Chipped   Pulmonary sleep apnea , former smoker,           Cardiovascular hypertension, Pt. on medications and Pt. on home beta blockers + CAD and + Peripheral Vascular Disease       Neuro/Psych  Neuromuscular disease    GI/Hepatic   Endo/Other    Renal/GU      Musculoskeletal   Abdominal   Peds  Hematology   Anesthesia Other Findings Renal ca.  Reproductive/Obstetrics                             Anesthesia Physical Anesthesia Plan  ASA: III  Anesthesia Plan: Spinal   Post-op Pain Management:    Induction:   PONV Risk Score and Plan:   Airway Management Planned:   Additional Equipment:   Intra-op Plan:   Post-operative Plan:   Informed Consent: I have reviewed the patients History and Physical, chart, labs and discussed the procedure including the risks, benefits and alternatives for the proposed anesthesia with the patient or authorized representative who has indicated his/her understanding and acceptance.     Plan Discussed with: CRNA  Anesthesia Plan Comments:         Anesthesia Quick Evaluation

## 2017-06-19 NOTE — H&P (Signed)
Reviewed paper H+P, will be scanned into chart. No changes noted.  

## 2017-06-19 NOTE — Anesthesia Post-op Follow-up Note (Signed)
Anesthesia QCDR form completed.        

## 2017-06-19 NOTE — Clinical Social Work Placement (Signed)
   CLINICAL SOCIAL WORK PLACEMENT  NOTE  Date:  06/19/2017  Patient Details  Name: Kyle Gutierrez MRN: [id number] Date of Birth: July 13, 1932  Clinical Social Work is seeking post-discharge placement for this patient at the [place] level of care (*CSW will initial, date and re-position this form in  chart as items are completed):  Yes   Patient/family provided with [place] Work Department's list of facilities offering this level of care within the geographic area requested by the patient (or if unable, by the patient's family).  Yes   Patient/family informed of their freedom to choose among providers that offer the needed level of care, that participate in Medicare, Medicaid or managed care program needed by the patient, have an available bed and are willing to accept the patient.  Yes   Patient/family informed of [place]'s ownership interest in [facility] and [facility], as well as of the fact that they are under no obligation to receive care at these facilities.  PASRR submitted to EDS on       PASRR number received on       Existing PASRR number confirmed on 06/19/17     FL2 transmitted to all facilities in geographic area requested by pt/family on 06/19/17     FL2 transmitted to all facilities within larger geographic area on       Patient informed that his/her managed care company has contracts with or will negotiate with certain facilities, including the following:        Yes   Patient/family informed of bed offers received.  Patient chooses bed at  [facility] )     Physician recommends and patient chooses bed at      Patient to be transferred to   on  .  Patient to be transferred to facility by       Patient family notified on   of transfer.  Name of family member notified:        PHYSICIAN       Additional Comment:    _______________________________________________ Ola Fawver, Veronia Beets,  LCSW 06/19/2017, 11:54 AM

## 2017-06-19 NOTE — Transfer of Care (Signed)
Immediate Anesthesia Transfer of Care Note  Patient: Kyle Gutierrez  Procedure(s) Performed: TOTAL HIP ARTHROPLASTY ANTERIOR APPROACH (Right Hip)  Patient Location: PACU  Anesthesia Type:Spinal  Level of Consciousness: sedated  Airway & Oxygen Therapy: Patient Spontanous Breathing and Patient connected to face mask oxygen  Post-op Assessment: Report given to RN and Post -op Vital signs reviewed and stable  Post vital signs: Reviewed and stable  Last Vitals:  Vitals:   06/19/17 0619  BP: (!) 152/81  Pulse: 79  Resp: 12  Temp: 37.1 C  SpO2: 100%    Last Pain:  Vitals:   06/19/17 0619  TempSrc: Tympanic         Complications: No apparent anesthesia complications

## 2017-06-19 NOTE — Progress Notes (Signed)
Physical Therapy Evaluation Patient Details Name: Kyle Gutierrez MRN: [id number] DOB: [birth year] Today's Date: 06/19/2017   History of Present Illness  Pt admitted for osteoarthritis of R hip, s/p R THA on 10/18.   Clinical Impression  Pt is a pleasant 83 year old male s/p R THA. Pt significantly limited in PT participation due to pain, RN was present throughout session to monitor pain status and medications. Pt educated on hip precautions and WB status. Pt required assistance to perform supine there-ex, attempted to go from supine to sit, unable to get into seated position at EOB due to pain, however was able to scoot to [facility] with trapeze. Pt demonstrates deficits with LE strength, ROM, endurance, and functional mobility. Would benefit from further skilled PT services to promote optimal return to home. Recommend DC to SNF upon DC from acute hospitalization. Will continue to progress.     Follow Up Recommendations SNF    Equipment Recommendations  None recommended by PT (Pt has RW)    Recommendations for Other Services       Precautions / Restrictions Precautions Precautions: Anterior Hip;Fall Precaution Booklet Issued: No Precaution Comments: No hip extension, no hip ER Restrictions Weight Bearing Restrictions: Yes RLE Weight Bearing: Weight bearing as tolerated      Mobility  Bed Mobility Overal bed mobility: Needs Assistance Bed Mobility: Supine to Sit     Supine to sit: Mod assist     General bed mobility comments: Cues for proper technique, mod assist to guide RLE off bed. Pt unable to get into seated position at EOB due to pain. Pt able to scoot to [facility] in supine using trapeze and pushing into bed with LLE.    Transfers                 General transfer comment: Deferred, pt unable due to significant pain  Ambulation/Gait             General Gait Details: Deferred  Stairs            Wheelchair Mobility    Modified Rankin (Stroke Patients  Only)       Balance       Sitting balance - Comments: Pt unable to get into seated position at EOB       Standing balance comment: Deferred                             Pertinent Vitals/Pain Pain Assessment: 0-10 Pain Score: 10-Worst pain ever Pain Location: R hip and thigh Pain Descriptors / Indicators: Sore Pain Intervention(s): Limited activity within patient's tolerance;Monitored during session;Premedicated before session;Repositioned;Other (comment) (RN present to check on pain status )    Home Living Family/patient expects to be discharged to:: Private residence [facility]) Living Arrangements: Spouse/significant other [facility]) Available Help at Discharge: Family Type of Home: House Home Access: Stairs to enter Entrance Stairs-Rails: Right;Left;Can reach both Technical brewer of Steps: [place]: One [place]: Environmental consultant - 2 wheels;Cane - single point Additional Comments: Pt reports using cane for amb    Prior Function Level of Independence: Independent with assistive device(s)         Comments: Pt reports independence with ADLS, walks 2 miles 2x/week and plays golf. No assistance needed     Hand Dominance        Extremity/Trunk Assessment   Upper Extremity Assessment Upper Extremity Assessment: Generalized weakness (UE MMT grossly 4/5)  Lower Extremity Assessment Lower Extremity Assessment: Generalized weakness (LLE MMT grossly 4/5)    Cervical / Trunk Assessment Cervical / Trunk Assessment: Normal  Communication   Communication: No difficulties  Cognition Arousal/Alertness: Awake/alert Behavior During Therapy: WFL for tasks assessed/performed Overall Cognitive Status: Within Functional Limits for tasks assessed                                        General Comments      Exercises Other Exercises Other Exercises: Supine therex, ankle pumps, SLRs, hip abd/add. Performed 10x on LLE, 5x on  RLE within small ROM and with min/mod assist due to pain.     Assessment/Plan    PT Assessment Patient needs continued PT services  PT Problem List Decreased strength;Decreased range of motion;Decreased activity tolerance;Decreased mobility;Decreased knowledge of precautions;Pain       PT Treatment Interventions DME instruction;Gait training;Functional mobility training;Stair training;Therapeutic activities;Therapeutic exercise;Patient/family education    PT Goals (Current goals can be found in the Care Plan section)  Acute Rehab PT Goals Patient Stated Goal: to decrease pain and get stronger PT Goal Formulation: With patient Time For Goal Achievement: 07/03/17 Potential to Achieve Goals: Good    Frequency BID   Barriers to discharge        Co-evaluation               AM-PAC PT "6 Clicks" Daily Activity  Outcome Measure Difficulty turning over in bed (including adjusting bedclothes, sheets and blankets)?: Unable Difficulty moving from lying on back to sitting on the side of the bed? : Unable Difficulty sitting down on and standing up from a chair with arms (e.g., wheelchair, bedside commode, etc,.)?: Unable Help needed moving to and from a bed to chair (including a wheelchair)?: Total Help needed walking in hospital room?: Total Help needed climbing 3-5 steps with a railing? : Total 6 Click Score: 6    End of Session   Activity Tolerance: Patient limited by pain Patient left: in bed;with call bell/phone within reach;with bed alarm set;with family/visitor present Nurse Communication: Mobility status;Other (comment) (RN aware of pt's pain level and med history ) PT Visit Diagnosis: Other abnormalities of gait and mobility (R26.89);Muscle weakness (generalized) (M62.81);Pain Pain - Right/Left: Right Pain - part of body: Hip    Time: 7253-6644 PT Time Calculation (min) (ACUTE ONLY): 34 min   Charges:         PT G Codes:   PT G-Codes **NOT FOR INPATIENT  CLASS** Functional Assessment Tool Used: AM-PAC 6 Clicks Basic Mobility Functional Limitation: Mobility: Walking and moving around Mobility: Walking and Moving Around Current Status (I3474): 100 percent impaired, limited or restricted Mobility: Walking and Moving Around Goal Status (Q5956): At least 80 percent but less than 100 percent impaired, limited or restricted    Manfred Arch, SPT  Manfred Arch 06/19/2017, 5:11 PM

## 2017-06-19 NOTE — Clinical Social Work Note (Addendum)
Clinical Social Work Assessment  Patient Details  Name: Kyle Gutierrez MRN: [id number] Date of Birth: 1931-11-14  Date of referral:  06/19/17               Reason for consult:  Facility Placement, Discharge Planning                Permission sought to share information with:  Chartered certified accountant granted to share information::  Yes, Verbal Permission Granted  Name::      [place]::   [place]  Relationship::     Contact Information:     Housing/Transportation Living arrangements for the past 2 months:  [place] of Information:  Patient, Facility Patient Interpreter Needed:  None Criminal Activity/Legal Involvement Pertinent to Current Situation/Hospitalization:  No - Comment as needed Significant Relationships:  Spouse Lives with:  Facility Resident Do you feel safe going back to the place where you live?  Yes Need for family participation in patient care:  Yes (Comment)  Care giving concerns:  Patient and his wife Kyle Gutierrez have been a residents at [facility] for 9 years.   Social Worker assessment / plan:  Holiday representative (CSW) received SNF consult. Patient had surgery today nad PT is pending. CSW received a call from [facility] admissions coordinator at [facility] stating that patient is from independent living at [facility] and has a bed at [facility]. Social work Theatre manager met with patient and his wife/HPOA Kyle Gutierrez [phone]), daughter Kyle Gutierrez, and son-in-law Kyle Gutierrez were bedside. Patient was alert and oriented x4. Social work Theatre manager introduced herself and explained the role of the [place]. Patient stated that he has been a resident at [facility] for 9 years. Social work Theatre manager explained the SNF process to patient. Social work Theatre manager explained that his [employer], requires a 3 night qualifying inpatient hospital stay in order to pay for SNF.  Patient was admitted on 06/19/17. Patient verbalized his understanding and would prefer to complete outpatient therapy. However if PT recommends SNF patient prefers to complete it at [facility]. FL2 completed and faxed out. CSW and Social work Theatre manager will continue to follow up and assist.  Employment status:  Retired Forensic scientist:  Medicare PT Recommendations:  Not assessed at this time Information / Referral to community resources:     Patient/Family's Response to care:  Patient wants to complete outpatient therapy but is willing to do inpatient at [facility], pending PT recommendations.  Patient/Family's Understanding of and Emotional Response to Diagnosis, Current Treatment, and Prognosis:  Patient and his family were pleasant and thanked Social work Theatre manager for her assistance.  Emotional Assessment Appearance:  Appears stated age Attitude/Demeanor/Rapport:    Affect (typically observed):  Adaptable, Calm, Pleasant Orientation:  Oriented to Self, Oriented to Place, Oriented to  Time, Oriented to Situation Alcohol / Substance use:  Not Applicable Psych involvement (Current and /or in the community):  No (Comment)  Discharge Needs  Concerns to be addressed:  Discharge Planning Concerns, Care Coordination Readmission within the last 30 days:  No Current discharge risk:  Dependent with Mobility Barriers to Discharge:  Continued Medical Work up   Smith Mince, Student-Social Work 06/19/2017, 11:53 AM

## 2017-06-20 ENCOUNTER — Encounter: Payer: Self-pay | Admitting: Orthopedic Surgery

## 2017-06-20 LAB — PROTIME-INR
INR: 1.28
PROTHROMBIN TIME: 15.9 s — AB (ref 11.4–15.2)

## 2017-06-20 LAB — BASIC METABOLIC PANEL
ANION GAP: 4 — AB (ref 5–15)
BUN: 26 mg/dL — AB (ref 6–20)
CO2: 28 mmol/L (ref 22–32)
Calcium: 8.3 mg/dL — ABNORMAL LOW (ref 8.9–10.3)
Chloride: 103 mmol/L (ref 101–111)
Creatinine, Ser: 1.26 mg/dL — ABNORMAL HIGH (ref 0.61–1.24)
GFR calc Af Amer: 59 mL/min — ABNORMAL LOW (ref >60)
GFR, EST NON AFRICAN AMERICAN: 51 mL/min — AB (ref >60)
Glucose, Bld: 168 mg/dL — ABNORMAL HIGH (ref 65–99)
POTASSIUM: 4.5 mmol/L (ref 3.5–5.1)
SODIUM: 135 mmol/L (ref 135–145)

## 2017-06-20 LAB — CBC
HCT: 35.3 % — ABNORMAL LOW (ref 40.0–52.0)
Hemoglobin: 12 g/dL — ABNORMAL LOW (ref 13.0–18.0)
MCH: 31.6 pg (ref 26.0–34.0)
MCHC: 34 g/dL (ref 32.0–36.0)
MCV: 92.7 fL (ref 80.0–100.0)
PLATELETS: 129 10*3/uL — AB (ref 150–440)
RBC: 3.81 MIL/uL — AB (ref 4.40–5.90)
RDW: 13.7 % (ref 11.5–14.5)
WBC: 8.8 10*3/uL (ref 3.8–10.6)

## 2017-06-20 MED ORDER — TRAMADOL HCL 50 MG PO TABS: 50.0000 mg | ORAL_TABLET | Freq: Three times a day (TID) | ORAL | Status: DC

## 2017-06-20 MED ORDER — TRAMADOL HCL 50 MG PO TABS: 100.0000 mg | ORAL_TABLET | Freq: Four times a day (QID) | ORAL | 0 refills | Status: DC | PRN

## 2017-06-20 MED ORDER — DOCUSATE SODIUM 100 MG PO CAPS: 100.0000 mg | ORAL_CAPSULE | Freq: Two times a day (BID) | ORAL | 0 refills | Status: DC

## 2017-06-20 MED ORDER — OXYCODONE HCL 5 MG PO TABS: 5.0000 mg | ORAL_TABLET | ORAL | 0 refills | Status: DC | PRN

## 2017-06-20 MED ORDER — TRAMADOL HCL 50 MG PO TABS
100.0000 mg | ORAL_TABLET | Freq: Four times a day (QID) | ORAL | Status: DC
  Administered 2017-06-20 – 2017-06-22 (×9): 100 mg via ORAL
  Filled 2017-06-20 (×9): qty 2

## 2017-06-20 MED ORDER — TAMSULOSIN HCL 0.4 MG PO CAPS
0.8000 mg | ORAL_CAPSULE | Freq: Every day | ORAL | Status: DC
  Administered 2017-06-20 – 2017-06-22 (×3): 0.8 mg via ORAL
  Filled 2017-06-20 (×3): qty 2

## 2017-06-20 NOTE — Discharge Instructions (Signed)
ANTERIOR APPROACH TOTAL HIP REPLACEMENT POSTOPERATIVE DIRECTIONS   Hip Rehabilitation, Guidelines Following Surgery  The results of a hip operation are greatly improved after range of motion and muscle strengthening exercises. Follow all safety measures which are given to protect your hip. If any of these exercises cause increased pain or swelling in your joint, decrease the amount until you are comfortable again. Then slowly increase the exercises. Call your caregiver if you have problems or questions.   HOME CARE INSTRUCTIONS  Remove items at home which could result in a fall. This includes throw rugs or furniture in walking pathways.   ICE to the affected hip every three hours for 30 minutes at a time and then as needed for pain and swelling.  Continue to use ice on the hip for pain and swelling from surgery. You may notice swelling that will progress down to the foot and ankle.  This is normal after surgery.  Elevate the leg when you are not up walking on it.    Continue to use the breathing machine which will help keep your temperature down.  It is common for your temperature to cycle up and down following surgery, especially at night when you are not up moving around and exerting yourself.  The breathing machine keeps your lungs expanded and your temperature down.  Do not place pillow under knee, focus on keeping the knee straight while resting  DIET You may resume your previous home diet once your are discharged from the hospital.  DRESSING / WOUND CARE / SHOWERING Keep the surgical dressing until follow up.  The dressing is water proof, so you can shower without any extra covering.  IF THE DRESSING FALLS OFF or the wound gets wet inside, change the dressing with sterile gauze.  Please use good hand washing techniques before changing the dressing.  Do not use any lotions or creams on the incision until instructed by your surgeon.   Keep your dressing dry with showering.  You can keep it  covered and pat dry. Change the surgical dressing daily and reapply a dry dressing each time.  ACTIVITY Walk with your walker as instructed. Use walker as long as suggested by your caregivers. Avoid periods of inactivity such as sitting longer than an hour when not asleep. This helps prevent blood clots.  You may resume a sexual relationship in one month or when given the OK by your doctor.  You may return to work once you are cleared by your doctor.  Do not drive a car for 6 weeks or until released by you surgeon.  Do not drive while taking narcotics.  WEIGHT BEARING Weight bearing as tolerated with assist device (walker, cane, etc) as directed, use it as long as suggested by your surgeon or therapist, typically at least 4-6 weeks.  POSTOPERATIVE CONSTIPATION PROTOCOL Constipation - defined medically as fewer than three stools per week and severe constipation as less than one stool per week.  One of the most common issues patients have following surgery is constipation.  Even if you have a regular bowel pattern at home, your normal regimen is likely to be disrupted due to multiple reasons following surgery.  Combination of anesthesia, postoperative narcotics, change in appetite and fluid intake all can affect your bowels.  In order to avoid complications following surgery, here are some recommendations in order to help you during your recovery period.  Colace (docusate) - Pick up an over-the-counter form of Colace or another stool softener and take twice  a day as long as you are requiring postoperative pain medications.  Take with a full glass of water daily.  If you experience loose stools or diarrhea, hold the colace until you stool forms back up.  If your symptoms do not get better within 1 week or if they get worse, check with your doctor.  Dulcolax (bisacodyl) - Pick up over-the-counter and take as directed by the product packaging as needed to assist with the movement of your bowels.   Take with a full glass of water.  Use this product as needed if not relieved by Colace only.   MiraLax (polyethylene glycol) - Pick up over-the-counter to have on hand.  MiraLax is a solution that will increase the amount of water in your bowels to assist with bowel movements.  Take as directed and can mix with a glass of water, juice, soda, coffee, or tea.  Take if you go more than two days without a movement. Do not use MiraLax more than once per day. Call your doctor if you are still constipated or irregular after using this medication for 7 days in a row.  If you continue to have problems with postoperative constipation, please contact the office for further assistance and recommendations.  If you experience "the worst abdominal pain ever" or develop nausea or vomiting, please contact the office immediatly for further recommendations for treatment.  ITCHING  If you experience itching with your medications, try taking only a single pain pill, or even half a pain pill at a time.  You can also use Benadryl over the counter for itching or also to help with sleep.   TED HOSE STOCKINGS Wear the elastic stockings on both legs for three weeks following surgery during the day but you may remove then at night for sleeping.  MEDICATIONS See your medication summary on the After Visit Summary that the nursing staff will review with you prior to discharge.  You may have some home medications which will be placed on hold until you complete the course of blood thinner medication.  It is important for you to complete the blood thinner medication as prescribed by your surgeon.  Continue your approved medications as instructed at time of discharge.  PRECAUTIONS If you experience chest pain or shortness of breath - call 911 immediately for transfer to the hospital emergency department.  If you develop a fever greater that 101 F, purulent drainage from wound, increased redness or drainage from wound, foul odor  from the wound/dressing, or calf pain - CONTACT YOUR SURGEON.                                                   FOLLOW-UP APPOINTMENTS Make sure you keep all of your appointments after your operation with your surgeon and caregivers. You should call the office at the above phone number and make an appointment for approximately two weeks after the date of your surgery or on the date instructed by your surgeon outlined in the "After Visit Summary".  RANGE OF MOTION AND STRENGTHENING EXERCISES  These exercises are designed to help you keep full movement of your hip joint. Follow your caregiver's or physical therapist's instructions. Perform all exercises about fifteen times, three times per day or as directed. Exercise both hips, even if you have had only one joint replacement. These exercises can be  done on a training (exercise) mat, on the floor, on a table or on a bed. Use whatever works the best and is most comfortable for you. Use music or television while you are exercising so that the exercises are a pleasant break in your day. This will make your life better with the exercises acting as a break in routine you can look forward to.  Lying on your back, slowly slide your foot toward your buttocks, raising your knee up off the floor. Then slowly slide your foot back down until your leg is straight again.  Lying on your back spread your legs as far apart as you can without causing discomfort.  Lying on your side, raise your upper leg and foot straight up from the floor as far as is comfortable. Slowly lower the leg and repeat.  Lying on your back, tighten up the muscle in the front of your thigh (quadriceps muscles). You can do this by keeping your leg straight and trying to raise your heel off the floor. This helps strengthen the largest muscle supporting your knee.  Lying on your back, tighten up the muscles of your buttocks both with the legs straight and with the knee bent at a comfortable angle while  keeping your heel on the floor.   IF YOU ARE TRANSFERRED TO A SKILLED REHAB FACILITY If the patient is transferred to a skilled rehab facility following release from the hospital, a list of the current medications will be sent to the facility for the patient to continue.  When discharged from the skilled rehab facility, please have the facility set up the patient's [place] prior to being released. Also, the skilled facility will be responsible for providing the patient with their medications at time of release from the facility to include their pain medication, the muscle relaxants, and their blood thinner medication. If the patient is still at the rehab facility at time of the two week follow up appointment, the skilled rehab facility will also need to assist the patient in arranging follow up appointment in our office and any transportation needs.  MAKE SURE YOU:  Understand these instructions.  Get help right away if you are not doing well or get worse.    Pick up stool softner and laxative for home use following surgery while on pain medications. Do not submerge incision under water. Please use good hand washing techniques while changing dressing each day. May shower starting three days after surgery. Please use a clean towel to pat the incision dry following showers. Continue to use ice for pain and swelling after surgery. Do not use any lotions or creams on the incision until instructed by your surgeon.

## 2017-06-20 NOTE — Care Management (Addendum)
RNCM consult for discharge planning. PT recommending SNF. CSW aware and is following. Plan is [facility] at discharge. Will sign off.

## 2017-06-20 NOTE — Progress Notes (Signed)
  Subjective: 1 Day Post-Op Procedure(s) (LRB): TOTAL HIP ARTHROPLASTY ANTERIOR APPROACH (Right) Patient reports pain as moderate.   Patient seen in rounds with Dr. Rudene Christians. Patient is well, and has had no acute complaints or problems Plan is to go Rehab after hospital stay. Negative for chest pain and shortness of breath Fever: no Gastrointestinal: Negative for nausea and vomiting  Objective: Vital signs in last 24 hours: Temp:  [96.8 F (36 C)-97.8 F (36.6 C)] 97.8 F (36.6 C) (10/18 1115) Pulse Rate:  [56-99] 64 (10/18 1500) Resp:  [9-15] 12 (10/18 1115) BP: (100-138)/(51-74) 135/64 (10/18 1500) SpO2:  [96 %-100 %] 98 % (10/18 1115) Weight:  [93.8 kg (206 lb 12.8 oz)] 93.8 kg (206 lb 12.8 oz) (10/18 1115)  Intake/Output from previous day:  Intake/Output Summary (Last 24 hours) at 06/20/17 0736 Last data filed at 06/20/17 0656  Gross per 24 hour  Intake             2490 ml  Output              880 ml  Net             1610 ml    Intake/Output this shift: No intake/output data recorded.  Labs:  Recent Labs  06/19/17 1135 06/20/17 0400  HGB 12.9* 12.0*    Recent Labs  06/19/17 1135 06/20/17 0400  WBC 7.3 8.8  RBC 4.24* 3.81*  HCT 39.3* 35.3*  PLT 130* 129*    Recent Labs  06/19/17 1135 06/20/17 0400  NA  --  135  K  --  4.5  CL  --  103  CO2  --  28  BUN  --  26*  CREATININE 1.13 1.26*  GLUCOSE  --  168*  CALCIUM  --  8.3*    Recent Labs  06/19/17 0611 06/20/17 0400  INR 1.11 1.28     EXAM General - Patient is Alert and Oriented Extremity - Sensation intact distally Dorsiflexion/Plantar flexion intact Compartment soft Dressing/Incision - clean, dry, wound VAC in place Motor Function - intact, moving foot and toes well on exam.   Past Medical History:  Diagnosis Date  . Benign prostatic hypertrophy    s/p TURP  . Carotid artery plaque    bilateral  . Chronic atrial fibrillation ([place])   . Coronary artery disease, non-occlusive   .  Gross hematuria   . Hypertension   . Neuropathy, alcoholic ([facility])    per Neurologic eval, remote  . Nocturia   . renal cell carcinoma 2002   left kidney cancer with partial nephrectomy.  Marland Kitchen Retinopathy    followed by [facility]  . Sleep apnea   . Thrombocytopenia, unspecified ([place])   . Urethral stricture   . Urinary frequency   . Valvular cardiomyopathy ([facility])    severe mitral and tricuspid insufficiency, ECHO July 2012    Assessment/Plan: 1 Day Post-Op Procedure(s) (LRB): TOTAL HIP ARTHROPLASTY ANTERIOR APPROACH (Right) Active Problems:   Primary localized osteoarthritis of right hip  Estimated body mass index is 28.05 kg/m as calculated from the following:   Height as of this encounter: 6' (1.829 m).   Weight as of this encounter: 93.8 kg (206 lb 12.8 oz). Advance diet Up with therapy D/C IV fluids  DVT Prophylaxis - Lovenox, Foot Pumps and TED hose Weight-Bearing as tolerated to right leg  Reche Dixon, PA-C Orthopaedic Surgery 06/20/2017, 7:36 AM

## 2017-06-20 NOTE — Progress Notes (Signed)
Physical Therapy Treatment Patient Details Name: Kyle Gutierrez MRN: [id number] DOB: 1932/06/16 Today's Date: 06/20/2017    History of Present Illness Pt admitted for osteoarthritis of R hip, s/p R THA on 10/18.     PT Comments    Pt is making progress with goals. Reviewed written HEP and precautions. Pt performed supine there-ex, bed mobility with mod assist, transfers with RW with max assist +2, and amb with RW with mod assist +2.  Pt demonstrates significant weakness of both LEs and requires mod-max assist to safely perform transfers and amb due to difficulty supporting full bodyweight, relies heavily on RW for support. Pt able to shift weight side to side, able to pivot feet and take short steps to recliner. Pt requires continuous cues for proper sequencing and feedback with all mobility activities. Pt limited in participation due to pain, however was motivated to participate in all PT activities. Progress with B LE strengthening exercises, standing BW shifting exercises and functional mobility.    Follow Up Recommendations  SNF     Equipment Recommendations  None recommended by PT    Recommendations for Other Services       Precautions / Restrictions Precautions Precautions: Anterior Hip;Fall Precaution Booklet Issued: Yes (comment) Precaution Comments: No hip extension, no hip ER Restrictions Weight Bearing Restrictions: Yes RLE Weight Bearing: Weight bearing as tolerated    Mobility  Bed Mobility Overal bed mobility: Needs Assistance Bed Mobility: Supine to Sit     Supine to sit: Mod assist     General bed mobility comments: Cues for proper technique, requires frequent pauses to rest due to pain, mod assist to guide RLE. Pt able to scoot and get into seated position at EOB.   Transfers Overall transfer level: Needs assistance Equipment used: Rolling walker (2 wheeled) Transfers: Sit to/from Stand Sit to Stand: Max assist;+2 physical assistance          General transfer comment: Pt required cues for proper hand and feet positioning for proper technique. Required blocking of L foot to prevent sliding forward. Pt demonstrated difficulty to BW through BLEs due to pain and weakness, cued to keep knees extended, pt appeared unsteady on feet. Pt relied heavily on RW for UE support during transfer and standing.   Ambulation/Gait Ambulation/Gait assistance: Mod assist;+2 physical assistance Ambulation Distance (Feet): 2 Feet Assistive device: Rolling walker (2 wheeled) Gait Pattern/deviations: Decreased weight shift to right     General Gait Details: Pt initially amb by pivoting feet, able to bear weight through LEs and take short steps. Required continuous cuing for proper step sequencing and navigation of RW throughout amb. Pt relies heavily on RW for support.    Stairs            Wheelchair Mobility    Modified Rankin (Stroke Patients Only)       Balance Overall balance assessment: Needs assistance Sitting-balance support: Feet supported Sitting balance-Leahy Scale: Good Sitting balance - Comments: Pt able to sit at EOB and maintain position with no assist   Standing balance support: Bilateral upper extremity supported Standing balance-Leahy Scale: Poor Standing balance comment: Pt relies heavily on RW for total body support, required +2 mod assistance with standing due to LE weakness to support BW. Pt appears unsteady on feet.                             Cognition Arousal/Alertness: Awake/alert Behavior During Therapy: WFL for tasks assessed/performed Overall Cognitive  Status: Within Functional Limits for tasks assessed                                        Exercises Other Exercises Other Exercises: Therex, 15x RLE, ankle pumps, quad sets, glute sets, hip abd/add, SAQs. Pt requires min assist to initiate exercises due to pain in RLE.  Cues for proper technique.     General Comments         Pertinent Vitals/Pain Pain Assessment: 0-10 Pain Score: 8  Pain Location: R thigh Pain Descriptors / Indicators: Sore Pain Intervention(s): Limited activity within patient's tolerance;Monitored during session;Premedicated before session;Repositioned;RN gave pain meds during session    Home Living                      Prior Function            PT Goals (current goals can now be found in the care plan section) Acute Rehab PT Goals Patient Stated Goal: to decrease pain and get stronger PT Goal Formulation: With patient Time For Goal Achievement: 07/03/17 Potential to Achieve Goals: Good Progress towards PT goals: Progressing toward goals    Frequency    BID      PT Plan Current plan remains appropriate    Co-evaluation              AM-PAC PT "6 Clicks" Daily Activity  Outcome Measure  Difficulty turning over in bed (including adjusting bedclothes, sheets and blankets)?: Unable Difficulty moving from lying on back to sitting on the side of the bed? : Unable Difficulty sitting down on and standing up from a chair with arms (e.g., wheelchair, bedside commode, etc,.)?: Unable Help needed moving to and from a bed to chair (including a wheelchair)?: A Lot Help needed walking in hospital room?: A Lot Help needed climbing 3-5 steps with a railing? : Total 6 Click Score: 8    End of Session Equipment Utilized During Treatment: Gait belt Activity Tolerance: Patient limited by pain Patient left: in chair;with call bell/phone within reach;with chair alarm set   PT Visit Diagnosis: Unsteadiness on feet (R26.81);Other abnormalities of gait and mobility (R26.89);Muscle weakness (generalized) (M62.81);Pain Pain - Right/Left: Right Pain - part of body:  (thigh)     Time: 8338-2505 PT Time Calculation (min) (ACUTE ONLY): 41 min  Charges:                       G Codes:  Functional Assessment Tool Used: AM-PAC 6 Clicks Basic Mobility Functional Limitation:  Mobility: Walking and moving around Mobility: Walking and Moving Around Current Status (L9767): At least 80 percent but less than 100 percent impaired, limited or restricted Mobility: Walking and Moving Around Goal Status [phone]): At least 60 percent but less than 80 percent impaired, limited or restricted    Manfred Arch, SPT   Manfred Arch 06/20/2017, 12:20 PM

## 2017-06-20 NOTE — Progress Notes (Signed)
Plan is for patient to D/C to [facility] Sunday 06/22/17 pending medical clearance. Per [name] admissions coordinator at [facility] patient can come to room 215, RN will call report at [phone]. Clinical Education officer, museum (CSW) sent D/C summary to [facility] Friday via [place]. Patient is aware of above. Patient's wife [name] is at bedside and also aware of above. CSW will continue to follow and assist as needed.   [employer], LCSW [phone]

## 2017-06-20 NOTE — Progress Notes (Signed)
Physical Therapy Treatment Patient Details Name: Kyle Gutierrez MRN: [id number] DOB: June 08, 1932 Today's Date: 06/20/2017    History of Present Illness Pt admitted for osteoarthritis of R hip, s/p R THA on 10/18.     PT Comments    Pt is making progress towards goals. Pt performed seated/standing there-ex, bed mobility with mod assist, transfers with RW with max assist and amb with RW with mod assist, +2 for physical assistance. Pt demonstrates improvements in ability to bear weight through RLE for standing balance/transfers/amb, able to stand for several minutes with RW to use urinal. Pt continues to require cues for proper sequencing of mobility activities and appears to prefer to perform activities slowly. Pt reported pain throughout session, however was motivated to participate in all activities.    Follow Up Recommendations  SNF     Equipment Recommendations  None recommended by PT    Recommendations for Other Services       Precautions / Restrictions Precautions Precautions: Anterior Hip;Fall Precaution Booklet Issued: Yes (comment) Precaution Comments: No hip extension, no hip ER Restrictions Weight Bearing Restrictions: Yes RLE Weight Bearing: Weight bearing as tolerated    Mobility  Bed Mobility Overal bed mobility: Needs Assistance Bed Mobility: Sit to Supine     Supine to sit: Mod assist     General bed mobility comments: cues for proper technique, mod assist to guide RLE in bed, pt able to use LLE to help scoot in bed  Transfers Overall transfer level: Needs assistance Equipment used: Rolling walker (2 wheeled) Transfers: Sit to/from Stand Sit to Stand: Max assist;+2 physical assistance         General transfer comment: Pt required cues for proper hand and feet positioning, cues to lean trunk forward, pt able to lift hips off bed, requires time to rise to full standing position. Pt able to BW through BLEs to transfer. Relies heavily on Rw for  support   Ambulation/Gait Ambulation/Gait assistance: Mod assist;+2 physical assistance Ambulation Distance (Feet): 2 Feet Assistive device: Rolling walker (2 wheeled) Gait Pattern/deviations: Decreased weight shift to left     General Gait Details: Pt shows improvement in ability to bear weight through RLE, requires continuous cues for proper step sequencing   Stairs            Wheelchair Mobility    Modified Rankin (Stroke Patients Only)       Balance Overall balance assessment: Needs assistance Sitting-balance support: Feet supported Sitting balance-Leahy Scale: Good     Standing balance support: Bilateral upper extremity supported Standing balance-Leahy Scale: Poor Standing balance comment: Pt demonstrates improved weight shifting from side to side, able to maintain standing position for several minutes to use urinal                            Cognition Arousal/Alertness: Awake/alert Behavior During Therapy: WFL for tasks assessed/performed Overall Cognitive Status: Within Functional Limits for tasks assessed                                        Exercises Other Exercises Other Exercises: Therex, 15x BLE, ankle pumps, hip abd/add, SLRs, LAQs. Cues and CGA on RLE for proper technique. Pt performed standing side to side weight shifts, 3x30 secs.  Other Exercises: Assisted pt to standing to use urinal for toileting    General Comments  Pertinent Vitals/Pain Pain Assessment: 0-10 Pain Score: 8  Pain Location: R thigh Pain Descriptors / Indicators: Sore Pain Intervention(s): Limited activity within patient's tolerance;Monitored during session;Repositioned;Other (comment) (RN checked in on pt during session)    Home Living                      Prior Function            PT Goals (current goals can now be found in the care plan section) Acute Rehab PT Goals Patient Stated Goal: to decrease pain and get  stronger PT Goal Formulation: With patient Time For Goal Achievement: 07/03/17 Potential to Achieve Goals: Good Progress towards PT goals: Progressing toward goals    Frequency    BID      PT Plan Current plan remains appropriate    Co-evaluation              AM-PAC PT "6 Clicks" Daily Activity  Outcome Measure  Difficulty turning over in bed (including adjusting bedclothes, sheets and blankets)?: Unable Difficulty moving from lying on back to sitting on the side of the bed? : Unable Difficulty sitting down on and standing up from a chair with arms (e.g., wheelchair, bedside commode, etc,.)?: Unable Help needed moving to and from a bed to chair (including a wheelchair)?: A Lot Help needed walking in hospital room?: A Lot Help needed climbing 3-5 steps with a railing? : Total 6 Click Score: 8    End of Session Equipment Utilized During Treatment: Gait belt Activity Tolerance: Patient limited by pain Patient left: in bed;with call bell/phone within reach;with bed alarm set Nurse Communication: Other (comment) PT Visit Diagnosis: Unsteadiness on feet (R26.81);Muscle weakness (generalized) (M62.81);Other abnormalities of gait and mobility (R26.89);Pain Pain - Right/Left: Right Pain - part of body: Hip     Time: 6237-6283 PT Time Calculation (min) (ACUTE ONLY): 31 min  Charges:                       G Codes:  Functional Assessment Tool Used: AM-PAC 6 Clicks Basic Mobility Functional Limitation: Mobility: Walking and moving around Mobility: Walking and Moving Around Current Status (T5176): At least 80 percent but less than 100 percent impaired, limited or restricted Mobility: Walking and Moving Around Goal Status [phone]): At least 60 percent but less than 80 percent impaired, limited or restricted    Manfred Arch, SPT   Manfred Arch 06/20/2017, 4:29 PM

## 2017-06-20 NOTE — Discharge Summary (Addendum)
Physician Discharge Summary  Patient ID: Kyle Gutierrez MRN: [id number] DOB/AGE: [birth year] 83 y.o.  Admit date: 06/19/2017 Discharge date: 06/22/2017  Admission Diagnoses:  PRIMARY OSTEOARTHRITIS OF RIGHT HIP   Discharge Diagnoses: Patient Active Problem List   Diagnosis Date Noted  . Primary localized osteoarthritis of right hip 06/19/2017  . Urethral stricture 07/30/2016  . Bilateral carotid artery stenosis 08/10/2014  . Bilateral hearing loss 07/05/2014  . Edema 05/31/2014  . Pain in limb 05/10/2014  . S/P rotator cuff surgery 05/03/2014  . Dizziness and giddiness 05/02/2014  . Cough 08/15/2013  . History of tobacco abuse 08/13/2013  . OSA (obstructive sleep apnea) 06/01/2013  . Pyelonephritis 05/20/2013  . Myoclonic jerking while sleeping 05/20/2013  . Routine general medical examination at a health care facility 04/16/2013  . Long term (current) use of anticoagulants 08/11/2012  . Hematuria, gross 04/11/2012  . Chronic atrial fibrillation ([place])   . Coronary artery disease, non-occlusive   . Carotid artery plaque   . Valvular cardiomyopathy ([place])   . renal cell carcinoma   . Benign prostatic hyperplasia   . Screening for colon cancer 02/14/2012  . Retinopathy   . Pulmonary nodule/lesion, solitary 08/16/2011  . Substance abuse ([place])   . Thrombocytopenia, unspecified ([place])   . Neuropathy, alcoholic ([place])   . Peripheral vascular disease ([place]) 08/06/2011  . LUMBAR RADICULOPATHY, LEFT 05/02/2009  . PERIPHERAL NEUROPATHY 01/05/2009  . HYPERTENSION 12/05/2008  . ATRIAL FIBRILLATION 12/05/2008  . SKIN CANCER, HX OF 12/05/2008  . COLONIC POLYPS, HX OF 12/05/2008  . DIVERTICULITIS, HX OF 12/05/2008    Past Medical History:  Diagnosis Date  . Benign prostatic hypertrophy    s/p TURP  . Carotid artery plaque    bilateral  . Chronic atrial fibrillation ([place])   . Coronary artery disease, non-occlusive   . Gross hematuria   . Hypertension   . Neuropathy,  alcoholic ([facility])    per Neurologic eval, remote  . Nocturia   . renal cell carcinoma 2002   left kidney cancer with partial nephrectomy.  Marland Kitchen Retinopathy    followed by [facility]  . Sleep apnea   . Thrombocytopenia, unspecified ([place])   . Urethral stricture   . Urinary frequency   . Valvular cardiomyopathy ([place])    severe mitral and tricuspid insufficiency, ECHO July 2012     Transfusion: none   Consultants (if any):   Discharged Condition: Improved  Hospital Course: [name] is an 83 y.o. male who was admitted 06/19/2017 with a diagnosis of right hip osteoarthritis and went to the operating room on 06/19/2017 and underwent the above named procedures.    Surgeries: Procedure(s): TOTAL HIP ARTHROPLASTY ANTERIOR APPROACH on 06/19/2017 Patient tolerated the surgery well. Taken to PACU where she was stabilized and then transferred to the orthopedic floor.  Started on Lovenox 40 q 24 hrs. Patient bridged with lovenox and continued with home coumadin. Foot pumps applied bilaterally at 80 mm. Heels elevated on bed with rolled towels. No evidence of DVT. Negative [place]. Physical therapy started on day #1 for gait training and transfer. OT started day #1 for ADL and assisted devices.  On post op day 2 and 3 patients vital signs and labs were with in normal limits. Patients pain was controlled. He made slow progress with PT. He was stable and ready for discharge on post op day 3.   Changed dressing daily. Apply abd pad and paper tape daily. Apply antibiotic ointment to skin blisters daily.  Continue with lovenox and coumadin  daily until INR is between 2.0-3.0. Once INR is therapeutic between 2.0-3.0, discontinue lovenox and continue with coumadin.  TED hose daily x 6 weeks. Remove at night time only       Implants: Medacta AMIS 5 standard stem with an L 28 mm metal head with Mpact DM cup and liner  He was given perioperative antibiotics:  Anti-infectives    Start      Dose/Rate Route Frequency Ordered Stop   06/19/17 1400  ceFAZolin (ANCEF) IVPB 2g/100 mL premix  Status:  Discontinued     2 g 200 mL/hr over 30 Minutes Intravenous Every 6 hours 06/19/17 1108 06/19/17 1112   06/19/17 1400  ceFAZolin (ANCEF) 2 g in dextrose 5 % 100 mL IVPB     2 g 200 mL/hr over 30 Minutes Intravenous Every 6 hours 06/19/17 1112 06/20/17 0137   06/19/17 0559  ceFAZolin (ANCEF) 2-4 GM/100ML-% IVPB    Comments:  Jeanene Erb   : cabinet override      06/19/17 0559 06/19/17 1814   06/18/17 2300  ceFAZolin (ANCEF) IVPB 2g/100 mL premix  Status:  Discontinued     2 g 200 mL/hr over 30 Minutes Intravenous  Once 06/18/17 2247 06/19/17 1107    .  He was given sequential compression devices, early ambulation, and coumadin for DVT prophylaxis.   He benefited maximally from the hospital stay and there were no complications.    Recent vital signs:  Vitals:   06/21/17 2112 06/22/17 0735  BP: (!) 101/49 (!) 104/49  Pulse: 91 85  Resp: 18 18  Temp: 98.3 F (36.8 C) 97.8 F (36.6 C)  SpO2: 93% 93%    Recent laboratory studies:  Lab Results  Component Value Date   HGB 12.0 (L) 06/20/2017   HGB 12.9 (L) 06/19/2017   HGB 13.6 06/04/2017   Lab Results  Component Value Date   WBC 8.8 06/20/2017   PLT 129 (L) 06/20/2017   Lab Results  Component Value Date   INR 1.26 06/22/2017   Lab Results  Component Value Date   NA 135 06/20/2017   K 4.5 06/20/2017   CL 103 06/20/2017   CO2 28 06/20/2017   BUN 26 (H) 06/20/2017   CREATININE 1.26 (H) 06/20/2017   GLUCOSE 168 (H) 06/20/2017    Discharge Medications:   Allergies as of 06/22/2017      Reactions   Adhesive [tape] Rash   OK to use paper tape   Latex Rash   Tapentadol Rash      Medication List    TAKE these medications   acetaminophen 500 MG tablet Commonly known as:  TYLENOL Take 1,000 mg by mouth 3 (three) times daily.   docusate sodium 100 MG capsule Commonly known as:  COLACE Take 1 capsule  (100 mg total) by mouth 2 (two) times daily.   enoxaparin 40 MG/0.4ML injection Commonly known as:  LOVENOX Inject 0.4 mLs (40 mg total) into the skin daily. Until INR is therapeutic. CHECK INR DAILY   losartan-hydrochlorothiazide 100-12.5 MG tablet Commonly known as:  HYZAAR Take 1 tablet by mouth daily.   metoprolol succinate 25 MG 24 hr tablet Commonly known as:  TOPROL-XL Take 12.5 mg by mouth daily. Takes 1/2 tablet   oxyCODONE 5 MG immediate release tablet Commonly known as:  Oxy IR/ROXICODONE Take 1-2 tablets (5-10 mg total) by mouth every 4 (four) hours as needed for breakthrough pain.   simvastatin 40 MG tablet Commonly known as:  ZOCOR Take 1 tablet (  40 mg total) by mouth every morning.   tamsulosin 0.4 MG Caps capsule Commonly known as:  FLOMAX Take 2 capsules (0.8 mg total) by mouth daily.   traMADol 50 MG tablet Commonly known as:  ULTRAM Take 2 tablets (100 mg total) by mouth every 6 (six) hours as needed. What changed:  how much to take  when to take this  reasons to take this   warfarin 4 MG tablet Commonly known as:  COUMADIN Take 2-4 mg by mouth See admin instructions. TAKE 1 TABLET (4 MG) BY MOUTH ON Wednesday, Friday, AND Sunday. TAKE 0.5 TABLET (2 MG) BY MOUTH ON Monday, Tuesday, Thursday, & Saturday.            Durable Medical Equipment        Start     Ordered   06/20/17 0842  For home use only DME continuous positive airway pressure (CPAP)  Once    Question Answer Comment  Patient has OSA or probable OSA Yes   Settings Other see comments   CPAP supplies needed Mask, headgear, cushions, filters, heated tubing and water chamber      06/20/17 0841   06/19/17 1108  DME Walker rolling  Once    Question:  Patient needs a walker to treat with the following condition  Answer:  Status post total hip replacement, right   06/19/17 1108   06/19/17 1108  DME 3 n 1  Once     10 /18/18 1108   06/19/17 1108  DME Bedside commode  Once    Question:   Patient needs a bedside commode to treat with the following condition  Answer:  Status post total hip replacement, right   06/19/17 1108      Diagnostic Studies: Dg Hip Operative Unilat W Or W/o Pelvis Right  Result Date: 06/19/2017 CLINICAL DATA:  83 year old male undergoing right hip arthroplasty. EXAM: OPERATIVE RIGHT HIP (WITH PELVIS IF PERFORMED) 2  VIEWS TECHNIQUE: Fluoroscopic spot image(s) were submitted for interpretation post-operatively. COMPARISON:  CT Abdomen and Pelvis 04/03/2015. FINDINGS: 2 intraoperative fluoroscopic spot images of the right hip demonstrating bipolar type arthroplasty placement on the second image. Fluoroscopy time 0 minutes 18 seconds IMPRESSION: Right bipolar type hip arthroplasty. Electronically Signed   By: Genevie Ann M.D.   On: 06/19/2017 09:20   Dg Hip Unilat W Or W/o Pelvis 2-3 Views Right  Result Date: 06/19/2017 CLINICAL DATA:  Postop right hip replacement EXAM: DG HIP (WITH OR WITHOUT PELVIS) 2-3V RIGHT COMPARISON:  None. FINDINGS: Changes of right hip replacement. Normal alignment. No hardware bony complicating feature. IMPRESSION: Right replacement.  No visible complicating feature. Electronically Signed   By: Rolm Baptise M.D.   On: 06/19/2017 10:16    Disposition: 01-Home or Self Care     Contact information for follow-up providers    Hessie Knows, MD Follow up in 2 week(s).   Specialty:  Orthopedic Surgery Why:  For staple removal Contact information: [place] [address] [phone]            Contact information for after-discharge care    Destination    [facility] Follow up.   Specialty:  [place] Contact information: [place] [phone]                   Signed: Feliberto Gottron 06/22/2017, 10:21 AM

## 2017-06-21 LAB — PROTIME-INR
INR: 1.3
Prothrombin Time: 16.1 seconds — ABNORMAL HIGH (ref 11.4–15.2)

## 2017-06-21 MED ORDER — LACTULOSE 10 GM/15ML PO SOLN
10.0000 g | Freq: Once | ORAL | Status: AC
  Administered 2017-06-21: 10 g via ORAL
  Filled 2017-06-21: qty 30

## 2017-06-21 NOTE — Progress Notes (Signed)
   Subjective: 2 Days Post-Op Procedure(s) (LRB): TOTAL HIP ARTHROPLASTY ANTERIOR APPROACH (Right) Patient reports pain as 8 on 0-10 scale.   Patient is well, and has had no acute complaints or problems Denies any CP, SOB, ABD pain. We will continue therapy today.  Plan is to go Skilled nursing facility after hospital stay.  Objective: Vital signs in last 24 hours: Temp:  [98.4 F (36.9 C)-98.6 F (37 C)] 98.6 F (37 C) (10/20 0729) Pulse Rate:  [89-92] 89 (10/20 0729) Resp:  [18] 18 (10/20 0729) BP: (100-122)/(49-62) 100/49 (10/20 0729) SpO2:  [90 %-92 %] 90 % (10/20 0729)  Intake/Output from previous day: 10/19 0701 - 10/20 0700 In: 700 [P.O.:600; I.V.:100] Out: 100 [Urine:100] Intake/Output this shift: Total I/O In: -  Out: 100 [Urine:100]   Recent Labs  06/19/17 1135 06/20/17 0400  HGB 12.9* 12.0*    Recent Labs  06/19/17 1135 06/20/17 0400  WBC 7.3 8.8  RBC 4.24* 3.81*  HCT 39.3* 35.3*  PLT 130* 129*    Recent Labs  06/19/17 1135 06/20/17 0400  NA  --  135  K  --  4.5  CL  --  103  CO2  --  28  BUN  --  26*  CREATININE 1.13 1.26*  GLUCOSE  --  168*  CALCIUM  --  8.3*    Recent Labs  06/20/17 0400 06/21/17 0324  INR 1.28 1.30    EXAM General - Patient is Alert, Appropriate and Oriented Extremity - Neurovascular intact Sensation intact distally Intact pulses distally Dorsiflexion/Plantar flexion intact No cellulitis present Compartment soft Dressing - dressing C/D/I and wound vac intact Motor Function - intact, moving foot and toes well on exam.   Past Medical History:  Diagnosis Date  . Benign prostatic hypertrophy    s/p TURP  . Carotid artery plaque    bilateral  . Chronic atrial fibrillation ([place])   . Coronary artery disease, non-occlusive   . Gross hematuria   . Hypertension   . Neuropathy, alcoholic ([facility])    per Neurologic eval, remote  . Nocturia   . renal cell carcinoma 2002   left kidney cancer with partial  nephrectomy.  Marland Kitchen Retinopathy    followed by [facility]  . Sleep apnea   . Thrombocytopenia, unspecified ([place])   . Urethral stricture   . Urinary frequency   . Valvular cardiomyopathy ([facility])    severe mitral and tricuspid insufficiency, ECHO July 2012    Assessment/Plan:   2 Days Post-Op Procedure(s) (LRB): TOTAL HIP ARTHROPLASTY ANTERIOR APPROACH (Right) Active Problems:   Primary localized osteoarthritis of right hip  Estimated body mass index is 28.05 kg/m as calculated from the following:   Height as of this encounter: 6' (1.829 m).   Weight as of this encounter: 93.8 kg (206 lb 12.8 oz). Advance diet Up with therapy  Needs BM Encouraged incentive spirometer INR slowly trending up. Continue with lovenox and coumadin Plan on discharge tomorrow to SNF   DVT Prophylaxis - Lovenox and Coumadin Weight-Bearing as tolerated to right leg   T. Rachelle Hora, PA-C [place] 06/21/2017, 9:22 AM

## 2017-06-21 NOTE — Progress Notes (Signed)
Physical Therapy Treatment Patient Details Name: Kyle Gutierrez MRN: [id number] DOB: 04/10/1932 Today's Date: 06/21/2017    History of Present Illness Pt admitted for osteoarthritis of R hip, s/p R THA on 10/18.     PT Comments    Participated in exercises as described below.  Pt reported exercises felt better this am than last session.  To edge of bed with mod a x 1.  Once sitting able to maintain sitting without LOB but should have supervision for safety.  Stood with mod a x 2 from raised bed.  Verbal cues to stand fully with walker and for proper hand placements.  Once standing, he was able to turn to recliner at bedside with min/mod a x 2.  Generally unsteady but able to recover LOB's without assist.  He continues to require +2 assist for mobility skills due to assist level and safety.  Remained up in chair at end of session.   Follow Up Recommendations  SNF     Equipment Recommendations  None recommended by PT    Recommendations for Other Services       Precautions / Restrictions Precautions Precautions: Anterior Hip;Fall Precaution Comments: No hip extension, no hip ER Restrictions Weight Bearing Restrictions: Yes RLE Weight Bearing: Weight bearing as tolerated    Mobility  Bed Mobility Overal bed mobility: Needs Assistance Bed Mobility: Supine to Sit     Supine to sit: Mod assist;+2 for physical assistance     General bed mobility comments: cues for proper technique, min assist to guide RLE in bed, pt able to use LLE to help scoot in bed  Transfers Overall transfer level: Needs assistance Equipment used: Rolling walker (2 wheeled) Transfers: Sit to/from Stand Sit to Stand: Mod assist;+2 physical assistance         General transfer comment: Verbal cues for hand placements  Ambulation/Gait Ambulation/Gait assistance: Mod assist;+2 physical assistance Ambulation Distance (Feet): 2 Feet Assistive device: Rolling walker (2 wheeled) Gait  Pattern/deviations: Step-to pattern;Decreased weight shift to left   Gait velocity interpretation: <1.8 ft/sec, indicative of risk for recurrent falls General Gait Details: Pt shows improvement in ability to bear weight through RLE, requires continuous cues for proper step sequencing   Stairs            Wheelchair Mobility    Modified Rankin (Stroke Patients Only)       Balance Overall balance assessment: Needs assistance Sitting-balance support: Feet supported Sitting balance-Leahy Scale: Good Sitting balance - Comments: Pt able to sit at EOB and maintain position with no assist   Standing balance support: Bilateral upper extremity supported Standing balance-Leahy Scale: Poor                              Cognition Arousal/Alertness: Awake/alert Behavior During Therapy: WFL for tasks assessed/performed Overall Cognitive Status: Within Functional Limits for tasks assessed                                        Exercises Other Exercises Other Exercises: Therex, 10x BLE, ankle pumps, hip abd/add, SAQ, pillow squeeze, SLRs, LAQs. Cues and AAROM RLE for proper technique.     General Comments        Pertinent Vitals/Pain Pain Assessment: 0-10 Pain Score: 7  Pain Location: R thigh Pain Descriptors / Indicators: Sore;Operative site guarding Pain Intervention(s): Limited activity within patient's  tolerance;Premedicated before session    Home Living                      Prior Function            PT Goals (current goals can now be found in the care plan section) Progress towards PT goals: Progressing toward goals    Frequency    BID      PT Plan Current plan remains appropriate    Co-evaluation              AM-PAC PT "6 Clicks" Daily Activity  Outcome Measure  Difficulty turning over in bed (including adjusting bedclothes, sheets and blankets)?: Unable Difficulty moving from lying on back to sitting on the  side of the bed? : Unable Difficulty sitting down on and standing up from a chair with arms (e.g., wheelchair, bedside commode, etc,.)?: Unable Help needed moving to and from a bed to chair (including a wheelchair)?: A Lot Help needed walking in hospital room?: A Lot Help needed climbing 3-5 steps with a railing? : Total 6 Click Score: 8    End of Session Equipment Utilized During Treatment: Gait belt Activity Tolerance: Patient limited by pain Patient left: in chair;with chair alarm set;with call bell/phone within reach Nurse Communication: Mobility status Pain - Right/Left: Right Pain - part of body: Hip     Time: [id number] PT Time Calculation (min) (ACUTE ONLY): 24 min  Charges:  $Therapeutic Exercise: 8-22 mins $Therapeutic Activity: 8-22 mins                    G Codes:       Chesley Noon, PTA 06/21/17, 10:07 AM

## 2017-06-21 NOTE — Progress Notes (Signed)
Physical Therapy Treatment Patient Details Name: Kyle Gutierrez MRN: [id number] DOB: 12-11-1931 Today's Date: 06/21/2017    History of Present Illness Pt admitted for osteoarthritis of R hip, s/p R THA on 10/18.     PT Comments    Pt is walking with 2 person assist, mainly at his request since he can stand and control mobility more.  Will continue to work on strengthening and use care with his vac and other lines for maintenance of safety.  Pt is motivated to keep working and will try to significantly increase his gait distance tomorrow.   Follow Up Recommendations  SNF     Equipment Recommendations  None recommended by PT    Recommendations for Other Services       Precautions / Restrictions Precautions Precautions: Fall Restrictions Weight Bearing Restrictions: Yes RLE Weight Bearing: Weight bearing as tolerated    Mobility  Bed Mobility Overal bed mobility: Needs Assistance Bed Mobility: Supine to Sit     Supine to sit: Mod assist;+2 for physical assistance     General bed mobility comments: cues for proper technique, min assist to guide RLE in bed, pt able to use LLE to help scoot in bed  Transfers Overall transfer level: Needs assistance Equipment used: Rolling walker (2 wheeled) Transfers: Sit to/from Stand Sit to Stand: Mod assist         General transfer comment: Verbal cues for hand placements  Ambulation/Gait Ambulation/Gait assistance: Mod assist (second person for safety at pt request) Ambulation Distance (Feet): 10 Feet Assistive device: Rolling walker (2 wheeled) Gait Pattern/deviations: Step-to pattern;Decreased weight shift to right Gait velocity: reduced Gait velocity interpretation: Below normal speed for age/gender General Gait Details: Pt shows improvement in ability to bear weight through RLE, requires continuous cues for proper step sequencing   Stairs            Wheelchair Mobility    Modified Rankin (Stroke Patients  Only)       Balance Overall balance assessment: Needs assistance Sitting-balance support: Feet supported;Bilateral upper extremity supported Sitting balance-Leahy Scale: Good Sitting balance - Comments: sits EOB and worked on controlling his balance   Standing balance support: Bilateral upper extremity supported;During functional activity Standing balance-Leahy Scale: Poor Standing balance comment: pt followed instructions to maneuver walker and could control tendency to shift walker laterally                            Cognition Arousal/Alertness: Awake/alert Behavior During Therapy: WFL for tasks assessed/performed Overall Cognitive Status: Within Functional Limits for tasks assessed                                        Exercises Other Exercises Other Exercises: Ankle pumps, hip abd/add, hip an dknee flexion assisted    General Comments        Pertinent Vitals/Pain Pain Assessment: 0-10 Pain Score: 7  Pain Location: R thigh Pain Descriptors / Indicators: Sore;Operative site guarding    Home Living                      Prior Function            PT Goals (current goals can now be found in the care plan section) Acute Rehab PT Goals PT Goal Formulation: With patient Progress towards PT goals: Progressing toward goals  Frequency    BID      PT Plan Current plan remains appropriate    Co-evaluation              AM-PAC PT "6 Clicks" Daily Activity  Outcome Measure  Difficulty turning over in bed (including adjusting bedclothes, sheets and blankets)?: Unable Difficulty moving from lying on back to sitting on the side of the bed? : Unable Difficulty sitting down on and standing up from a chair with arms (e.g., wheelchair, bedside commode, etc,.)?: Unable Help needed moving to and from a bed to chair (including a wheelchair)?: A Lot Help needed walking in hospital room?: A Lot Help needed climbing 3-5 steps with  a railing? : A Lot 6 Click Score: 9    End of Session Equipment Utilized During Treatment: Gait belt Activity Tolerance: Patient limited by pain Patient left: in chair;with call bell/phone within reach;with chair alarm set;with family/visitor present Nurse Communication: Mobility status PT Visit Diagnosis: Unsteadiness on feet (R26.81);Muscle weakness (generalized) (M62.81);Other abnormalities of gait and mobility (R26.89);Pain Pain - Right/Left: Right Pain - part of body: Hip     Time: 1500-1540 PT Time Calculation (min) (ACUTE ONLY): 40 min  Charges:  $Gait Training: 8-22 mins $Therapeutic Exercise: 8-22 mins $Therapeutic Activity: 8-22 mins                    G Codes:  Functional Assessment Tool Used: AM-PAC 6 Clicks Basic Mobility Functional Limitation: Mobility: Walking and moving around Mobility: Walking and Moving Around Current Status (H6808): At least 40 percent but less than 60 percent impaired, limited or restricted Mobility: Walking and Moving Around Goal Status [phone]): At least 1 percent but less than 20 percent impaired, limited or restricted     Ramond Dial 06/21/2017, 4:46 PM   Mee Hives, PT MS Acute Rehab Dept. Number: [place] and [place]

## 2017-06-21 NOTE — Anesthesia Postprocedure Evaluation (Signed)
Anesthesia Post Note  Patient: Kyle Gutierrez  Procedure(s) Performed: TOTAL HIP ARTHROPLASTY ANTERIOR APPROACH (Right Hip)  Patient location during evaluation: PACU Anesthesia Type: Spinal Level of consciousness: oriented and awake and alert Pain management: pain level controlled Vital Signs Assessment: post-procedure vital signs reviewed and stable Respiratory status: spontaneous breathing, respiratory function stable and patient connected to nasal cannula oxygen Cardiovascular status: blood pressure returned to baseline and stable Postop Assessment: no headache, no backache and no apparent nausea or vomiting Anesthetic complications: no     Last Vitals:  Vitals:   06/20/17 2016 06/21/17 0729  BP: 122/62 (!) 100/49  Pulse: 92 89  Resp: 18 18  Temp: 36.9 C 37 C  SpO2: 92% 90%    Last Pain:  Vitals:   06/21/17 0729  TempSrc: Oral  PainSc: [place]

## 2017-06-22 LAB — PROTIME-INR
INR: 1.26
Prothrombin Time: 15.7 seconds — ABNORMAL HIGH (ref 11.4–15.2)

## 2017-06-22 MED ORDER — ENOXAPARIN SODIUM 40 MG/0.4ML ~~LOC~~ SOLN: 40.0000 mg | SUBCUTANEOUS | 0 refills | Status: DC

## 2017-06-22 MED ORDER — TRAMADOL HCL 50 MG PO TABS: 50.0000 mg | ORAL_TABLET | Freq: Three times a day (TID) | ORAL | 0 refills | Status: DC

## 2017-06-22 MED ORDER — TRAMADOL HCL 50 MG PO TABS: 100.0000 mg | ORAL_TABLET | Freq: Four times a day (QID) | ORAL | 0 refills | Status: DC | PRN

## 2017-06-22 MED ORDER — TAMSULOSIN HCL 0.4 MG PO CAPS
0.8000 mg | ORAL_CAPSULE | Freq: Every day | ORAL | 0 refills | Status: DC
Start: 2017-06-22 — End: 2018-06-18

## 2017-06-22 NOTE — Progress Notes (Signed)
Report called to [place] at [facility]. EMS called. IV removed. Patient updated. Waiting on EMS.

## 2017-06-22 NOTE — Clinical Social Work Note (Signed)
The patient will discharge to [facility] today via non-emergent EMS. The RN will call report to [phone]. The packet has been delivered and does not require a DNR or MOST form due to Full Code. The patient, his family, and the facility are aware and in agreement. CSW will continue to follow pending additional discharge needs.  Santiago Bumpers, MSW, [facility] [phone]

## 2017-06-22 NOTE — Progress Notes (Addendum)
   Subjective: 3 Days Post-Op Procedure(s) (LRB): TOTAL HIP ARTHROPLASTY ANTERIOR APPROACH (Right) Patient reports pain as 7 on 0-10 scale.   Patient is well, and has had no acute complaints or problems Denies any CP, SOB, ABD pain. We will continue therapy today.  Plan is to go Skilled nursing facility after hospital stay.  Objective: Vital signs in last 24 hours: Temp:  [97.8 F (36.6 C)-98.8 F (37.1 C)] 97.8 F (36.6 C) (10/21 0735) Pulse Rate:  [84-91] 85 (10/21 0735) Resp:  [18-20] 18 (10/21 0735) BP: (101-104)/(44-49) 104/49 (10/21 0735) SpO2:  [93 %] 93 % (10/21 0735)  Intake/Output from previous day: 10/20 0701 - 10/21 0700 In: 240 [P.O.:240] Out: 100 [Urine:100] Intake/Output this shift: No intake/output data recorded.   Recent Labs  06/19/17 1135 06/20/17 0400  HGB 12.9* 12.0*    Recent Labs  06/19/17 1135 06/20/17 0400  WBC 7.3 8.8  RBC 4.24* 3.81*  HCT 39.3* 35.3*  PLT 130* 129*    Recent Labs  06/19/17 1135 06/20/17 0400  NA  --  135  K  --  4.5  CL  --  103  CO2  --  28  BUN  --  26*  CREATININE 1.13 1.26*  GLUCOSE  --  168*  CALCIUM  --  8.3*    Recent Labs  06/21/17 0324 06/22/17 0331  INR 1.30 1.26    EXAM General - Patient is Alert, Appropriate and Oriented Extremity - Neurovascular intact Sensation intact distally Intact pulses distally Dorsiflexion/Plantar flexion intact No cellulitis present Compartment soft Dressing - dressing C/D/I. WOund vac leaking. Mild skin irritation around wound vac system. Motor Function - intact, moving foot and toes well on exam.   Past Medical History:  Diagnosis Date  . Benign prostatic hypertrophy    s/p TURP  . Carotid artery plaque    bilateral  . Chronic atrial fibrillation ([place])   . Coronary artery disease, non-occlusive   . Gross hematuria   . Hypertension   . Neuropathy, alcoholic ([facility])    per Neurologic eval, remote  . Nocturia   . renal cell carcinoma 2002   left  kidney cancer with partial nephrectomy.  Marland Kitchen Retinopathy    followed by [facility]  . Sleep apnea   . Thrombocytopenia, unspecified ([place])   . Urethral stricture   . Urinary frequency   . Valvular cardiomyopathy ([facility])    severe mitral and tricuspid insufficiency, ECHO July 2012    Assessment/Plan:   3 Days Post-Op Procedure(s) (LRB): TOTAL HIP ARTHROPLASTY ANTERIOR APPROACH (Right) Active Problems:   Primary localized osteoarthritis of right hip  Estimated body mass index is 28.05 kg/m as calculated from the following:   Height as of this encounter: 6' (1.829 m).   Weight as of this encounter: 93.8 kg (206 lb 12.8 oz). Advance diet Up with therapy  DC wound vac and apply abd pad with paper tape. Change pad daily Continue with Lovenox and coumadin until INR therapeutic. Once INR is between 2.0-3.0 discontinue lovenox resume coumdain Plan on discharge to SNF today Follow up with [facility] in 2 weeks   DVT Prophylaxis - Lovenox and Coumadin Weight-Bearing as tolerated to right leg   T. Rachelle Hora, PA-C [place] 06/22/2017, 8:44 AM

## 2017-06-23 LAB — SURGICAL PATHOLOGY

## 2017-06-24 DIAGNOSIS — I2510 Atherosclerotic heart disease of native coronary artery without angina pectoris: Secondary | ICD-10-CM

## 2017-06-24 DIAGNOSIS — M1611 Unilateral primary osteoarthritis, right hip: Secondary | ICD-10-CM | POA: Diagnosis not present

## 2017-06-24 DIAGNOSIS — G609 Hereditary and idiopathic neuropathy, unspecified: Secondary | ICD-10-CM | POA: Diagnosis not present

## 2017-06-24 DIAGNOSIS — N400 Enlarged prostate without lower urinary tract symptoms: Secondary | ICD-10-CM | POA: Diagnosis not present

## 2017-06-24 DIAGNOSIS — M4820 Kissing spine, site unspecified: Secondary | ICD-10-CM

## 2017-06-30 ENCOUNTER — Ambulatory Visit: Payer: Medicare Other | Admitting: Podiatry

## 2017-07-07 DIAGNOSIS — I878 Other specified disorders of veins: Secondary | ICD-10-CM | POA: Insufficient documentation

## 2017-07-21 ENCOUNTER — Ambulatory Visit (INDEPENDENT_AMBULATORY_CARE_PROVIDER_SITE_OTHER): Payer: Medicare Other | Admitting: Podiatry

## 2017-07-21 ENCOUNTER — Encounter: Payer: Self-pay | Admitting: Podiatry

## 2017-07-21 DIAGNOSIS — M79609 Pain in unspecified limb: Secondary | ICD-10-CM

## 2017-07-21 DIAGNOSIS — B351 Tinea unguium: Secondary | ICD-10-CM | POA: Diagnosis not present

## 2017-07-21 NOTE — Progress Notes (Signed)
Complaint:  Visit Type: Patient returns to my office for continued preventative foot care services. Complaint: Patient states" my nails have grown long and thick and become painful to walk and wear shoes"  The patient presents for preventative foot care services. No changes to ROS  Podiatric Exam: Vascular: dorsalis pedis and posterior tibial pulses are palpable bilateral. Capillary return is immediate. Temperature gradient is WNL. Skin turgor WNL  Sensorium: Normal Semmes Weinstein monofilament test. Normal tactile sensation bilaterally. Nail Exam: Pt has thick disfigured discolored nails with subungual debris noted bilateral entire nail hallux through fifth toenails Ulcer Exam: There is no evidence of ulcer or pre-ulcerative changes or infection. Orthopedic Exam: Muscle tone and strength are WNL. No limitations in general ROM. No crepitus or effusions noted. Foot type and digits show no abnormalities. Bony prominences are unremarkable. Skin: No Porokeratosis. No infection or ulcers  Diagnosis:  Onychomycosis, , Pain in right toe, pain in left toes  Treatment & Plan Procedures and Treatment: Consent by patient was obtained for treatment procedures.   Debridement of mycotic and hypertrophic toenails, 1 through 5 bilateral and clearing of subungual debris. No ulceration, no infection noted.  Return Visit-Office Procedure: Patient instructed to return to the office for a follow up visit 11 weeks  for continued evaluation and treatment.    Gardiner Barefoot DPM

## 2017-07-28 ENCOUNTER — Ambulatory Visit: Payer: Medicare Other | Admitting: Podiatry

## 2017-10-13 ENCOUNTER — Ambulatory Visit (INDEPENDENT_AMBULATORY_CARE_PROVIDER_SITE_OTHER): Payer: Medicare Other | Admitting: Podiatry

## 2017-10-13 ENCOUNTER — Encounter: Payer: Self-pay | Admitting: Podiatry

## 2017-10-13 DIAGNOSIS — M79609 Pain in unspecified limb: Secondary | ICD-10-CM | POA: Diagnosis not present

## 2017-10-13 DIAGNOSIS — B351 Tinea unguium: Secondary | ICD-10-CM | POA: Diagnosis not present

## 2017-10-13 NOTE — Progress Notes (Signed)
Complaint:  Visit Type: Patient returns to my office for continued preventative foot care services. Complaint: Patient states" my nails have grown long and thick and become painful to walk and wear shoes"  The patient presents for preventative foot care services. No changes to ROS.Patient has been diagnosed with PVD and neuropathy.  Podiatric Exam: Vascular: dorsalis pedis and posterior tibial pulses are palpable bilateral. Capillary return is immediate. Temperature gradient is WNL. Skin turgor WNL  Sensorium: Normal Semmes Weinstein monofilament test. Normal tactile sensation bilaterally. Nail Exam: Pt has thick disfigured discolored nails with subungual debris noted bilateral entire nail hallux through fifth toenails Ulcer Exam: There is no evidence of ulcer or pre-ulcerative changes or infection. Orthopedic Exam: Muscle tone and strength are WNL. No limitations in general ROM. No crepitus or effusions noted. Foot type and digits show no abnormalities. Bony prominences are unremarkable. Skin: No Porokeratosis. No infection or ulcers  Diagnosis:  Onychomycosis, , Pain in right toe, pain in left toes  Treatment & Plan Procedures and Treatment: Consent by patient was obtained for treatment procedures.   Debridement of mycotic and hypertrophic toenails, 1 through 5 bilateral and clearing of subungual debris. No ulceration, no infection noted.  Return Visit-Office Procedure: Patient instructed to return to the office for a follow up visit 11 weeks  for continued evaluation and treatment.    Gardiner Barefoot DPM

## 2017-11-11 ENCOUNTER — Telehealth: Payer: Self-pay | Admitting: *Deleted

## 2017-11-11 NOTE — Telephone Encounter (Signed)
Copied from [place] [id number]. Topic: General - Other >> Nov 11, 2017  8:58 AM Yvette Rack wrote: Reason for CRM: Sharyn Lull from [facility] calling stating that Kyle Gutierrez had called her to get a fax number from her and that Sharyn Lull from [facility] wanted Office Noted on pt for last 12 months documented that patient to continue CPAP therapy   (P) [phone] (F) [phone]

## 2017-11-13 NOTE — Telephone Encounter (Signed)
Sharyn Lull made aware not a current patient of Dr Derrel Nip, last seen 2015

## 2017-12-17 ENCOUNTER — Other Ambulatory Visit: Payer: Self-pay | Admitting: Family Medicine

## 2017-12-17 DIAGNOSIS — M5416 Radiculopathy, lumbar region: Secondary | ICD-10-CM

## 2017-12-22 ENCOUNTER — Ambulatory Visit: Payer: Medicare Other | Admitting: Podiatry

## 2017-12-24 ENCOUNTER — Ambulatory Visit
Admission: RE | Admit: 2017-12-24 | Discharge: 2017-12-24 | Disposition: A | Discharge status: Home / Self Care | Payer: Medicare Other | Source: Ambulatory Visit | Attending: Family Medicine | Admitting: Family Medicine

## 2017-12-24 DIAGNOSIS — M5416 Radiculopathy, lumbar region: Secondary | ICD-10-CM

## 2017-12-24 DIAGNOSIS — M8938 Hypertrophy of bone, other site: Secondary | ICD-10-CM | POA: Insufficient documentation

## 2017-12-24 DIAGNOSIS — M48061 Spinal stenosis, lumbar region without neurogenic claudication: Secondary | ICD-10-CM | POA: Diagnosis not present

## 2017-12-24 DIAGNOSIS — M5116 Intervertebral disc disorders with radiculopathy, lumbar region: Secondary | ICD-10-CM | POA: Diagnosis not present

## 2017-12-25 ENCOUNTER — Ambulatory Visit (INDEPENDENT_AMBULATORY_CARE_PROVIDER_SITE_OTHER): Payer: Medicare Other | Admitting: Podiatry

## 2017-12-25 ENCOUNTER — Encounter: Payer: Self-pay | Admitting: Podiatry

## 2017-12-25 DIAGNOSIS — M79609 Pain in unspecified limb: Principal | ICD-10-CM

## 2017-12-25 DIAGNOSIS — M79676 Pain in unspecified toe(s): Secondary | ICD-10-CM | POA: Diagnosis not present

## 2017-12-25 DIAGNOSIS — B351 Tinea unguium: Secondary | ICD-10-CM | POA: Diagnosis not present

## 2017-12-25 NOTE — Progress Notes (Addendum)
Complaint:  Visit Type: Patient returns to my office for continued preventative foot care services. Complaint: Patient states" my nails have grown long and thick and become painful to walk and wear shoes"  The patient presents for preventative foot care services. No changes to ROS.Patient has been diagnosed with PVD and neuropathy.  Podiatric Exam: Vascular: dorsalis pedis and posterior tibial pulses are palpable bilateral. Capillary return is immediate. Temperature gradient is WNL. Skin turgor WNL  Sensorium: Normal Semmes Weinstein monofilament test. Normal tactile sensation bilaterally. Nail Exam: Pt has thick disfigured discolored nails with subungual debris noted bilateral entire nail hallux through fifth toenails Ulcer Exam: There is no evidence of ulcer or pre-ulcerative changes or infection. Orthopedic Exam: Muscle tone and strength are WNL. No limitations in general ROM. No crepitus or effusions noted. Foot type and digits show no abnormalities. Bony prominences are unremarkable. Skin: No Porokeratosis. No infection or ulcers  Diagnosis:  Onychomycosis, , Pain in right toe, pain in left toes  Treatment & Plan Procedures and Treatment: Consent by patient was obtained for treatment procedures.   Debridement of mycotic and hypertrophic toenails, 1 through 5 bilateral and clearing of subungual debris. No ulceration, no infection noted. ABN signed for 2019. Return Visit-Office Procedure: Patient instructed to return to the office for a follow up visit 10 weeks  for continued evaluation and treatment.    Gardiner Barefoot DPM

## 2018-01-12 ENCOUNTER — Ambulatory Visit: Payer: Medicare Other | Admitting: Podiatry

## 2018-03-16 ENCOUNTER — Ambulatory Visit (INDEPENDENT_AMBULATORY_CARE_PROVIDER_SITE_OTHER): Payer: Medicare Other | Admitting: Podiatry

## 2018-03-16 ENCOUNTER — Encounter: Payer: Self-pay | Admitting: Podiatry

## 2018-03-16 DIAGNOSIS — B351 Tinea unguium: Secondary | ICD-10-CM | POA: Diagnosis not present

## 2018-03-16 DIAGNOSIS — M79609 Pain in unspecified limb: Secondary | ICD-10-CM

## 2018-03-16 NOTE — Progress Notes (Signed)
Complaint:  Visit Type: Patient returns to my office for continued preventative foot care services. Complaint: Patient states" my nails have grown long and thick and become painful to walk and wear shoes"  The patient presents for preventative foot care services. No changes to ROS  Podiatric Exam: Vascular: dorsalis pedis and posterior tibial pulses are palpable bilateral. Capillary return is immediate. Temperature gradient is WNL. Skin turgor WNL  Sensorium: Normal Semmes Weinstein monofilament test. Normal tactile sensation bilaterally. Nail Exam: Pt has thick disfigured discolored nails with subungual debris noted bilateral entire nail hallux through fifth toenails Ulcer Exam: There is no evidence of ulcer or pre-ulcerative changes or infection. Orthopedic Exam: Muscle tone and strength are WNL. No limitations in general ROM. No crepitus or effusions noted. Foot type and digits show no abnormalities. Bony prominences are unremarkable. Skin: No Porokeratosis. No infection or ulcers  Diagnosis:  Onychomycosis, , Pain in right toe, pain in left toes  Treatment & Plan Procedures and Treatment: Consent by patient was obtained for treatment procedures.   Debridement of mycotic and hypertrophic toenails, 1 through 5 bilateral and clearing of subungual debris. No ulceration, no infection noted.  Return Visit-Office Procedure: Patient instructed to return to the office for a follow up visit 10 weeks  for continued evaluation and treatment.    Gardiner Barefoot DPM

## 2018-03-24 DIAGNOSIS — M19019 Primary osteoarthritis, unspecified shoulder: Secondary | ICD-10-CM | POA: Insufficient documentation

## 2018-06-10 ENCOUNTER — Other Ambulatory Visit: Payer: Self-pay | Admitting: Orthopedic Surgery

## 2018-06-11 ENCOUNTER — Ambulatory Visit: Payer: Medicare Other | Admitting: Podiatry

## 2018-06-26 ENCOUNTER — Encounter
Admission: RE | Admit: 2018-06-26 | Discharge: 2018-06-26 | Disposition: A | Discharge status: Home / Self Care | Payer: Medicare Other | Source: Ambulatory Visit | Attending: Orthopedic Surgery | Admitting: Orthopedic Surgery

## 2018-06-26 ENCOUNTER — Other Ambulatory Visit: Payer: Self-pay

## 2018-06-26 DIAGNOSIS — Z01818 Encounter for other preprocedural examination: Secondary | ICD-10-CM | POA: Insufficient documentation

## 2018-06-26 DIAGNOSIS — I10 Essential (primary) hypertension: Secondary | ICD-10-CM | POA: Insufficient documentation

## 2018-06-26 HISTORY — DX: Hyperlipidemia, unspecified: E78.5

## 2018-06-26 HISTORY — DX: Other abnormalities of gait and mobility: R26.89

## 2018-06-26 HISTORY — DX: Adverse effect of unspecified anesthetic, initial encounter: T41.45XA

## 2018-06-26 HISTORY — DX: Cardiac arrhythmia, unspecified: I49.9

## 2018-06-26 HISTORY — DX: Other complications of anesthesia, initial encounter: T88.59XA

## 2018-06-26 HISTORY — DX: Cardiomyopathy, unspecified: I42.9

## 2018-06-26 HISTORY — DX: Dyspnea, unspecified: R06.00

## 2018-06-26 LAB — CBC WITH DIFFERENTIAL/PLATELET
ABS IMMATURE GRANULOCYTES: 0.02 10*3/uL (ref 0.00–0.07)
Basophils Absolute: 0 10*3/uL (ref 0.0–0.1)
Basophils Relative: 0 %
Eosinophils Absolute: 0.2 10*3/uL (ref 0.0–0.5)
Eosinophils Relative: 2 %
HEMATOCRIT: 42.2 % (ref 39.0–52.0)
HEMOGLOBIN: 13 g/dL (ref 13.0–17.0)
Immature Granulocytes: 0 %
LYMPHS ABS: 1.1 10*3/uL (ref 0.7–4.0)
LYMPHS PCT: 16 %
MCH: 30.9 pg (ref 26.0–34.0)
MCHC: 30.8 g/dL (ref 30.0–36.0)
MCV: 100.2 fL — ABNORMAL HIGH (ref 80.0–100.0)
MONO ABS: 0.4 10*3/uL (ref 0.1–1.0)
MONOS PCT: 6 %
NEUTROS ABS: 5.4 10*3/uL (ref 1.7–7.7)
Neutrophils Relative %: 76 %
Platelets: 122 10*3/uL — ABNORMAL LOW (ref 150–400)
RBC: 4.21 MIL/uL — ABNORMAL LOW (ref 4.22–5.81)
RDW: 13.3 % (ref 11.5–15.5)
WBC: 7.1 10*3/uL (ref 4.0–10.5)
nRBC: 0 % (ref 0.0–0.2)

## 2018-06-26 LAB — BASIC METABOLIC PANEL
ANION GAP: 5 (ref 5–15)
BUN: 31 mg/dL — AB (ref 8–23)
CO2: 29 mmol/L (ref 22–32)
CREATININE: 1.14 mg/dL (ref 0.61–1.24)
Calcium: 9.2 mg/dL (ref 8.9–10.3)
Chloride: 108 mmol/L (ref 98–111)
GFR calc Af Amer: >60 mL/min (ref >60)
GFR calc non Af Amer: 57 mL/min — ABNORMAL LOW (ref >60)
GLUCOSE: 107 mg/dL — AB (ref 70–99)
POTASSIUM: 4.2 mmol/L (ref 3.5–5.1)
Sodium: 142 mmol/L (ref 135–145)

## 2018-06-26 LAB — PROTIME-INR
INR: 1.82
Prothrombin Time: 20.8 seconds — ABNORMAL HIGH (ref 11.4–15.2)

## 2018-06-26 LAB — APTT: aPTT: 35 seconds (ref 24–36)

## 2018-06-26 MED ORDER — CHLORHEXIDINE GLUCONATE CLOTH 2 % EX PADS
6.0000 | MEDICATED_PAD | Freq: Once | CUTANEOUS | Status: DC
  Filled 2018-06-26: qty 6

## 2018-06-26 NOTE — Pre-Procedure Instructions (Signed)
Cardiac clearance by Dr Nehemiah Massed on chart.

## 2018-06-26 NOTE — Patient Instructions (Signed)
Your procedure is scheduled on: 07/02/18 Thur Report to Same Day Surgery 2nd floor medical mall [facility] Entrance-take elevator on left to 2nd floor.  Check in with surgery information desk.) To find out your arrival time please call [phone] between 1PM - 3PM on 07/01/18 Wed  Remember: Instructions that are not followed completely may result in serious medical risk, up to and including death, or upon the discretion of your surgeon and anesthesiologist your surgery may need to be rescheduled.    _x___ 1. Do not eat food after midnight the night before your procedure. You may drink clear liquids up to 2 hours before you are scheduled to arrive at the hospital for your procedure.  Do not drink clear liquids within 2 hours of your scheduled arrival to the hospital.  Clear liquids include  --Water or Apple juice without pulp  --Clear carbohydrate beverage such as ClearFast or Gatorade  --Black Coffee or Clear Tea (No milk, no creamers, do not add anything to                  the coffee or Tea Type 1 and type 2 diabetics should only drink water.   ____Ensure clear carbohydrate drink on the way to the hospital for bariatric patients  ____Ensure clear carbohydrate drink 3 hours before surgery for Dr Dwyane Luo patients if physician instructed.   No gum chewing or hard candies.     __x__ 2. No Alcohol for 24 hours before or after surgery.   __x__3. No Smoking or e-cigarettes for 24 prior to surgery.  Do not use any chewable tobacco products for at least 6 hour prior to surgery   ____  4. Bring all medications with you on the day of surgery if instructed.    __x__ 5. Notify your doctor if there is any change in your medical condition     (cold, fever, infections).    x___6. On the morning of surgery brush your teeth with toothpaste and water.  You may rinse your mouth with mouth wash if you wish.  Do not swallow any toothpaste or mouthwash.   Do not wear jewelry, make-up, hairpins,  clips or nail polish.  Do not wear lotions, powders, or perfumes. You may wear deodorant.  Do not shave 48 hours prior to surgery. Men may shave face and neck.  Do not bring valuables to the hospital.    [facility] is not responsible for any belongings or valuables.               Contacts, dentures or bridgework may not be worn into surgery.  Leave your suitcase in the car. After surgery it may be brought to your room.  For patients admitted to the hospital, discharge time is determined by your                       treatment team.  _  Patients discharged the day of surgery will not be allowed to drive home.  You will need someone to drive you home and stay with you the night of your procedure.    Please read over the following fact sheets that you were given:   [facility] Preparing for Surgery and or MRSA Information   _x___ Take anti-hypertensive listed below, cardiac, seizure, asthma,     anti-reflux and psychiatric medicines. These include:  1. metoprolol succinate (TOPROL-XL) 25 MG 24 hr tablet  2.  3.  4.  5.  6.  ____Fleets enema or Magnesium Citrate as directed.   _x___ Use CHG Soap or sage wipes as directed on instruction sheet   ____ Use inhalers on the day of surgery and bring to hospital day of surgery  ____ Stop Metformin and Janumet 2 days prior to surgery.    ____ Take 1/2 of usual insulin dose the night before surgery and none on the morning     surgery.   _x___ Follow recommendations from Cardiologist, Pulmonologist or PCP regarding          stopping Aspirin, Coumadin, Plavix ,Eliquis, Effient, or Pradaxa, and Pletal. Stop Warfarin 3 days before surgery per Dr Nehemiah Massed and 2 days after surgery  X____Stop Anti-inflammatories such as Advil, Aleve, Ibuprofen, Motrin, Naproxen, Naprosyn, Goodies powders or aspirin products. OK to take Tylenol and                          Celebrex.   _x___ Stop supplements until after surgery.  But may continue Vitamin D, Vitamin  B,       and multivitamin.   _x___ Bring BPAP to the hospital.

## 2018-06-29 ENCOUNTER — Encounter: Payer: Self-pay | Admitting: Podiatry

## 2018-06-29 ENCOUNTER — Ambulatory Visit (INDEPENDENT_AMBULATORY_CARE_PROVIDER_SITE_OTHER): Payer: Medicare Other | Admitting: Podiatry

## 2018-06-29 DIAGNOSIS — D689 Coagulation defect, unspecified: Secondary | ICD-10-CM

## 2018-06-29 DIAGNOSIS — B351 Tinea unguium: Secondary | ICD-10-CM | POA: Diagnosis not present

## 2018-06-29 DIAGNOSIS — M79609 Pain in unspecified limb: Principal | ICD-10-CM

## 2018-06-29 DIAGNOSIS — G629 Polyneuropathy, unspecified: Secondary | ICD-10-CM

## 2018-06-29 DIAGNOSIS — M79676 Pain in unspecified toe(s): Secondary | ICD-10-CM | POA: Diagnosis not present

## 2018-06-29 NOTE — Progress Notes (Addendum)
Complaint:  Visit Type: Patient returns to my office for continued preventative foot care services. Complaint: Patient states" my nails have grown long and thick and become painful to walk and wear shoes"  The patient presents for preventative foot care services. No changes to ROS  Podiatric Exam: Vascular: dorsalis pedis and posterior tibial pulses are palpable bilateral. Capillary return is immediate. Temperature gradient is WNL. Skin turgor WNL  Sensorium: Normal Semmes Weinstein monofilament test. Normal tactile sensation bilaterally. Nail Exam: Pt has thick disfigured discolored nails with subungual debris noted bilateral entire nail hallux through fifth toenails Ulcer Exam: There is no evidence of ulcer or pre-ulcerative changes or infection. Orthopedic Exam: Muscle tone and strength are WNL. No limitations in general ROM. No crepitus or effusions noted. Pes planus  . Bony prominences are unremarkable. Skin: No Porokeratosis. No infection or ulcers  Diagnosis:  Onychomycosis, , Pain in right toe, pain in left toes  Treatment & Plan Procedures and Treatment: Consent by patient was obtained for treatment procedures.   Debridement of mycotic and hypertrophic toenails, 1 through 5 bilateral and clearing of subungual debris. No ulceration, no infection noted.  Return Visit-Office Procedure: Patient instructed to return to the office for a follow up visit 10 weeks  for continued evaluation and treatment.    Gardiner Barefoot DPM

## 2018-07-01 MED ORDER — CEFAZOLIN SODIUM-DEXTROSE 2-4 GM/100ML-% IV SOLN
2.0000 g | INTRAVENOUS | Status: AC
  Administered 2018-07-02: 2 g via INTRAVENOUS

## 2018-07-02 ENCOUNTER — Encounter
Admission: RE | Disposition: A | Discharge status: Skilled Nursing Facility | Payer: Self-pay | Source: Home / Self Care | Attending: Orthopedic Surgery

## 2018-07-02 ENCOUNTER — Inpatient Hospital Stay
Admission: RE | Admit: 2018-07-02 | Discharge: 2018-07-04 | DRG: 511 | Disposition: A | Discharge status: Skilled Nursing Facility | Payer: Medicare Other | Attending: Orthopedic Surgery | Admitting: Orthopedic Surgery

## 2018-07-02 ENCOUNTER — Observation Stay: Payer: Medicare Other

## 2018-07-02 ENCOUNTER — Other Ambulatory Visit: Payer: Self-pay

## 2018-07-02 ENCOUNTER — Ambulatory Visit: Payer: Medicare Other | Admitting: Anesthesiology

## 2018-07-02 DIAGNOSIS — G4730 Sleep apnea, unspecified: Secondary | ICD-10-CM | POA: Diagnosis present

## 2018-07-02 DIAGNOSIS — I9581 Postprocedural hypotension: Secondary | ICD-10-CM | POA: Diagnosis not present

## 2018-07-02 DIAGNOSIS — Z96659 Presence of unspecified artificial knee joint: Secondary | ICD-10-CM

## 2018-07-02 DIAGNOSIS — M66812 Spontaneous rupture of other tendons, left shoulder: Secondary | ICD-10-CM | POA: Diagnosis present

## 2018-07-02 DIAGNOSIS — D696 Thrombocytopenia, unspecified: Secondary | ICD-10-CM | POA: Diagnosis present

## 2018-07-02 DIAGNOSIS — Z7901 Long term (current) use of anticoagulants: Secondary | ICD-10-CM

## 2018-07-02 DIAGNOSIS — Z9889 Other specified postprocedural states: Secondary | ICD-10-CM

## 2018-07-02 DIAGNOSIS — Z87891 Personal history of nicotine dependence: Secondary | ICD-10-CM

## 2018-07-02 DIAGNOSIS — Z905 Acquired absence of kidney: Secondary | ICD-10-CM

## 2018-07-02 DIAGNOSIS — R0902 Hypoxemia: Secondary | ICD-10-CM | POA: Diagnosis not present

## 2018-07-02 DIAGNOSIS — Z85528 Personal history of other malignant neoplasm of kidney: Secondary | ICD-10-CM

## 2018-07-02 DIAGNOSIS — M75122 Complete rotator cuff tear or rupture of left shoulder, not specified as traumatic: Principal | ICD-10-CM | POA: Diagnosis present

## 2018-07-02 DIAGNOSIS — Z96641 Presence of right artificial hip joint: Secondary | ICD-10-CM | POA: Diagnosis present

## 2018-07-02 DIAGNOSIS — I2510 Atherosclerotic heart disease of native coronary artery without angina pectoris: Secondary | ICD-10-CM | POA: Diagnosis present

## 2018-07-02 DIAGNOSIS — I10 Essential (primary) hypertension: Secondary | ICD-10-CM | POA: Diagnosis present

## 2018-07-02 DIAGNOSIS — R0602 Shortness of breath: Secondary | ICD-10-CM

## 2018-07-02 DIAGNOSIS — Z79899 Other long term (current) drug therapy: Secondary | ICD-10-CM

## 2018-07-02 DIAGNOSIS — M19012 Primary osteoarthritis, left shoulder: Secondary | ICD-10-CM | POA: Diagnosis present

## 2018-07-02 DIAGNOSIS — I428 Other cardiomyopathies: Secondary | ICD-10-CM | POA: Diagnosis present

## 2018-07-02 DIAGNOSIS — I482 Chronic atrial fibrillation, unspecified: Secondary | ICD-10-CM | POA: Diagnosis present

## 2018-07-02 DIAGNOSIS — Z9079 Acquired absence of other genital organ(s): Secondary | ICD-10-CM

## 2018-07-02 HISTORY — PX: SHOULDER ARTHROSCOPY WITH OPEN ROTATOR CUFF REPAIR: SHX6092

## 2018-07-02 LAB — PROTIME-INR
INR: 1.23
PROTHROMBIN TIME: 15.4 s — AB (ref 11.4–15.2)

## 2018-07-02 SURGERY — ARTHROSCOPY, SHOULDER WITH REPAIR, ROTATOR CUFF, OPEN
Anesthesia: General | Site: Shoulder | Laterality: Left

## 2018-07-02 MED ORDER — FENTANYL CITRATE (PF) 100 MCG/2ML IJ SOLN
INTRAMUSCULAR | Status: DC | PRN
  Administered 2018-07-02 (×2): 50 ug via INTRAVENOUS

## 2018-07-02 MED ORDER — MENTHOL 3 MG MT LOZG
1.0000 | LOZENGE | OROMUCOSAL | Status: DC | PRN
  Filled 2018-07-02: qty 9

## 2018-07-02 MED ORDER — IPRATROPIUM-ALBUTEROL 0.5-2.5 (3) MG/3ML IN SOLN
3.0000 mL | Freq: Once | RESPIRATORY_TRACT | Status: AC
  Administered 2018-07-02: 3 mL via RESPIRATORY_TRACT

## 2018-07-02 MED ORDER — SODIUM CHLORIDE 0.9 % IV SOLN
INTRAVENOUS | Status: DC
  Administered 2018-07-02: 16:00:00 via INTRAVENOUS

## 2018-07-02 MED ORDER — SPIRONOLACTONE 25 MG PO TABS
25.0000 mg | ORAL_TABLET | Freq: Every day | ORAL | Status: DC
  Administered 2018-07-03 – 2018-07-04 (×2): 25 mg via ORAL
  Filled 2018-07-02 (×2): qty 1

## 2018-07-02 MED ORDER — NEOMYCIN-POLYMYXIN B GU 40-200000 IR SOLN
Status: DC | PRN
  Administered 2018-07-02: 2 mL

## 2018-07-02 MED ORDER — KETOROLAC TROMETHAMINE 15 MG/ML IJ SOLN
7.5000 mg | Freq: Four times a day (QID) | INTRAMUSCULAR | Status: AC
  Administered 2018-07-02 – 2018-07-03 (×4): 7.5 mg via INTRAVENOUS
  Filled 2018-07-02 (×4): qty 1

## 2018-07-02 MED ORDER — DEXAMETHASONE SODIUM PHOSPHATE 10 MG/ML IJ SOLN
INTRAMUSCULAR | Status: AC
  Filled 2018-07-02: qty 1

## 2018-07-02 MED ORDER — MAGNESIUM CITRATE PO SOLN
1.0000 | Freq: Once | ORAL | Status: DC | PRN
  Filled 2018-07-02: qty 296

## 2018-07-02 MED ORDER — ONDANSETRON HCL 4 MG/2ML IJ SOLN
INTRAMUSCULAR | Status: AC
  Filled 2018-07-02: qty 2

## 2018-07-02 MED ORDER — ACETAMINOPHEN 10 MG/ML IV SOLN
INTRAVENOUS | Status: DC | PRN
  Administered 2018-07-02: 1000 mg via INTRAVENOUS

## 2018-07-02 MED ORDER — EPINEPHRINE 30 MG/30ML IJ SOLN
INTRAMUSCULAR | Status: AC
  Filled 2018-07-02: qty 1

## 2018-07-02 MED ORDER — FENTANYL CITRATE (PF) 100 MCG/2ML IJ SOLN
25.0000 ug | INTRAMUSCULAR | Status: AC | PRN
  Administered 2018-07-02 (×6): 25 ug via INTRAVENOUS

## 2018-07-02 MED ORDER — EPINEPHRINE PF 1 MG/ML IJ SOLN
INTRAMUSCULAR | Status: DC | PRN
  Administered 2018-07-02: 30 mL

## 2018-07-02 MED ORDER — FENTANYL CITRATE (PF) 100 MCG/2ML IJ SOLN
INTRAMUSCULAR | Status: AC
  Administered 2018-07-02: 25 ug via INTRAVENOUS
  Filled 2018-07-02: qty 2

## 2018-07-02 MED ORDER — SUCCINYLCHOLINE CHLORIDE 20 MG/ML IJ SOLN
INTRAMUSCULAR | Status: AC
  Filled 2018-07-02: qty 1

## 2018-07-02 MED ORDER — ROCURONIUM BROMIDE 50 MG/5ML IV SOLN
INTRAVENOUS | Status: AC
  Filled 2018-07-02: qty 1

## 2018-07-02 MED ORDER — BUPIVACAINE HCL (PF) 0.25 % IJ SOLN
INTRAMUSCULAR | Status: DC | PRN
  Administered 2018-07-02: 30 mL

## 2018-07-02 MED ORDER — SUGAMMADEX SODIUM 200 MG/2ML IV SOLN
INTRAVENOUS | Status: DC | PRN
  Administered 2018-07-02: 200 mg via INTRAVENOUS

## 2018-07-02 MED ORDER — ONDANSETRON HCL 4 MG PO TABS: 4.0000 mg | ORAL_TABLET | Freq: Four times a day (QID) | ORAL | Status: DC | PRN

## 2018-07-02 MED ORDER — BUPIVACAINE HCL (PF) 0.25 % IJ SOLN
INTRAMUSCULAR | Status: AC
  Filled 2018-07-02: qty 30

## 2018-07-02 MED ORDER — SODIUM CHLORIDE 0.9 % IV SOLN
INTRAVENOUS | Status: DC | PRN
  Administered 2018-07-02: 40 ug/min via INTRAVENOUS

## 2018-07-02 MED ORDER — FAMOTIDINE 20 MG PO TABS
ORAL_TABLET | ORAL | Status: AC
  Administered 2018-07-02: 20 mg via ORAL
  Filled 2018-07-02: qty 1

## 2018-07-02 MED ORDER — WARFARIN SODIUM 3 MG PO TABS: 3.0000 mg | ORAL_TABLET | ORAL | Status: DC

## 2018-07-02 MED ORDER — ACETAMINOPHEN 325 MG PO TABS: 325.0000 mg | ORAL_TABLET | Freq: Four times a day (QID) | ORAL | Status: DC | PRN

## 2018-07-02 MED ORDER — HYDROMORPHONE HCL 1 MG/ML IJ SOLN
INTRAMUSCULAR | Status: DC | PRN
  Administered 2018-07-02 (×2): 0.5 mg via INTRAVENOUS

## 2018-07-02 MED ORDER — OXYCODONE HCL 5 MG PO TABS: 5.0000 mg | ORAL_TABLET | ORAL | 0 refills | Status: DC | PRN

## 2018-07-02 MED ORDER — HYDROMORPHONE HCL 1 MG/ML IJ SOLN
INTRAMUSCULAR | Status: AC
  Filled 2018-07-02: qty 1

## 2018-07-02 MED ORDER — BISACODYL 10 MG RE SUPP: 10.0000 mg | Freq: Every day | RECTAL | Status: DC | PRN

## 2018-07-02 MED ORDER — ONDANSETRON HCL 4 MG/2ML IJ SOLN
4.0000 mg | Freq: Four times a day (QID) | INTRAMUSCULAR | Status: DC | PRN
  Administered 2018-07-04: 4 mg via INTRAVENOUS
  Filled 2018-07-02: qty 2

## 2018-07-02 MED ORDER — WARFARIN SODIUM 5 MG PO TABS
5.0000 mg | ORAL_TABLET | Freq: Once | ORAL | Status: AC
  Administered 2018-07-02: 5 mg via ORAL
  Filled 2018-07-02: qty 1

## 2018-07-02 MED ORDER — ROCURONIUM BROMIDE 100 MG/10ML IV SOLN
INTRAVENOUS | Status: DC | PRN
  Administered 2018-07-02: 50 mg via INTRAVENOUS

## 2018-07-02 MED ORDER — LIDOCAINE HCL (PF) 2 % IJ SOLN
INTRAMUSCULAR | Status: AC
  Filled 2018-07-02: qty 10

## 2018-07-02 MED ORDER — HYDROCODONE-ACETAMINOPHEN 5-325 MG PO TABS
1.0000 | ORAL_TABLET | ORAL | Status: DC | PRN
  Administered 2018-07-02: 2 via ORAL
  Administered 2018-07-02: 1 via ORAL
  Administered 2018-07-03: 2 via ORAL
  Administered 2018-07-03 (×3): 1 via ORAL
  Administered 2018-07-03: 2 via ORAL
  Administered 2018-07-04: 1 via ORAL
  Administered 2018-07-04: 2 via ORAL
  Filled 2018-07-02: qty 2
  Filled 2018-07-02: qty 1
  Filled 2018-07-02 (×2): qty 2
  Filled 2018-07-02 (×6): qty 1
  Filled 2018-07-02: qty 2

## 2018-07-02 MED ORDER — CEFAZOLIN SODIUM-DEXTROSE 1-4 GM/50ML-% IV SOLN
1.0000 g | Freq: Four times a day (QID) | INTRAVENOUS | Status: AC
  Administered 2018-07-02 – 2018-07-03 (×3): 1 g via INTRAVENOUS
  Filled 2018-07-02 (×3): qty 50

## 2018-07-02 MED ORDER — ONDANSETRON HCL 4 MG/2ML IJ SOLN
INTRAMUSCULAR | Status: DC | PRN
  Administered 2018-07-02: 4 mg via INTRAVENOUS

## 2018-07-02 MED ORDER — LIDOCAINE HCL 1 % IJ SOLN
INTRAMUSCULAR | Status: DC | PRN
  Administered 2018-07-02: 5 mL

## 2018-07-02 MED ORDER — FAMOTIDINE 20 MG PO TABS
20.0000 mg | ORAL_TABLET | Freq: Once | ORAL | Status: AC
  Administered 2018-07-02: 20 mg via ORAL

## 2018-07-02 MED ORDER — METHOCARBAMOL 1000 MG/10ML IJ SOLN
500.0000 mg | Freq: Four times a day (QID) | INTRAVENOUS | Status: DC | PRN
  Filled 2018-07-02: qty 5

## 2018-07-02 MED ORDER — ACETAMINOPHEN 500 MG PO TABS
500.0000 mg | ORAL_TABLET | Freq: Four times a day (QID) | ORAL | Status: AC
  Administered 2018-07-02 – 2018-07-03 (×3): 500 mg via ORAL
  Filled 2018-07-02 (×4): qty 1

## 2018-07-02 MED ORDER — PHENYLEPHRINE HCL 10 MG/ML IJ SOLN
INTRAMUSCULAR | Status: DC | PRN
  Administered 2018-07-02 (×5): 100 ug via INTRAVENOUS

## 2018-07-02 MED ORDER — SEVOFLURANE IN SOLN
RESPIRATORY_TRACT | Status: AC
  Filled 2018-07-02: qty 250

## 2018-07-02 MED ORDER — NEOMYCIN-POLYMYXIN B GU 40-200000 IR SOLN
Status: AC
  Filled 2018-07-02: qty 20

## 2018-07-02 MED ORDER — OCUVITE-LUTEIN PO CAPS
ORAL_CAPSULE | Freq: Two times a day (BID) | ORAL | Status: DC
  Administered 2018-07-02 – 2018-07-04 (×2): 1 via ORAL
  Filled 2018-07-02 (×5): qty 1

## 2018-07-02 MED ORDER — SUGAMMADEX SODIUM 200 MG/2ML IV SOLN
INTRAVENOUS | Status: AC
  Filled 2018-07-02: qty 2

## 2018-07-02 MED ORDER — POLYETHYLENE GLYCOL 3350 17 G PO PACK: 17.0000 g | PACK | Freq: Every day | ORAL | Status: DC | PRN

## 2018-07-02 MED ORDER — DOCUSATE SODIUM 100 MG PO CAPS
100.0000 mg | ORAL_CAPSULE | Freq: Two times a day (BID) | ORAL | Status: DC
  Administered 2018-07-02 – 2018-07-04 (×4): 100 mg via ORAL
  Filled 2018-07-02 (×4): qty 1

## 2018-07-02 MED ORDER — WARFARIN - PHARMACIST DOSING INPATIENT: Freq: Every day | Status: DC

## 2018-07-02 MED ORDER — DEXAMETHASONE SODIUM PHOSPHATE 10 MG/ML IJ SOLN
INTRAMUSCULAR | Status: DC | PRN
  Administered 2018-07-02: 5 mg via INTRAVENOUS

## 2018-07-02 MED ORDER — ALUM & MAG HYDROXIDE-SIMETH 200-200-20 MG/5ML PO SUSP: 30.0000 mL | ORAL | Status: DC | PRN

## 2018-07-02 MED ORDER — PROPOFOL 10 MG/ML IV BOLUS
INTRAVENOUS | Status: DC | PRN
  Administered 2018-07-02: 100 mg via INTRAVENOUS

## 2018-07-02 MED ORDER — PROPOFOL 10 MG/ML IV BOLUS
INTRAVENOUS | Status: AC
  Filled 2018-07-02: qty 20

## 2018-07-02 MED ORDER — METOPROLOL SUCCINATE ER 25 MG PO TB24
12.5000 mg | ORAL_TABLET | Freq: Every day | ORAL | Status: DC
  Administered 2018-07-03 – 2018-07-04 (×2): 12.5 mg via ORAL
  Filled 2018-07-02 (×2): qty 1

## 2018-07-02 MED ORDER — SIMVASTATIN 20 MG PO TABS
40.0000 mg | ORAL_TABLET | Freq: Every morning | ORAL | Status: DC
  Administered 2018-07-03 – 2018-07-04 (×2): 40 mg via ORAL
  Filled 2018-07-02: qty 2
  Filled 2018-07-02: qty 1

## 2018-07-02 MED ORDER — CEFAZOLIN SODIUM-DEXTROSE 2-4 GM/100ML-% IV SOLN
INTRAVENOUS | Status: AC
  Filled 2018-07-02: qty 100

## 2018-07-02 MED ORDER — PHENOL 1.4 % MT LIQD
1.0000 | OROMUCOSAL | Status: DC | PRN
  Filled 2018-07-02: qty 177

## 2018-07-02 MED ORDER — TRAMADOL HCL 50 MG PO TABS
50.0000 mg | ORAL_TABLET | Freq: Four times a day (QID) | ORAL | Status: DC
  Administered 2018-07-02 – 2018-07-04 (×7): 50 mg via ORAL
  Filled 2018-07-02 (×6): qty 1

## 2018-07-02 MED ORDER — PHENYLEPHRINE HCL 10 MG/ML IJ SOLN
INTRAMUSCULAR | Status: AC
  Filled 2018-07-02: qty 1

## 2018-07-02 MED ORDER — ALBUTEROL SULFATE (2.5 MG/3ML) 0.083% IN NEBU: 2.5000 mg | INHALATION_SOLUTION | Freq: Four times a day (QID) | RESPIRATORY_TRACT | Status: DC | PRN

## 2018-07-02 MED ORDER — LIDOCAINE HCL (CARDIAC) PF 100 MG/5ML IV SOSY
PREFILLED_SYRINGE | INTRAVENOUS | Status: DC | PRN
  Administered 2018-07-02: 80 mg via INTRAVENOUS

## 2018-07-02 MED ORDER — METHOCARBAMOL 500 MG PO TABS
500.0000 mg | ORAL_TABLET | Freq: Four times a day (QID) | ORAL | Status: DC | PRN
  Administered 2018-07-02 – 2018-07-03 (×3): 500 mg via ORAL
  Filled 2018-07-02 (×3): qty 1

## 2018-07-02 MED ORDER — FENTANYL CITRATE (PF) 100 MCG/2ML IJ SOLN
INTRAMUSCULAR | Status: AC
  Filled 2018-07-02: qty 2

## 2018-07-02 MED ORDER — ONDANSETRON HCL 4 MG/2ML IJ SOLN: 4.0000 mg | Freq: Once | INTRAMUSCULAR | Status: DC | PRN

## 2018-07-02 MED ORDER — MORPHINE SULFATE (PF) 4 MG/ML IV SOLN
0.5000 mg | INTRAVENOUS | Status: DC | PRN
  Administered 2018-07-03: 1 mg via INTRAVENOUS
  Filled 2018-07-02: qty 1

## 2018-07-02 MED ORDER — LIDOCAINE HCL (PF) 1 % IJ SOLN
INTRAMUSCULAR | Status: AC
  Filled 2018-07-02: qty 30

## 2018-07-02 MED ORDER — LACTATED RINGERS IV SOLN
INTRAVENOUS | Status: DC
  Administered 2018-07-02: 08:00:00 via INTRAVENOUS

## 2018-07-02 MED ORDER — ONDANSETRON HCL 4 MG PO TABS: 4.0000 mg | ORAL_TABLET | Freq: Three times a day (TID) | ORAL | 0 refills | Status: DC | PRN

## 2018-07-02 MED ORDER — IPRATROPIUM-ALBUTEROL 0.5-2.5 (3) MG/3ML IN SOLN
RESPIRATORY_TRACT | Status: AC
  Administered 2018-07-02: 3 mL via RESPIRATORY_TRACT
  Filled 2018-07-02: qty 3

## 2018-07-02 SURGICAL SUPPLY — 71 items
ADAPTER IRRIG TUBE 2 SPIKE SOL (ADAPTER) ×4 IMPLANT
ANCHOR ALL-SUT Q-FIX 2.8 (Anchor) ×4 IMPLANT
ANCHOR SUT 5.5 MULTIFIX (Orthopedic Implant) ×6 IMPLANT
BUR RADIUS 4.0X18.5 (BURR) ×2 IMPLANT
BUR RADIUS 5.5 (BURR) ×2 IMPLANT
CANNULA 5.75X7 CRYSTAL CLEAR (CANNULA) ×4 IMPLANT
CANNULA PARTIAL THREAD 2X7 (CANNULA) ×2 IMPLANT
CANNULA TWIST IN 8.25X9CM (CANNULA) IMPLANT
CONNECTOR PERFECT PASSER (CONNECTOR) ×2 IMPLANT
COOLER POLAR GLACIER W/PUMP (MISCELLANEOUS) ×2 IMPLANT
COVER WAND RF STERILE (DRAPES) ×2 IMPLANT
CRADLE LAMINECT ARM (MISCELLANEOUS) ×2 IMPLANT
DEVICE SUCT BLK HOLE OR FLOOR (MISCELLANEOUS) IMPLANT
DRAPE IMP U-DRAPE 54X76 (DRAPES) ×4 IMPLANT
DRAPE INCISE IOBAN 66X45 STRL (DRAPES) ×2 IMPLANT
DRAPE SHEET LG 3/4 BI-LAMINATE (DRAPES) ×2 IMPLANT
DRAPE U-SHAPE 47X51 STRL (DRAPES) IMPLANT
DRSG TEGADERM 8X12 (GAUZE/BANDAGES/DRESSINGS) ×4 IMPLANT
DURAPREP 26ML APPLICATOR (WOUND CARE) ×6 IMPLANT
ELECT REM PT RETURN 9FT ADLT (ELECTROSURGICAL) ×2
ELECTRODE REM PT RTRN 9FT ADLT (ELECTROSURGICAL) ×1 IMPLANT
GAUZE PETRO XEROFOAM 1X8 (MISCELLANEOUS) ×4 IMPLANT
GAUZE SPONGE 4X4 12PLY STRL (GAUZE/BANDAGES/DRESSINGS) ×4 IMPLANT
GLOVE BIOGEL PI IND STRL 9 (GLOVE) ×1 IMPLANT
GLOVE BIOGEL PI INDICATOR 9 (GLOVE) ×1
GLOVE SURG 9.0 ORTHO LTXF (GLOVE) ×4 IMPLANT
GOWN STRL REUS TWL 2XL XL LVL4 (GOWN DISPOSABLE) ×2 IMPLANT
GOWN STRL REUS W/ TWL LRG LVL3 (GOWN DISPOSABLE) ×1 IMPLANT
GOWN STRL REUS W/ TWL LRG LVL4 (GOWN DISPOSABLE) ×1 IMPLANT
GOWN STRL REUS W/TWL LRG LVL3 (GOWN DISPOSABLE) ×1
GOWN STRL REUS W/TWL LRG LVL4 (GOWN DISPOSABLE) ×1
IV LACTATED RINGER IRRG 3000ML (IV SOLUTION) ×30
IV LR IRRIG 3000ML ARTHROMATIC (IV SOLUTION) ×30 IMPLANT
KIT STABILIZATION SHOULDER (MISCELLANEOUS) ×2 IMPLANT
KIT SUTURE 2.8 Q-FIX DISP (MISCELLANEOUS) ×2 IMPLANT
KIT SUTURETAK 3.0 INSERT PERC (KITS) IMPLANT
KIT TURNOVER KIT A (KITS) ×2 IMPLANT
MANIFOLD NEPTUNE II (INSTRUMENTS) ×2 IMPLANT
MASK FACE SPIDER DISP (MASK) ×2 IMPLANT
MAT ABSORB  FLUID 56X50 GRAY (MISCELLANEOUS) ×2
MAT ABSORB FLUID 56X50 GRAY (MISCELLANEOUS) ×2 IMPLANT
NDL SAFETY ECLIPSE 18X1.5 (NEEDLE) ×1 IMPLANT
NEEDLE HYPO 18GX1.5 SHARP (NEEDLE) ×1
NEEDLE HYPO 22GX1.5 SAFETY (NEEDLE) ×2 IMPLANT
NS IRRIG 500ML POUR BTL (IV SOLUTION) ×2 IMPLANT
PACK ARTHROSCOPY SHOULDER (MISCELLANEOUS) ×2 IMPLANT
PAD WRAPON POLAR SHDR XLG (MISCELLANEOUS) ×1 IMPLANT
PASSER SUT CAPTURE FIRST (SUTURE) ×2 IMPLANT
SET TUBE SUCT SHAVER OUTFL 24K (TUBING) ×2 IMPLANT
SET TUBE TIP INTRA-ARTICULAR (MISCELLANEOUS) ×2 IMPLANT
STRAP SAFETY 5IN WIDE (MISCELLANEOUS) ×2 IMPLANT
STRIP CLOSURE SKIN 1/2X4 (GAUZE/BANDAGES/DRESSINGS) ×4 IMPLANT
SUT ETHILON 4-0 (SUTURE) ×2
SUT ETHILON 4-0 FS2 18XMFL BLK (SUTURE) ×2
SUT LASSO 90 DEG SD STR (SUTURE) IMPLANT
SUT MNCRL 4-0 (SUTURE) ×1
SUT MNCRL 4-0 27XMFL (SUTURE) ×1
SUT PDS AB 0 CT1 27 (SUTURE) ×2 IMPLANT
SUT PERFECTPASSER WHITE CART (SUTURE) ×10 IMPLANT
SUT SMART STITCH CARTRIDGE (SUTURE) ×10 IMPLANT
SUT VIC AB 0 CT1 36 (SUTURE) ×2 IMPLANT
SUT VIC AB 2-0 CT2 27 (SUTURE) ×2 IMPLANT
SUTURE ETHLN 4-0 FS2 18XMF BLK (SUTURE) ×2 IMPLANT
SUTURE MAGNUM WIRE 2X48 BLK (SUTURE) IMPLANT
SUTURE MNCRL 4-0 27XMF (SUTURE) ×1 IMPLANT
SYR 10ML LL (SYRINGE) ×2 IMPLANT
TAPE MICROFOAM 4IN (TAPE) ×2 IMPLANT
TUBING ARTHRO INFLOW-ONLY STRL (TUBING) ×2 IMPLANT
TUBING CONNECTING 10 (TUBING) ×2 IMPLANT
WAND HAND CNTRL MULTIVAC 90 (MISCELLANEOUS) ×2 IMPLANT
WRAPON POLAR PAD SHDR XLG (MISCELLANEOUS) ×2

## 2018-07-02 NOTE — Op Note (Signed)
07/02/2018  6:23 PM  PATIENT:  Kyle Gutierrez  84 y.o. male  PRE-OPERATIVE DIAGNOSIS:  full thickness rotator cuff tear of the LEFT shoulder  POST-OPERATIVE DIAGNOSIS:  1.  Full thickness, retracted tear involving the subscapularis 2.  Full-thickness, retracted tear involving the supraspinatus 3.  Spontaneous rupture of the long head of the biceps tendon 4.  Subacromial impingement with bursitis 5.  Acromioclavicular arthrosis  PROCEDURE:  Procedure(s): 1.  LEFT SHOULDER ARTHROSCOPIC SUBSCAPULARIS REPAIR 2.  ARTHROSCOPIC SUBACROMINAL DECOMPRESSION.  3.  ARTHROSCOPIC DISTAL CLAVICLE EXCISION 4.  MINI-OPEN ROTATOR CUFF (supraspinatus) REPAIR  SURGEON:  Surgeon(s) and Role:    Thornton Park, MD - Primary  ANESTHESIA:   local and general   PREOPERATIVE INDICATIONS:  Kyle Gutierrez is a  84 y.o. male with a diagnosis of full thickness rotator cuff tear involving the subscapularis and supraspinatus of the left shoulder who failed conservative treatment and elected for surgical management.  Patient failed nonoperative management with subacromial injection and physical therapy.  MRI revealed a full-thickness and retracted tears involving the supraspinatus and subscapularis after a fall in July 2019.  The risks benefits and alternatives were discussed with the patient preoperatively including but not limited to the risks of infection, bleeding, nerve injury, persistent pain or weakness, failure of the hardware, re-tear of the rotator cuff and the need for further surgery. Medical risks include DVT and pulmonary embolism, myocardial infarction, stroke, pneumonia, respiratory failure and death. Patient understood these risks and wished to proceed.  OPERATIVE IMPLANTS: [place] Multifix anchors x 3 & Smith and Nephew Q Fix anchors x 2  OPERATIVE FINDINGS: Full-thickness and retracted tears of the subscapularis and supraspinatus tendons.  Patient had a preoperative  spontaneous rupture of the long head of the biceps tendon.  He had mild arthritis in the glenohumeral joint and moderate acromioclavicular arthrosis.  Patient had subacromial impingement with bursitis.  OPERATIVE PROCEDURE: The patient was met in the preoperative area. The left shoulder was signed with the word yes and my initials according the hospital's correct site of surgery protocol.   A pre-op History and physical was performed.  Patient did not undergo an interscalene block per the  anesthesiologist's discretion.  Patient was brought to the operating room where he underwent general anesthesia.  The patient was placed in a beachchair position.  A spider arm positioner was used for this case. Examination under anesthesia revealed no limitation of motion or instability with load shift testing. The patient had a negative sulcus sign.  Patient was prepped and draped in a sterile fashion. A timeout was performed to verify the patient's name, date of birth, medical record number, correct site of surgery and correct procedure to be performed there was also used to verify the patient received antibiotics that all appropriate instruments, implants and radiographs studies were available in the room. Once all in attendance were in agreement case began.  Bony landmarks were drawn out with a surgical marker along with proposed arthroscopy incisions. These were pre-injected with 1% lidocaine plain. An 11 blade was used to establish a posterior portal through which the arthroscope was placed in the glenohumeral joint. A full diagnostic examination of the shoulder was performed.    Abundant fraying of the labrum was debrided with a 4 oh resector shaver blade.  Synovitis was debrided using a 90 degree ArthroCare wand.  The attention was then turned to fixing the subscapularis tear.  Lateral portal was created using an 18-gauge spinal needle for localization.  Kingfisher was placed through the lateral portal.  The  retracted subscapularis was then reduced to the lesser tuberosity with the Wisner.  Lysis of adhesions was performed with the ArthroCare wand and a arthroscopic periosteal elevator was used to assist with mobilization of the subscapularis.  Three Perfect Pass sutures were placed into the lateral aspect of the torn tendon of the subscapularis.  An anterior lateral portal was then created with an 18-gauge spinal needle for localization.  A Multifix anchor was then placed into the lesser tuberosity after being loaded with the past sutures placed in the subscapularis tendon.  Sutures were tensioned to allow for reduction of the subscapularis tendon to the lesser tuberosity.  The arthroscope was then placed in the subacromial space.  Extensive bursitis was encountered and debrided using a 4.5m resector shaver blade and a 90 ArthroCare wand from the lateral portal. A subacromial decompression was also performed using a 5.5 mm resector shaver blade from the lateral portal.  The 5.5 mm resector shaver blade was then placed through the anterior portal and a distal clavicle excision was performed.  Two Perfect Pass sutures were placed in the lateral border of the supraspinatus tear. All arthroscopic instruments were then removed and the mini-open portion of the procedure began.  A saber-type incision was made along the lateral border of the acromion. The deltoid muscle was identified and split in line with its fibers which allowed visualization of the rotator cuff. The Perfect Pass sutures previously placed in the lateral border of the rotator cuff were brought out through the deltoid split.  A 5.5 mm resector shaver blade was then used to debride the greater tuberosity of all torn fibers of the rotator cuff.  Two additional Perfect Pass sutures were placed into the border of the supraspinatus tear. Two Smith and NBorgWarnerFix anchors were placed at the articular margin of the humeral head with the greater  tuberosity. The suture limbs of the Q Fix anchor were passed medially through the rotator cuff using a First Pass suture passer.   The four Perfect Pass sutures were then anchored to the greater tuberosity footprint using two Multifix anchors. These anchors were tensioned to allow for anatomic reduction of the rotator cuff to the greater tuberosity. The medial row sutures were then tied down using an arthroscopic knot tying technique.  Arthroscopic images of the repair were taken with the arthroscope both externally and from inside the glenohumeral joint.  All incisions were copiously irrigated. The deltoid fascia was repaired using a 0 Vicryl suture.  The subcutaneous tissue of all incisions were closed with a 2-0 Vicryl. Skin closure for the arthroscopic incisions was performed with 4-0 nylon. The skin edges of the saber incision was approximated with a running 4-0 undyed Monocryl.  0.25% Marcaine plain was then injected into the subacromial space for postoperative pain control. A dry sterile dressing was applied.  The patient was placed in an abduction sling and a Polar Care was applied to the shoulder.  All sharps, sponge and instrument counts were correct at the conclusion of the case. I was scrubbed and present for the entire case. I spoke with the patient's wife postoperatively to let her know the case had gone without complication and the patient was stable in recovery room.

## 2018-07-02 NOTE — Anesthesia Post-op Follow-up Note (Signed)
Anesthesia QCDR form completed.        

## 2018-07-02 NOTE — Progress Notes (Signed)
  Subjective:  POST-OP CHECK:  Patient seen in his hospital room.  Reports left shoulder pain as mild.  Patient just recently got back into bed and feels exhausted.  Patient was admitted for observation at the request of the anesthesiologist due to hypotension (BP in 80's) and decreased oxygenation in the PACU.  She was seen by the hospitalist in the PACU.  Patient denies shortness of breath or chest pain.  Objective:   VITALS:   Vitals:   07/02/18 1655 07/02/18 1827 07/02/18 1921 07/02/18 2028  BP: 118/71  128/63 119/72  Pulse: 80  67 75  Resp:   19 16  Temp: 97.7 F (36.5 C)  98.3 F (36.8 C) 97.6 F (36.4 C)  TempSrc: Oral  Oral Oral  SpO2: 97% 97% 94% 91%    PHYSICAL EXAM: General: Patient alert and in no acute distress.  Patient is sitting up in bed.   Left shoulder: Patient's dressing is clean dry and intact.  He has full digital and wrist range of motion.  Patient's left arm is in a sling.  He has a palpable radial pulse.   LABS  Results for orders placed or performed during the hospital encounter of 07/02/18 (from the past 24 hour(s))  Protime-INR     Status: Abnormal   Collection Time: 07/02/18  8:29 AM  Result Value Ref Range   Prothrombin Time 15.4 (H) 11.4 - 15.2 seconds   INR 1.23     Dg Chest Port 1 View  Result Date: 07/02/2018 CLINICAL DATA:  Shortness of breath. EXAM: PORTABLE CHEST 1 VIEW COMPARISON:  08/13/2013. FINDINGS: Mediastinum and hilar structures normal. Cardiomegaly with normal pulmonary vascularity. Mild bibasilar subsegmental atelectasis and or scarring. Small left pleural effusion. IMPRESSION: 1.  Cardiomegaly. 2. Mild bibasilar subsegmental atelectasis and or scarring. Small left pleural effusion. Electronically Signed   By: Marcello Moores  Register   On: 07/02/2018 15:43    Assessment/Plan: Day of Surgery   Active Problems:   S/P left rotator cuff repair  Patient is stable postop.  I appreciate the hospitalist consult.  Patient will receive  postop antibiotics overnight.  He will be evaluated by physical therapy in the morning for safety upon discharge.  Patient will be restarted on his Coumadin tomorrow.  Blood pressure improved even in the PACU when pressures were taken on his right leg.      Thornton Park , MD 07/02/2018, 8:48 PM

## 2018-07-02 NOTE — Consult Note (Signed)
[facility] [place] NAME: Kyle Gutierrez    MR#:  [id number]  DATE OF BIRTH:  1932/05/19  DATE OF ADMISSION:  07/02/2018  PRIMARY CARE PHYSICIAN: Dion Body, MD   REQUESTING/REFERRING PHYSICIAN: Dr Thornton Park  CHIEF COMPLAINT:  Postoperative hypotension and hypoxia.  HISTORY OF PRESENT ILLNESS:  Kyle Gutierrez  is a 84 y.o. male with a known history of chronic atrial fibrillation, sleep apnea, cardiomyopathy.  He underwent elective left rotator cuff tear today.  Patient was hypotensive with a blood pressure in the 80s and given a dose of vasopressin 1 mg.  They switched his blood pressure cuff to his leg and his blood pressure is better at this point.  Patient was also given a nebulizer treatment.  He states he always has a little shortness of breath.  He feels okay otherwise.  He has pain in the left shoulder like a toothache.  Hospitalist services were contacted for further evaluation.  PAST MEDICAL HISTORY:   Past Medical History:  Diagnosis Date  . Benign prostatic hypertrophy    s/p TURP  . Cardiomyopathy ([place])   . Carotid artery plaque    bilateral  . Chronic atrial fibrillation   . Complication of anesthesia    balance problem  . Coronary artery disease, non-occlusive   . Dyspnea   . Dysrhythmia    atrial fib  . Elevated lipids   . Gross hematuria   . Hypertension   . Neuropathy, alcoholic (HCC)    per Neurologic eval, remote  . Nocturia   . Poor balance   . renal cell carcinoma 2002   left kidney cancer with partial nephrectomy.  Marland Kitchen Retinopathy    followed by [facility]  . Sleep apnea   . Thrombocytopenia, unspecified ([place])   . Urethral stricture   . Urinary frequency   . Valvular cardiomyopathy (HCC)    severe mitral and tricuspid insufficiency, ECHO July 2012    PAST SURGICAL HISTOIRY:   Past Surgical History:  Procedure Laterality Date  . CYSTOSCOPY W/ RETROGRADES N/A 05/06/2013   Procedure: CYSTOSCOPY WITH RETROGRADE PYELOGRAM,  BALLOON DILATION OF URETHRAL STRICTURE;  Surgeon: Dutch Gray, MD;  Location: [facility];  Service: Urology;  Laterality: N/A;  . EYE SURGERY     bilateral cataracts removed  . left rotator cuff repair    . PARTIAL NEPHRECTOMY Left    10'02  . PROSTATE SURGERY    . ROTATOR CUFF REPAIR Right   . TOTAL HIP ARTHROPLASTY Right 06/19/2017   Procedure: TOTAL HIP ARTHROPLASTY ANTERIOR APPROACH;  Surgeon: Hessie Knows, MD;  Location: [facility] ORS;  Service: Orthopedics;  Laterality: Right;  . TRANSURETHRAL RESECTION OF PROSTATE  2003    SOCIAL HISTORY:   Social History   Tobacco Use  . Smoking status: Former Smoker    Last attempt to quit: 03/29/1977    Years since quitting: 41.2  . Smokeless tobacco: Never Used  Substance Use Topics  . Alcohol use: Yes    Comment: daily 5-6 ounces wine or whiskey     FAMILY HISTORY:   Family History  Problem Relation Age of Onset  . Heart disease Mother 34       heart failure  . Early death Father 68  . Heart disease Father        CAD  . Cancer Sister        Breast    DRUG ALLERGIES:   Allergies  Allergen Reactions  . Adhesive [Tape] Rash  and Other (See Comments)    OK to use paper tape  . Latex Rash  . Tapentadol Rash    REVIEW OF SYSTEMS:  CONSTITUTIONAL: No fever, chills or sweats.  Positive for fatigue.  EYES: No blurred or double vision.  EARS, NOSE, AND THROAT: No tinnitus or ear pain.  Wears hearing aids RESPIRATORY: Slight cough.  Always has some shortness of breath.  No wheezing or hemoptysis.  CARDIOVASCULAR: No chest pain, orthopnea, edema.  GASTROINTESTINAL: No nausea, vomiting, diarrhea or abdominal pain.  GENITOURINARY: No dysuria, hematuria.  ENDOCRINE: No polyuria, nocturia,  HEMATOLOGY: No anemia, easy bruising or bleeding SKIN: No rash or lesion. MUSCULOSKELETAL: Positive for left shoulder pain NEUROLOGIC: No tingling, numbness, weakness.  PSYCHIATRY: No anxiety or  depression.   MEDICATIONS AT HOME:   Prior to Admission medications   Medication Sig Start Date End Date Taking? Authorizing Provider  acetaminophen (TYLENOL) 500 MG tablet Take 1,000 mg by mouth 2 (two) times daily.    Yes [provider]  losartan-hydrochlorothiazide (HYZAAR) 100-12.5 MG per tablet Take 1 tablet by mouth daily.    Yes [provider]  metoprolol succinate (TOPROL-XL) 25 MG 24 hr tablet Take 12.5 mg by mouth daily.    Yes [provider]  Multiple Vitamins-Minerals (PRESERVISION AREDS 2 PO) Take 1 capsule by mouth 2 (two) times daily.   Yes [provider]  simvastatin (ZOCOR) 40 MG tablet Take 1 tablet (40 mg total) by mouth every morning. 08/13/13  Yes Crecencio Mc, MD  spironolactone (ALDACTONE) 25 MG tablet Take 25 mg by mouth daily. 12/27/17  Yes [provider]  warfarin (COUMADIN) 4 MG tablet Take 3-4 mg by mouth See admin instructions. Take 4 mg by mouth daily Tuesday and Friday. Take 3 mg by mouth daily on all other days.   Yes [provider]  ondansetron (ZOFRAN) 4 MG tablet Take 1 tablet (4 mg total) by mouth every 8 (eight) hours as needed for nausea or vomiting. 07/02/18   Thornton Park, MD  oxyCODONE (OXY IR/ROXICODONE) 5 MG immediate release tablet Take 1 tablet (5 mg total) by mouth every 4 (four) hours as needed. 07/02/18   Thornton Park, MD      VITAL SIGNS:  Blood pressure 130/72, pulse 87, temperature 97.7 F (36.5 C), resp. rate 15, SpO2 93 %.  PHYSICAL EXAMINATION:  GENERAL:  84 y.o.-year-old patient lying in the bed with no acute distress.  EYES: Pupils equal, round, reactive to light and accommodation. No scleral icterus. Extraocular muscles intact.  HEENT: Head atraumatic, normocephalic. Oropharynx and nasopharynx clear.  NECK:  Supple, no jugular venous distention. No thyroid enlargement, no tenderness.  LUNGS: Decreased breath sounds bilateral bases, no wheezing, rales,rhonchi or  crepitation. No use of accessory muscles of respiration.  CARDIOVASCULAR: S1, S2 irregularly irregular.  3 out of 6 systolic murmurs.  No rubs, or gallops.  ABDOMEN: Soft, nontender, nondistended. Bowel sounds present. No organomegaly or mass.  EXTREMITIES: Trace pedal edema, no cyanosis, or clubbing.  NEUROLOGIC: Cranial nerves II through XII are intact. Muscle strength 5/5 in all extremities. Sensation intact. Gait not checked.  PSYCHIATRIC: The patient is alert and oriented x 3.  SKIN: No obvious rash, lesion, or ulcer.   LABORATORY PANEL:   CBC Recent Labs  Lab 06/26/18 1430  WBC 7.1  HGB 13.0  HCT 42.2  PLT 122*   ------------------------------------------------------------------------------------------------------------------  Chemistries  Recent Labs  Lab 06/26/18 1430  NA 142  K 4.2  CL 108  CO2 29  GLUCOSE 107*  BUN 31*  CREATININE 1.14  CALCIUM 9.2   ------------------------------------------------------------------------------------------------------------------   EKG:   EKG 06/26/2018 shows chronic atrial fibrillation 71 bpm  Monitoring in PACU shows chronic atrial fibrillation in the 80s  IMPRESSION AND PLAN:   1.  Postoperative hypotension.  Blood pressure seems to have recovered at this point with taking blood pressure in his right leg.  Will continue Toprol tomorrow morning and hold Hyzaar at this point until blood pressure stays up. 2.  Postoperative hypoxia.  Incentive spirometer and nebulizer treatments.  Chest x-ray to rule out fluid overload. 3.  Chronic atrial fibrillation.  Restart Coumadin once cleared by orthopedic surgery. 4.  cardiomyopathy and severe mitral regurgitation.  5.  Weakness and falls we will get physical therapy evaluation 6.  Patient drinks alcohol on a daily basis.  He states he does not get shaky if he does not drink.  Continue to monitor closely. 7.  Chronic thrombocytopenia 8.  Sleep apnea on BiPAP at night 9.  History  of renal cell carcinoma status post partial nephrectomy in 2002.  All the records are reviewed and case discussed with Consulting provider. Management plans discussed with the patient, and he is in agreement.  CODE STATUS: Full code  TOTAL TIME TAKING CARE OF THIS PATIENT: 50 minutes.    Loletha Grayer M.D on 07/02/2018 at 3:10 PM  Between 7am to 6pm - Pager - [phone]  After 6pm go to [email or web address] - password [email or web address] [facility]  Office  [phone]  CC: Primary care Physician: Dion Body, MD

## 2018-07-02 NOTE — H&P (Signed)
PREOPERATIVE H&P  Chief Complaint: full thickness rotator cuff tear, left shoulder  HPI: Kyle Gutierrez is a 84 y.o. male who presents for preoperative history and physical with a diagnosis of full thickness rotator cuff tear, left shoulder.  Patient sustained a fall on July 21 injuring his left shoulder.  An MRI has confirmed a full-thickness retracted rotator cuff tear involving the supraspinatus and subscapularis.  Symptoms of pain weakness and limited range of motion are significantly impairing activities of daily living.  He has elected for surgical management.  I have previously repaired the patient's right shoulder rotator cuff in 2015.  Past Medical History:  Diagnosis Date  . Benign prostatic hypertrophy    s/p TURP  . Cardiomyopathy ([place])   . Carotid artery plaque    bilateral  . Chronic atrial fibrillation   . Complication of anesthesia    balance problem  . Coronary artery disease, non-occlusive   . Dyspnea   . Dysrhythmia    atrial fib  . Elevated lipids   . Gross hematuria   . Hypertension   . Neuropathy, alcoholic (HCC)    per Neurologic eval, remote  . Nocturia   . Poor balance   . renal cell carcinoma 2002   left kidney cancer with partial nephrectomy.  Marland Kitchen Retinopathy    followed by [facility]fil[facility]  . Sleep apnea   . Thrombocytopenia, unspecified ([place])   . Urethral stricture   . Urinary frequency   . Valvular cardiomyopathy (HCC)    severe mitral and tricuspid insufficiency, ECHO July 2012   Past Surgical History:  Procedure Laterality Date  . CYSTOSCOPY W/ RETROGRADES N/A 05/06/2013   Procedure: CYSTOSCOPY WITH RETROGRADE PYELOGRAM,  BALLOON DILATION OF URETHRAL STRICTURE;  Surgeon: Dutch Gray, MD;  Location: [facility];  Service: Urology;  Laterality: N/A;  . EYE SURGERY     bilateral cataracts removed  . PARTIAL NEPHRECTOMY Left    10'02  . PROSTATE SURGERY    . ROTATOR CUFF REPAIR Right   . TOTAL HIP ARTHROPLASTY Right 06/19/2017   Procedure: TOTAL HIP  ARTHROPLASTY ANTERIOR APPROACH;  Surgeon: Hessie Knows, MD;  Location: [facility] ORS;  Service: Orthopedics;  Laterality: Right;  . TRANSURETHRAL RESECTION OF PROSTATE  2003   Social History   Socioeconomic History  . Marital status: Married    Spouse name: Not on file  . Number of children: Not on file  . Years of education: Not on file  . Highest education level: Not on file  Occupational History  . Not on file  Social Needs  . Financial resource strain: Not on file  . Food insecurity:    Worry: Not on file    Inability: Not on file  . Transportation needs:    Medical: Not on file    Non-medical: Not on file  Tobacco Use  . Smoking status: Former Smoker    Last attempt to quit: 03/29/1977    Years since quitting: 41.2  . Smokeless tobacco: Never Used  Substance and Sexual Activity  . Alcohol use: Yes    Comment: daily 5-6 ounces wine or whiskey   . Drug use: No  . Sexual activity: Yes  Lifestyle  . Physical activity:    Days per week: Not on file    Minutes per session: Not on file  . Stress: Not on file  Relationships  . Social connections:    Talks on phone: Not on file    Gets together: Not on file    Attends religious  service: Not on file    Active member of club or organization: Not on file    Attends meetings of clubs or organizations: Not on file    Relationship status: Not on file  Other Topics Concern  . Not on file  Social History Narrative  . Not on file   Family History  Problem Relation Age of Onset  . Heart disease Mother 61       heart failure  . Early death Father 60  . Heart disease Father        CAD  . Cancer Sister        Breast   Allergies  Allergen Reactions  . Adhesive [Tape] Rash and Other (See Comments)    OK to use paper tape  . Latex Rash  . Tapentadol Rash   Prior to Admission medications   Medication Sig Start Date End Date Taking? Authorizing Provider  acetaminophen (TYLENOL) 500 MG tablet Take 1,000 mg by mouth 2 (two)  times daily.    Yes [provider]  losartan-hydrochlorothiazide (HYZAAR) 100-12.5 MG per tablet Take 1 tablet by mouth daily.    Yes [provider]  metoprolol succinate (TOPROL-XL) 25 MG 24 hr tablet Take 12.5 mg by mouth daily.    Yes [provider]  Multiple Vitamins-Minerals (PRESERVISION AREDS 2 PO) Take 1 capsule by mouth 2 (two) times daily.   Yes [provider]  simvastatin (ZOCOR) 40 MG tablet Take 1 tablet (40 mg total) by mouth every morning. 08/13/13  Yes Crecencio Mc, MD  spironolactone (ALDACTONE) 25 MG tablet Take 25 mg by mouth daily. 12/27/17  Yes [provider]  warfarin (COUMADIN) 4 MG tablet Take 3-4 mg by mouth See admin instructions. Take 4 mg by mouth daily Tuesday and Friday. Take 3 mg by mouth daily on all other days.   Yes [provider]     Positive ROS: All other systems have been reviewed and were otherwise negative with the exception of those mentioned in the HPI and as above.  Physical Exam: General: Alert, no acute distress Cardiovascular: Regular rate and rhythm, no murmurs rubs or gallops.  No pedal edema Respiratory: Clear to auscultation bilaterally, no wheezes rales or rhonchi. No cyanosis, no use of accessory musculature GI: No organomegaly, abdomen is soft and non-tender nondistended with positive bowel sounds. Skin: Skin intact, no lesions within the operative field. Neurologic: Sensation intact distally Psychiatric: Patient is competent for consent with normal mood and affect Lymphatic: No cervical lymphadenopathy  MUSCULOSKELETAL: Left shoulder: Patient has pain with forward elevation and abduction of the left shoulder at 90 degrees in both directions.  He has no effusion weakness to shoulder abduction and internal rotation.  He has mild weakness to left shoulder external rotation.  He has positive impingement signs.  He has full digital wrist and elbow range of motion, intact sensation  light touch and a palpable radial pulse.  Assessment: full thickness rotator cuff tear, left shoulder  Plan: Plan for Procedure(s): LEFT SHOULDER ARTHROSCOPY WITH OPEN ROTATOR CUFF REPAIR  I reviewed the details of rotator cuff surgery with the patient and his wife at the bedside this morning.  I also reviewed with them the details of the postop recovery.  Patient is aware of what this entails having been through rotator cuff repair surgery on his right shoulder by me in 2015.  I discussed the risks and benefits of surgery. The patient and his wife are aware that the risks include  but are not limited to infection, bleeding, nerve or blood vessel injury, joint stiffness or loss of motion, persistent pain, weakness or instability, and hardware failure and the need for further surgery. Medical risks include but are not limited to DVT and pulmonary embolism, myocardial infarction, stroke, pneumonia, respiratory failure and death. Patient understood these risks and wished to proceed.     Thornton Park, MD   07/02/2018 9:33 AM

## 2018-07-02 NOTE — Anesthesia Preprocedure Evaluation (Addendum)
Anesthesia Evaluation  Patient identified by MRN, date of birth, ID band Patient awake    Reviewed: Allergy & Precautions, H&P , NPO status , Patient's Chart, lab work & pertinent test results  History of Anesthesia Complications (+) PROLONGED EMERGENCE and history of anesthetic complications  Airway Mallampati: II  TM Distance: >3 FB Neck ROM: Full    Dental   Pulmonary shortness of breath and with exertion, sleep apnea and Continuous Positive Airway Pressure Ventilation , former smoker,    Pulmonary exam normal breath sounds clear to auscultation       Cardiovascular hypertension, Pt. on medications + CAD and + Peripheral Vascular Disease  Normal cardiovascular exam+ dysrhythmias Atrial Fibrillation  Rhythm:Regular Rate:Normal     Neuro/Psych  Neuromuscular disease negative psych ROS   GI/Hepatic negative GI ROS, (+)     substance abuse  alcohol use,   Endo/Other  negative endocrine ROS  Renal/GU negative Renal ROS  negative genitourinary   Musculoskeletal  (+) Arthritis , Osteoarthritis,    Abdominal Normal abdominal exam  (+)   Peds negative pediatric ROS (+)  Hematology negative hematology ROS (+)   Anesthesia Other Findings   Reproductive/Obstetrics negative OB ROS                            Anesthesia Physical  Anesthesia Plan  ASA: IV  Anesthesia Plan: General   Post-op Pain Management:    Induction: Intravenous  PONV Risk Score and Plan:   Airway Management Planned: Oral ETT  Additional Equipment:   Intra-op Plan:   Post-operative Plan:   Informed Consent: I have reviewed the patients History and Physical, chart, labs and discussed the procedure including the risks, benefits and alternatives for the proposed anesthesia with the patient or authorized representative who has indicated his/her understanding and acceptance.   Dental advisory given  Plan Discussed  with:   Anesthesia Plan Comments:         Anesthesia Quick Evaluation

## 2018-07-02 NOTE — Anesthesia Procedure Notes (Signed)
Procedure Name: Intubation Date/Time: 07/02/2018 10:03 AM Performed by: Chanetta Marshall, CRNA Pre-anesthesia Checklist: Patient identified, Emergency Drugs available, Suction available and Patient being monitored Patient Re-evaluated:Patient Re-evaluated prior to induction Oxygen Delivery Method: Circle system utilized, Simple face mask, Nasal cannula and Non-rebreather mask Preoxygenation: Pre-oxygenation with 100% oxygen Induction Type: IV induction Ventilation: Mask ventilation without difficulty Laryngoscope Size: Mac and 3 Grade View: Grade II Tube type: Oral Tube size: 7.5 mm Number of attempts: 1 Placement Confirmation: ETT inserted through vocal cords under direct vision,  positive ETCO2,  CO2 detector and breath sounds checked- equal and bilateral Secured at: 21 cm Tube secured with: Tape Dental Injury: Teeth and Oropharynx as per pre-operative assessment

## 2018-07-02 NOTE — Transfer of Care (Signed)
Immediate Anesthesia Transfer of Care Note  Patient: Kyle Gutierrez  Procedure(s) Performed: SHOULDER ARTHROSCOPY WITH MINI OPEN ROTATOR CUFF REPAIR INCLUDING SUBSCAPULARIS ,SUBACROMINAL DECOMPRESSION. (Left Shoulder)  Patient Location: PACU  Anesthesia Type:General  Level of Consciousness: awake, alert  and oriented  Airway & Oxygen Therapy: Patient Spontanous Breathing  Post-op Assessment: Report given to RN and Post -op Vital signs reviewed and stable  Post vital signs: Reviewed and stable  Last Vitals:  Vitals Value Taken Time  BP    Temp    Pulse    Resp    SpO2      Last Pain:  Vitals:   07/02/18 0808  TempSrc: Oral  PainSc: 0-No pain         Complications: No apparent anesthesia complications

## 2018-07-02 NOTE — NC FL2 (Signed)
[place] LEVEL OF CARE SCREENING TOOL     IDENTIFICATION  Patient Name: Kyle Gutierrez Birthdate: 04-Jan-1932 Sex: male Admission Date (Current Location): 07/02/2018  [place] and [place] Number:  Engineering geologist and Address:  [facility], [address] [place], [place] [address]      Provider Number: [id number]  Attending Physician Name and Address:  Thornton Park, MD  Relative Name and Phone Number:       Current Level of Care: Hospital Recommended Level of Care: [place] Prior Approval Number:    Date Approved/Denied:   PASRR Number: ([id number] )  Discharge Plan: SNF    Current Diagnoses: Patient Active Problem List   Diagnosis Date Noted  . S/P left rotator cuff repair 07/02/2018  . Primary localized osteoarthritis of right hip 06/19/2017  . Urethral stricture 07/30/2016  . Bilateral carotid artery stenosis 08/10/2014  . Bilateral hearing loss 07/05/2014  . Edema 05/31/2014  . Pain in limb 05/10/2014  . S/P rotator cuff surgery 05/03/2014  . Dizziness and giddiness 05/02/2014  . Cough 08/15/2013  . History of tobacco abuse 08/13/2013  . OSA (obstructive sleep apnea) 06/01/2013  . Pyelonephritis 05/20/2013  . Myoclonic jerking while sleeping 05/20/2013  . Routine general medical examination at a health care facility 04/16/2013  . Long term (current) use of anticoagulants 08/11/2012  . Hematuria, gross 04/11/2012  . Chronic atrial fibrillation   . Coronary artery disease, non-occlusive   . Carotid artery plaque   . Valvular cardiomyopathy ([place])   . renal cell carcinoma   . Benign prostatic hyperplasia   . Screening for colon cancer 02/14/2012  . Retinopathy   . Pulmonary nodule/lesion, solitary 08/16/2011  . Substance abuse ([place])   . Thrombocytopenia, unspecified ([place])   . Neuropathy, alcoholic ([place])   . Peripheral vascular disease ([place]) 08/06/2011  . LUMBAR RADICULOPATHY, LEFT  05/02/2009  . PERIPHERAL NEUROPATHY 01/05/2009  . HYPERTENSION 12/05/2008  . ATRIAL FIBRILLATION 12/05/2008  . SKIN CANCER, HX OF 12/05/2008  . COLONIC POLYPS, HX OF 12/05/2008  . DIVERTICULITIS, HX OF 12/05/2008    Orientation RESPIRATION BLADDER Height & Weight     Self, Time, Situation, Place  O2(2 Liters Oxygen. ) Continent Weight:   Height:     BEHAVIORAL SYMPTOMS/MOOD NEUROLOGICAL BOWEL NUTRITION STATUS      Continent Diet(Diet: Clear Liquid to be advanced. )  AMBULATORY STATUS COMMUNICATION OF NEEDS Skin   Extensive Assist Verbally Surgical wounds(Incision: Left Shoulder )                       Personal Care Assistance Level of Assistance  Bathing, Feeding, Dressing Bathing Assistance: Limited assistance Feeding assistance: Independent Dressing Assistance: Limited assistance     Functional Limitations Info  Sight, Hearing, Speech Sight Info: Adequate Hearing Info: Adequate Speech Info: Adequate    SPECIAL CARE FACTORS FREQUENCY  PT (By licensed PT), OT (By licensed OT)     PT Frequency: (5) OT Frequency: (5)            Contractures      Additional Factors Info  Code Status, Allergies Code Status Info: (Full Code. ) Allergies Info: (Adhesive Tape, Latex, Tapentadol)           Current Medications (07/02/2018):  This is the current hospital active medication list Current Facility-Administered Medications  Medication Dose Route Frequency Provider Last Rate Last Dose  . 0.9 %  sodium chloride infusion   Intravenous Continuous Thornton Park,  MD 75 mL/hr at 07/02/18 1620    . [START ON 07/03/2018] acetaminophen (TYLENOL) tablet 325-650 mg  325-650 mg Oral Q6H PRN Thornton Park, MD      . acetaminophen (TYLENOL) tablet 500 mg  500 mg Oral Q6H Thornton Park, MD      . albuterol (PROVENTIL) (2.5 MG/3ML) 0.083% nebulizer solution 2.5 mg  2.5 mg Nebulization Q6H PRN Loletha Grayer, MD      . alum & mag hydroxide-simeth (MAALOX/MYLANTA)  200-200-20 MG/5ML suspension 30 mL  30 mL Oral Q4H PRN Thornton Park, MD      . bisacodyl (DULCOLAX) suppository 10 mg  10 mg Rectal Daily PRN Thornton Park, MD      . ceFAZolin (ANCEF) IVPB 1 g/50 mL premix  1 g Intravenous Q6H Thornton Park, MD 100 mL/hr at 07/02/18 1706 1 g at 07/02/18 1706  . docusate sodium (COLACE) capsule 100 mg  100 mg Oral BID Thornton Park, MD      . HYDROcodone-acetaminophen (NORCO/VICODIN) 5-325 MG per tablet 1-2 tablet  1-2 tablet Oral Q4H PRN Thornton Park, MD   2 tablet at 07/02/18 1623  . ketorolac (TORADOL) 15 MG/ML injection 7.5 mg  7.5 mg Intravenous Q6H Thornton Park, MD      . magnesium citrate solution 1 Bottle  1 Bottle Oral Once PRN Thornton Park, MD      . menthol-cetylpyridinium (CEPACOL) lozenge 3 mg  1 lozenge Oral PRN Thornton Park, MD       Or  . phenol (CHLORASEPTIC) mouth spray 1 spray  1 spray Mouth/Throat PRN Thornton Park, MD      . methocarbamol (ROBAXIN) tablet 500 mg  500 mg Oral Q6H PRN Thornton Park, MD       Or  . methocarbamol (ROBAXIN) 500 mg in dextrose 5 % 50 mL IVPB  500 mg Intravenous Q6H PRN Thornton Park, MD      . Derrill Memo ON 07/03/2018] metoprolol succinate (TOPROL-XL) 24 hr tablet 12.5 mg  12.5 mg Oral Daily Wieting, Richard, MD      . morphine 4 MG/ML injection 0.52-1 mg  0.52-1 mg Intravenous Q2H PRN Thornton Park, MD      . multivitamin-lutein (OCUVITE-LUTEIN) capsule   Oral BID Thornton Park, MD      . ondansetron [facility]) tablet 4 mg  4 mg Oral Q6H PRN Thornton Park, MD       Or  . ondansetron [facility]) injection 4 mg  4 mg Intravenous Q6H PRN Thornton Park, MD      . polyethylene glycol (MIRALAX / GLYCOLAX) packet 17 g  17 g Oral Daily PRN Thornton Park, MD      . Derrill Memo ON 07/03/2018] simvastatin (ZOCOR) tablet 40 mg  40 mg Oral q morning - 10a Thornton Park, MD      . Derrill Memo ON 07/03/2018] spironolactone (ALDACTONE) tablet 25 mg  25 mg Oral Daily Wieting, Richard, MD      .  traMADol Veatrice Bourbon) tablet 50 mg  50 mg Oral Q6H Thornton Park, MD      . warfarin (COUMADIN) tablet 5 mg  5 mg Oral ONCE-1800 Ramond Dial, RPH      . Warfarin - Pharmacist Dosing Inpatient   Does not apply q1800 Ramond Dial, [facility]         Discharge Medications: Please see discharge summary for a list of discharge medications.  Relevant Imaging Results:  Relevant Lab Results:   Additional Information (SSN: [id number])  Aarna Mihalko, Veronia Beets, LCSW

## 2018-07-02 NOTE — Progress Notes (Signed)
Patient declined home CPAP.

## 2018-07-02 NOTE — Consult Note (Signed)
ANTICOAGULATION CONSULT NOTE - Initial Consult  Pharmacy Consult for warfarin Indication: atrial fibrillation  Allergies  Allergen Reactions  . Adhesive [Tape] Rash and Other (See Comments)    OK to use paper tape  . Latex Rash  . Tapentadol Rash    Patient Measurements:   Heparin Dosing Weight:   Vital Signs: Temp: 97.3 F (36.3 C) (10/31 1526) Temp Source: Oral (10/31 0808) BP: 126/71 (10/31 1526) Pulse Rate: 87 (10/31 1526)  Labs: Recent Labs    07/02/18 0829  LABPROT 15.4*  INR 1.23    Estimated Creatinine Clearance: 57.8 mL/min (by C-G formula based on SCr of 1.14 mg/dL).   Medical History: Past Medical History:  Diagnosis Date  . Benign prostatic hypertrophy    s/p TURP  . Cardiomyopathy ([place])   . Carotid artery plaque    bilateral  . Chronic atrial fibrillation   . Complication of anesthesia    balance problem  . Coronary artery disease, non-occlusive   . Dyspnea   . Dysrhythmia    atrial fib  . Elevated lipids   . Gross hematuria   . Hypertension   . Neuropathy, alcoholic (H[facility])    per Neurologic eval, remote  . Nocturia   . Poor balance   . renal cell carcinoma 2002   left kidney cancer with partial nephrectomy.  [facility] Kitchen Retinopathy    followed by [facility]  . Sleep apnea   . Thrombocytopenia, unspecified ([place])   . Urethral stricture   . Urinary frequency   . Valvular cardiomyopathy ([facility])    severe mitral and tricuspid insufficiency, ECHO July 2012    Medications:  Scheduled:  . acetaminophen  500 mg Oral Q6H  . docusate sodium  100 mg Oral BID  . ketorolac  7.5 mg Intravenous Q6H  . [START ON 07/03/2018] metoprolol succinate  12.5 mg Oral Daily  . PRESERVISION AREDS 2   Oral BID  . [START ON 07/03/2018] simvastatin  40 mg Oral q morning - 10a  . [START ON 07/03/2018] spironolactone  25 mg Oral Daily  . traMADol  50 mg Oral Q6H  . warfarin  5 mg Oral ONCE-1800  . Warfarin - Pharmacist Dosing Inpatient   Does not apply q1800     Assessment: Patient is a 84 year old male w/ PMH of afib on warfarin PTA. Underwent elective rotator cuff surgery today. INR this AM was 1.23 since it was held for surgery. Patient is being admitted for post op hypotension/SOB. Pharmacy consulted to restart warfarin. Home dose is 4mg  on Tues and Friday and 3mg  all other days.  Goal of Therapy:  INR 2-3 Monitor platelets by anticoagulation protocol: Yes   Plan:  Will give 5mg  tonight and then resume pt home dose tomorrow.  Ramond Dial, Pharm.D, BCPS Clinical Pharmacist 07/02/2018,3:34 PM

## 2018-07-03 LAB — BASIC METABOLIC PANEL
ANION GAP: 10 (ref 5–15)
BUN: 31 mg/dL — AB (ref 8–23)
CALCIUM: 8.3 mg/dL — AB (ref 8.9–10.3)
CO2: 25 mmol/L (ref 22–32)
Chloride: 99 mmol/L (ref 98–111)
Creatinine, Ser: 1.42 mg/dL — ABNORMAL HIGH (ref 0.61–1.24)
GFR calc Af Amer: 51 mL/min — ABNORMAL LOW (ref >60)
GFR calc non Af Amer: 44 mL/min — ABNORMAL LOW (ref >60)
GLUCOSE: 123 mg/dL — AB (ref 70–99)
Potassium: 4.3 mmol/L (ref 3.5–5.1)
Sodium: 134 mmol/L — ABNORMAL LOW (ref 135–145)

## 2018-07-03 LAB — CBC
HEMATOCRIT: 40.4 % (ref 39.0–52.0)
Hemoglobin: 12.7 g/dL — ABNORMAL LOW (ref 13.0–17.0)
MCH: 30.8 pg (ref 26.0–34.0)
MCHC: 31.4 g/dL (ref 30.0–36.0)
MCV: 97.8 fL (ref 80.0–100.0)
NRBC: 0 % (ref 0.0–0.2)
Platelets: 133 10*3/uL — ABNORMAL LOW (ref 150–400)
RBC: 4.13 MIL/uL — ABNORMAL LOW (ref 4.22–5.81)
RDW: 13.3 % (ref 11.5–15.5)
WBC: 11.9 10*3/uL — ABNORMAL HIGH (ref 4.0–10.5)

## 2018-07-03 LAB — PROTIME-INR
INR: 1.14
Prothrombin Time: 14.5 seconds (ref 11.4–15.2)

## 2018-07-03 MED ORDER — DOCUSATE SODIUM 100 MG PO CAPS: 100.0000 mg | ORAL_CAPSULE | Freq: Two times a day (BID) | ORAL | 0 refills | Status: DC

## 2018-07-03 MED ORDER — WARFARIN SODIUM 5 MG PO TABS
5.0000 mg | ORAL_TABLET | Freq: Once | ORAL | Status: AC
  Administered 2018-07-03: 5 mg via ORAL
  Filled 2018-07-03: qty 1

## 2018-07-03 MED ORDER — HYDROCHLOROTHIAZIDE 12.5 MG PO CAPS
12.5000 mg | ORAL_CAPSULE | Freq: Every day | ORAL | Status: DC
  Administered 2018-07-03 – 2018-07-04 (×2): 12.5 mg via ORAL
  Filled 2018-07-03 (×2): qty 1

## 2018-07-03 MED ORDER — LOSARTAN POTASSIUM 50 MG PO TABS
100.0000 mg | ORAL_TABLET | Freq: Every day | ORAL | Status: DC
  Administered 2018-07-03 – 2018-07-04 (×2): 100 mg via ORAL
  Filled 2018-07-03 (×2): qty 2

## 2018-07-03 NOTE — Care Management Obs Status (Signed)
[place] NOTIFICATION   Patient Details  Name: Kyle Gutierrez MRN: [id number] Date of Birth: 09-Mar-1932   Medicare Observation Status Notification Given:  Yes    Jolly Mango, RN 07/03/2018, 4:14 PM

## 2018-07-03 NOTE — Progress Notes (Signed)
PT Cancellation Note  Patient Details Name: Kyle Gutierrez MRN: [id number] DOB: 12/19/1931   Cancelled Treatment:    Reason Eval/Treat Not Completed: (Treatment session attempted.  Patient currently declines, stating he just returned to bed  and prefers to stay settled. Agreeable to re-attempt tomorrow AM.)  Reyes Ivan. Owens Shark, PT, DPT, NCS 07/03/18, 4:32 PM [phone]

## 2018-07-03 NOTE — Evaluation (Signed)
Occupational Therapy Evaluation Patient Details Name: Kyle Gutierrez MRN: [id number] DOB: Feb 03, 1932 Today's Date: 07/03/2018    History of Present Illness Pt. is an 84 y.o. male who was admitted to [facility] for acute hospitalization status post Left RTC repair (07/02/18).   Clinical Impression   Pt. presents with LUE immobilization, weakness, limited activity tolerance, and limited functional mobility which limits his ability to complete basic ADL and IADL functioning. Pt. BP 130/75, HR 87, SO2 95%. Pt. Resides at home with his wife. Pt. Was independent with ADLs, and IADL functioning: including meal preparation, and medication management. Pt. Was able to drive and plays golf on a regular basis. Pt. education was provided about A/E use for LE ADLs, UE one armed dressing techniques, and brace management. Pt. has 3 reachers at home, and a sockaide. Pt. Reports having had shoulder surgery on the right shoulder several years prior, and reports that he hasn't forgotten how to do everything. Pt. Could benefit from OT services for ADL training, A/E training, and pt. education about home modification, and DME. Pt. would benefit from SNF level of care upon discharge, prior to returning home with his wife. Pt. Could benefit from follow-up OT services at discharge.     Follow Up Recommendations  SNF    Equipment Recommendations       Recommendations for Other Services       Precautions / Restrictions Precautions Precautions: Fall Restrictions Weight Bearing Restrictions: Yes LUE Weight Bearing: Non weight bearing      Mobility Bed Mobility    Pt. Up in chair upon arrival           General bed mobility comments: seated edge of bed upon arrival to session, in recliner end of session  Transfers Overall transfer level: Needs assistance Equipment used: Straight cane Transfers: Sit to/from Stand Sit to Stand: Min assist         General transfer comment: requires elevated suface  height, multiple attempts/use of momentum and UE support on foot of bed to safety complete sit/stand; very broad BOS, poor balance/stability with transfer    Balance Overall balance assessment: Needs assistance Sitting-balance support: No upper extremity supported;Feet supported Sitting balance-Leahy Scale: Good     Standing balance support: Single extremity supported Standing balance-Leahy Scale: Poor                             ADL either performed or assessed with clinical judgement   ADL Overall ADL's : Needs assistance/impaired Eating/Feeding: Set up;Minimal assistance;Sitting   Grooming: Set up;Minimal assistance;Sitting   Upper Body Bathing: Set up;Maximal assistance   Lower Body Bathing: Set up;Minimal assistance   Upper Body Dressing : Set up;Moderate assistance   Lower Body Dressing: Set up;Minimal assistance                 General ADL Comments: Pt. education was provided about A/E use for LE ADLs.     Vision Patient Visual Report: No change from baseline       Perception     Praxis      Pertinent Vitals/Pain Pain Assessment: 0-10 Pain Score: 9  Faces Pain Scale: Hurts even more Pain Location: Left shoulder Pain Descriptors / Indicators: Aching Pain Intervention(s): Limited activity within patient's tolerance;Monitored during session     Hand Dominance Right   Extremity/Trunk Assessment Upper Extremity Assessment Upper Extremity Assessment: Generalized weakness  LUE immobilization       Communication Communication Communication: [facility]  Cognition Arousal/Alertness: Awake/alert Behavior During Therapy: WFL for tasks assessed/performed Overall Cognitive Status: Within Functional Limits for tasks assessed                                     General Comments       Exercises   Shoulder Instructions      Home Living Family/patient expects to be discharged to:: Private residence Living Arrangements:  Spouse/significant other Available Help at Discharge: Family Type of Home: House([place]) Home Access: Stairs to enter Technical brewer of Steps: 3 Entrance Stairs-Rails: Right;Left Home Layout: One level               Home Equipment: [place] - single point;Walker - 2 wheels          Prior Functioning/Environment Level of Independence: Independent with assistive device(s)        Comments: Independent with ADL/IADLs. Plays golf regularly.        OT Problem List: Decreased strength;Impaired UE functional use;Decreased knowledge of use of DME or AE;Decreased activity tolerance;Pain      OT Treatment/Interventions: Self-care/ADL training;Therapeutic exercise;Therapeutic activities;Patient/family education;DME and/or AE instruction;Energy conservation    OT Goals(Current goals can be found in the care plan section) Acute Rehab OT Goals Patient Stated Goal: to stay another night in the hospital  OT Frequency: Min 2X/week   Barriers to D/C:            Co-evaluation              AM-PAC PT "6 Clicks" Daily Activity     Outcome Measure Help from another person eating meals?: A Little Help from another person taking care of personal grooming?: A Little Help from another person toileting, which includes using toliet, bedpan, or urinal?: A Little Help from another person bathing (including washing, rinsing, drying)?: A Lot Help from another person to put on and taking off regular upper body clothing?: A Lot Help from another person to put on and taking off regular lower body clothing?: A Lot 6 Click Score: 15   End of Session    Activity Tolerance: Patient limited by pain Patient left: in chair  OT Visit Diagnosis: Muscle weakness (generalized) (M62.81)                Time: 1020-1045 OT Time Calculation (min): 25 min Charges:  OT General Charges $OT Visit: 1 Visit OT Evaluation $OT Eval Moderate Complexity: 1 Mod  Harrel Carina, MS, OTR/L   Harrel Carina 07/03/2018, 12:10 PM

## 2018-07-03 NOTE — Evaluation (Signed)
Physical Therapy Evaluation Patient Details Name: Kyle Gutierrez MRN: [id number] DOB: 1932/04/07 Today's Date: 07/03/2018   History of Present Illness  admitted for acute hospitalization status post L RTC repair (07/02/18), NWB.  Clinical Impression  Upon evaluation, patient alert and oriented; follows commands and demonstrates fair insight into safety needs.  L UE immobilized in sling, maintained NWB throughout; good L wrist, hand and finger use, minimal/no edema, good capillary refill noted.  R UE, bilat LEs grossly functional for basic transfers and mobility; however, balance noted with significant impairment, requiring min/mod assist for all standing activities to maintain balance/prevent fall.  Did initiate use of loftstrand (vs SPC) with noted improvements in stability and overall comfort/control of movement; however, patient still unsteady and uncomfortable attempting gait away from bedside.  Very high fall risk appreciated without assist device and extensive physical assist at all times. Of note, BP stable and WFL (noted history of post-op hypotension previous date); however, HR elevated to 130-140s with sit/stand and into 150s with very short-distance gait.  Patient denies chest pain/pressure, but does demosntrates significant SOB with activity.  RN informed/aware.  Additional gait/activity deferred as result. Would benefit from skilled PT to address above deficits and promote optimal return to PLOF;; recommend transition to STR upon discharge from acute hospitalization.     Follow Up Recommendations SNF    Equipment Recommendations       Recommendations for Other Services       Precautions / Restrictions Precautions Precautions: Fall Restrictions Weight Bearing Restrictions: Yes LUE Weight Bearing: Non weight bearing      Mobility  Bed Mobility               General bed mobility comments: seated edge of bed upon arrival to session, in recliner end of  session  Transfers Overall transfer level: Needs assistance Equipment used: Straight cane Transfers: Sit to/from Stand Sit to Stand: Min assist         General transfer comment: requires elevated suface height, multiple attempts/use of momentum and UE support on foot of bed to safety complete sit/stand; very broad BOS, poor balance/stability with transfer  Ambulation/Gait Ambulation/Gait assistance: Min assist;Mod assist Gait Distance (Feet): 8 Feet         General Gait Details: patient unsteady/unsafe with unilateral assist device, self-elects to hold foot of bed vs assist device; refuses any distance where releasing foot of bed is required.  Very unsteady and unsafe to complete without extensive physical assist; very high fall risk.  Stairs            Wheelchair Mobility    Modified Rankin (Stroke Patients Only)       Balance Overall balance assessment: Needs assistance Sitting-balance support: No upper extremity supported;Feet supported Sitting balance-Leahy Scale: Good     Standing balance support: Single extremity supported Standing balance-Leahy Scale: Poor                               Pertinent Vitals/Pain Pain Assessment: Faces Faces Pain Scale: Hurts even more Pain Location: L shoulder Pain Descriptors / Indicators: Aching;Grimacing;Guarding Pain Intervention(s): Limited activity within patient's tolerance;Monitored during session;Repositioned    Home Living Family/patient expects to be discharged to:: Private residence Living Arrangements: Spouse/significant other Available Help at Discharge: Family Type of Home: House Home Access: Stairs to enter Entrance Stairs-Rails: Psychiatric nurse of Steps: 3 Home Layout: One level Home Equipment: Cane - single point;Walker - 2 wheels  Prior Function Level of Independence: Independent with assistive device(s)         Comments: Mod indep for ADLs, household and  very limited community mobilization; denies assist device within the home, SPC outside of home.  Does endorse at least one fall within previous six months.     Hand Dominance   Dominant Hand: Right    Extremity/Trunk Assessment   Upper Extremity Assessment Upper Extremity Assessment: (R UE grossly WFL; L UE immobilized in sling-wrist, hand and fingers all grossly WFL, good fucntional use, good capillary refill, no edema noted)    Lower Extremity Assessment Lower Extremity Assessment: Generalized weakness(grossly 4-/5 throughout; generally unsteady with poor balance reactions)       Communication   Communication: HOH  Cognition Arousal/Alertness: Awake/alert Behavior During Therapy: WFL for tasks assessed/performed Overall Cognitive Status: Within Functional Limits for tasks assessed                                        General Comments      Exercises Other Exercises Other Exercises: Sit/stand with R loftstrand crutch, min/mod assist; step by step cuing for loftstrand placement and use.  Able to take 3 steps forward/backward with loftstrand, min/mod assist, reporting imprvoed comfort/confidence with loftstrand vs SPC; however, still uncomfortable to attempt away from bed surface. Other Exercises: Standing at sink for oral care/hygiene, min assist; anterior LOB requiring mod assist for recovery with functional reach beyond immediate BOS.   Assessment/Plan    PT Assessment Patient needs continued PT services  PT Problem List Decreased strength;Decreased range of motion;Decreased activity tolerance;Decreased balance;Decreased mobility;Decreased coordination;Decreased cognition;Impaired sensation;Decreased knowledge of use of DME;Decreased safety awareness;Decreased knowledge of precautions;Cardiopulmonary status limiting activity       PT Treatment Interventions DME instruction;Gait training;Stair training;Functional mobility training;Therapeutic  activities;Therapeutic exercise;Balance training;Patient/family education    PT Goals (Current goals can be found in the Care Plan section)  Acute Rehab PT Goals Patient Stated Goal: to stay another night in the hospital PT Goal Formulation: With patient Time For Goal Achievement: 07/17/18 Potential to Achieve Goals: Fair    Frequency BID   Barriers to discharge        Co-evaluation               AM-PAC PT "6 Clicks" Daily Activity  Outcome Measure Difficulty turning over in bed (including adjusting bedclothes, sheets and blankets)?: A Lot Difficulty moving from lying on back to sitting on the side of the bed? : A Lot Difficulty sitting down on and standing up from a chair with arms (e.g., wheelchair, bedside commode, etc,.)?: Unable Help needed moving to and from a bed to chair (including a wheelchair)?: A Lot Help needed walking in hospital room?: A Lot Help needed climbing 3-5 steps with a railing? : Total 6 Click Score: 10    End of Session Equipment Utilized During Treatment: Gait belt Activity Tolerance: Treatment limited secondary to medical complications (Comment)(abnormally elevated HR with activity) Patient left: in chair;with call bell/phone within reach(OT to place end of session (entering room after PT eval)) Nurse Communication: Mobility status(HR response to activity) PT Visit Diagnosis: Unsteadiness on feet (R26.81);Muscle weakness (generalized) (M62.81);Difficulty in walking, not elsewhere classified (R26.2);Pain Pain - Right/Left: Left Pain - part of body: Shoulder    Time: 0930-1010 PT Time Calculation (min) (ACUTE ONLY): 40 min   Charges:   PT Evaluation $PT Eval Moderate Complexity: 1 Mod  PT Treatments $Gait Training: 8-22 mins $Therapeutic Activity: 8-22 mins        Troi Bechtold H. Owens Shark, PT, DPT, NCS 07/03/18, 11:28 AM [phone]

## 2018-07-03 NOTE — Progress Notes (Signed)
  Subjective:  POD #1 s/p left rotator cuff repair.   Patient reports left shoulder pain as mild.  Patient has had difficulty getting up out of bed and ambulating due to weakness.   Objective:   VITALS:   Vitals:   07/03/18 0813 07/03/18 1125 07/03/18 1208 07/03/18 1633  BP: 136/74 (!) 145/75  134/82  Pulse: 63 82 93 74  Resp:  (!) 22  20  Temp: 97.8 F (36.6 C) 98.3 F (36.8 C)  (!) 97.4 F (36.3 C)  TempSrc: Oral Oral  Oral  SpO2: 97% 95%  100%    PHYSICAL EXAM: Left shoulder: Neurovascular intact Sensation intact distally Intact pulses distally Dorsiflexion/Plantar flexion intact Incision: dressing C/D/I No cellulitis present Compartment soft  LABS  Results for orders placed or performed during the hospital encounter of 07/02/18 (from the past 24 hour(s))  CBC     Status: Abnormal   Collection Time: 07/03/18  5:44 AM  Result Value Ref Range   WBC 11.9 (H) 4.0 - 10.5 K/uL   RBC 4.13 (L) 4.22 - 5.81 MIL/uL   Hemoglobin 12.7 (L) 13.0 - 17.0 g/dL   HCT 40.4 39.0 - 52.0 %   MCV 97.8 80.0 - 100.0 fL   MCH 30.8 26.0 - 34.0 pg   MCHC 31.4 30.0 - 36.0 g/dL   RDW 13.3 11.5 - 15.5 %   Platelets 133 (L) 150 - 400 K/uL   nRBC 0.0 0.0 - 0.2 %  Basic metabolic panel     Status: Abnormal   Collection Time: 07/03/18  5:44 AM  Result Value Ref Range   Sodium 134 (L) 135 - 145 mmol/L   Potassium 4.3 3.5 - 5.1 mmol/L   Chloride 99 98 - 111 mmol/L   CO2 25 22 - 32 mmol/L   Glucose, Bld 123 (H) 70 - 99 mg/dL   BUN 31 (H) 8 - 23 mg/dL   Creatinine, Ser 1.42 (H) 0.61 - 1.24 mg/dL   Calcium 8.3 (L) 8.9 - 10.3 mg/dL   GFR calc non Af Amer 44 (L) >60 mL/min   GFR calc Af Amer 51 (L) >60 mL/min   Anion gap 10 5 - 15  Protime-INR     Status: None   Collection Time: 07/03/18  5:44 AM  Result Value Ref Range   Prothrombin Time 14.5 11.4 - 15.2 seconds   INR 1.14     Dg Chest Port 1 View  Result Date: 07/02/2018 CLINICAL DATA:  Shortness of breath. EXAM: PORTABLE CHEST 1 VIEW  COMPARISON:  08/13/2013. FINDINGS: Mediastinum and hilar structures normal. Cardiomegaly with normal pulmonary vascularity. Mild bibasilar subsegmental atelectasis and or scarring. Small left pleural effusion. IMPRESSION: 1.  Cardiomegaly. 2. Mild bibasilar subsegmental atelectasis and or scarring. Small left pleural effusion. Electronically Signed   By: Marcello Moores  Register   On: 07/02/2018 15:43    Assessment/Plan: 1 Day Post-Op   Active Problems:   S/P left rotator cuff repair  Patient receiving PT.   He will have PT again in the AM and then be discharged to [facility].  He will have a 3 day respite stay at the SNF there.  He will receive PT on Sunday at [facility].   He will follow up with me in the office in 10 days.    Thornton Park , MD 07/03/2018, 5:22 PM

## 2018-07-03 NOTE — Progress Notes (Signed)
[place] [facility] [place] NAME: Kyle Gutierrez    MR#:  [id number]  DATE OF BIRTH:  1931-12-02  SUBJECTIVE:  CHIEF COMPLAINT:  No chief complaint on file. sitting in bed, no complaints. Wants to go home REVIEW OF SYSTEMS:  Review of Systems  Constitutional: Negative for chills, fever and weight loss.  HENT: Negative for nosebleeds and sore throat.   Eyes: Negative for blurred vision.  Respiratory: Negative for cough, shortness of breath and wheezing.   Cardiovascular: Negative for chest pain, orthopnea, leg swelling and PND.  Gastrointestinal: Negative for abdominal pain, constipation, diarrhea, heartburn, nausea and vomiting.  Genitourinary: Negative for dysuria and urgency.  Musculoskeletal: Negative for back pain.  Skin: Negative for rash.  Neurological: Negative for dizziness, speech change, focal weakness and headaches.  Endo/Heme/Allergies: Does not bruise/bleed easily.  Psychiatric/Behavioral: Negative for depression.    DRUG ALLERGIES:   Allergies  Allergen Reactions  . Adhesive [Tape] Rash and Other (See Comments)    OK to use paper tape  . Latex Rash  . Tapentadol Rash   VITALS:  Blood pressure (!) 145/75, pulse 93, temperature 98.3 F (36.8 C), temperature source Oral, resp. rate (!) 22, SpO2 95 %. PHYSICAL EXAMINATION:  Physical Exam  Constitutional: He is oriented to person, place, and time.  HENT:  Head: Normocephalic and atraumatic.  Eyes: Pupils are equal, round, and reactive to light. Conjunctivae and EOM are normal.  Neck: Normal range of motion. Neck supple. No tracheal deviation present. No thyromegaly present.  Cardiovascular: Normal rate, regular rhythm and normal heart sounds.  Pulmonary/Chest: Effort normal and breath sounds normal. No respiratory distress. He has no wheezes. He exhibits no tenderness.  Abdominal: Soft. Bowel sounds are normal. He exhibits no distension. There is no tenderness.    Musculoskeletal: Normal range of motion.  Left arm in sling  Neurological: He is alert and oriented to person, place, and time. No cranial nerve deficit.  Skin: Skin is warm and dry. No rash noted.   LABORATORY PANEL:  Male CBC Recent Labs  Lab 07/03/18 0544  WBC 11.9*  HGB 12.7*  HCT 40.4  PLT 133*   ------------------------------------------------------------------------------------------------------------------ Chemistries  Recent Labs  Lab 07/03/18 0544  NA 134*  K 4.3  CL 99  CO2 25  GLUCOSE 123*  BUN 31*  CREATININE 1.42*  CALCIUM 8.3*   RADIOLOGY:  Dg Chest Port 1 View  Result Date: 07/02/2018 CLINICAL DATA:  Shortness of breath. EXAM: PORTABLE CHEST 1 VIEW COMPARISON:  08/13/2013. FINDINGS: Mediastinum and hilar structures normal. Cardiomegaly with normal pulmonary vascularity. Mild bibasilar subsegmental atelectasis and or scarring. Small left pleural effusion. IMPRESSION: 1.  Cardiomegaly. 2. Mild bibasilar subsegmental atelectasis and or scarring. Small left pleural effusion. Electronically Signed   By: Marcello Moores  Register   On: 07/02/2018 15:43   ASSESSMENT AND PLAN:   1.  Postoperative hypotension: now resolved. 2.  Postoperative hypoxia: transient and resolved. CXR shows atelectesis and small lt pleural effusion 3.  Chronic atrial fibrillation: Restart Coumadin once cleared by orthopedic surgery at D/C 4.  cardiomyopathy and severe mitral regurgitation.  5.  Weakness and falls - physical therapy recommends STR/SNF 6.  Patient drinks alcohol on a daily basis.  He states he does not get shaky if he does not drink.  Continue to monitor closely. 7.  Chronic thrombocytopenia 8.  Sleep apnea on BiPAP at night 9.  History of renal cell carcinoma status post partial nephrectomy in 2002.  Ok to d/c from medical standpoint.   All the records are reviewed and case discussed with Care Management/Social Worker. Management plans discussed with the patient, nursing  and they are in agreement.  CODE STATUS: Full Code  TOTAL TIME TAKING CARE OF THIS PATIENT: 15 minutes.   More than 50% of the time was spent in counseling/coordination of care: YES  POSSIBLE D/C IN 1-2 DAYS, DEPENDING ON CLINICAL CONDITION.   Max Sane M.D on 07/03/2018 at 1:53 PM  Between 7am to 6pm - Pager - [phone]  After 6pm go to [email or web address] - Proofreader  [facility]  Office  [phone]  CC: Primary care physician; Dion Body, MD  Note: This dictation was prepared with Dragon dictation along with smaller phrase technology. Any transcriptional errors that result from this process are unintentional.

## 2018-07-03 NOTE — Discharge Summary (Addendum)
Physician Discharge Summary  Patient ID: Kyle Gutierrez MRN: [id number] DOB/AGE: 03-11-1932 84 y.o.  Admit date: 07/02/2018 Discharge date: 07/04/2018  Admission Diagnoses:  full thickness rotator cuff tear left <principal problem not specified>  Discharge Diagnoses:  full thickness rotator cuff tear left Active Problems:   S/P left rotator cuff repair   Total knee replacement status   Past Medical History:  Diagnosis Date  . Benign prostatic hypertrophy    s/p TURP  . Cardiomyopathy ([place])   . Carotid artery plaque    bilateral  . Chronic atrial fibrillation   . Complication of anesthesia    balance problem  . Coronary artery disease, non-occlusive   . Dyspnea   . Dysrhythmia    atrial fib  . Elevated lipids   . Gross hematuria   . Hypertension   . Neuropathy, alcoholic (HCC)    per Neurologic eval, remote  . Nocturia   . Poor balance   . renal cell carcinoma 2002   left kidney cancer with partial nephrectomy.  [facility] Retinopathy    followed by [facility]  . Sleep apnea   . Thrombocytopenia, unspecified ([place])   . Urethral stricture   . Urinary frequency   . Valvular cardiomyopathy (HCC)    severe mitral and tricuspid insufficiency, ECHO July 2012    Surgeries: Procedure(s): SHOULDER ARTHROSCOPY WITH MINI OPEN ROTATOR CUFF REPAIR INCLUDING SUBSCAPULARIS ,SUBACROMINAL DECOMPRESSION. on 07/02/2018   Consultants (if any): Treatment Team:  Loletha Grayer, MD Max Sane, MD  Discharged Condition: Improved  Hospital Course: Kyle Gutierrez is an 84 y.o. male who was admitted 07/02/2018 with a diagnosis of  full thickness rotator cuff tear left <principal problem not specified> and went to the operating room on 07/02/2018 and underwent an uncomplicated right shoulder rotator cuff repair.    He was given perioperative antibiotics:  Anti-infectives (From admission, onward)   Start     Dose/Rate Route Frequency Ordered Stop   07/02/18 1530  ceFAZolin (ANCEF)  IVPB 1 g/50 mL premix     1 g 100 mL/hr over 30 Minutes Intravenous Every 6 hours 07/02/18 1519 07/03/18 0359   07/02/18 0735  ceFAZolin (ANCEF) 2-4 GM/100ML-% IVPB    Note to Pharmacy:  Ronnell Freshwater   : cabinet override      07/02/18 0735 07/02/18 1014   07/02/18 0600  ceFAZolin (ANCEF) IVPB 2g/100 mL premix     2 g 200 mL/hr over 30 Minutes Intravenous On call to O.R. 07/01/18 2216 07/02/18 1014    .Patient was admitted post-op at anesthesias request due to hypotension and decreased O2 saturations in the PACU.   He was seen by the hospitalist service.   His hypotension and O2 saturations improved quickly after surgery but was admitted to observation as a precaution.   Patient had PT post-op and due to generalized weakness he was recommended for SNF.  Patient improved during his hospitalization and was discharged to [facility] for a respite stay for 3 days.  He was discharged on postop day #2.  He was given sequential compression devices, early ambulation, and began Coumadin on the night of surgery.  He is on Coumadin chronically for atrial fibrillation.  He benefited maximally from the hospital stay and there were no complications.    Recent vital signs:  Vitals:   07/04/18 0728 07/04/18 0811  BP: 128/83 131/70  Pulse: 77 76  Resp: 19 18  Temp:  97.7 F (36.5 C)  SpO2: 99% 98%    Recent laboratory studies:  Lab Results  Component Value Date   HGB 12.7 (L) 07/03/2018   HGB 13.0 06/26/2018   HGB 12.0 (L) 06/20/2017   Lab Results  Component Value Date   WBC 11.9 (H) 07/03/2018   PLT 133 (L) 07/03/2018   Lab Results  Component Value Date   INR 1.32 07/04/2018   Lab Results  Component Value Date   NA 134 (L) 07/03/2018   K 4.3 07/03/2018   CL 99 07/03/2018   CO2 25 07/03/2018   BUN 31 (H) 07/03/2018   CREATININE 1.42 (H) 07/03/2018   GLUCOSE 123 (H) 07/03/2018    Discharge Medications:   Allergies as of 07/04/2018      Reactions   Adhesive [tape] Rash,  Other (See Comments)   OK to use paper tape   Latex Rash   Tapentadol Rash      Medication List    TAKE these medications   acetaminophen 500 MG tablet Commonly known as:  TYLENOL Take 1,000 mg by mouth 2 (two) times daily.   docusate sodium 100 MG capsule Commonly known as:  COLACE Take 1 capsule (100 mg total) by mouth 2 (two) times daily.   docusate sodium 100 MG capsule Commonly known as:  COLACE Take 2 capsules (200 mg total) by mouth 2 (two) times daily.   losartan-hydrochlorothiazide 100-12.5 MG tablet Commonly known as:  HYZAAR Take 1 tablet by mouth daily.   metoprolol succinate 25 MG 24 hr tablet Commonly known as:  TOPROL-XL Take 12.5 mg by mouth daily.   ondansetron 4 MG tablet Commonly known as:  ZOFRAN Take 1 tablet (4 mg total) by mouth every 8 (eight) hours as needed for nausea or vomiting.   oxyCODONE 5 MG immediate release tablet Commonly known as:  Oxy IR/ROXICODONE Take 1 tablet (5 mg total) by mouth every 4 (four) hours as needed.   PRESERVISION AREDS 2 PO Take 1 capsule by mouth 2 (two) times daily.   simvastatin 40 MG tablet Commonly known as:  ZOCOR Take 1 tablet (40 mg total) by mouth every morning.   spironolactone 25 MG tablet Commonly known as:  ALDACTONE Take 25 mg by mouth daily.   warfarin 4 MG tablet Commonly known as:  COUMADIN Take 3-4 mg by mouth See admin instructions. Take 4 mg by mouth daily Tuesday and Friday. Take 3 mg by mouth daily on all other days.            Durable Medical Equipment  (From admission, onward)         Start     Ordered   07/04/18 1053  DME 3-in-1  Once     07/04/18 1052          Diagnostic Studies: Dg Chest Port 1 View  Result Date: 07/02/2018 CLINICAL DATA:  Shortness of breath. EXAM: PORTABLE CHEST 1 VIEW COMPARISON:  08/13/2013. FINDINGS: Mediastinum and hilar structures normal. Cardiomegaly with normal pulmonary vascularity. Mild bibasilar subsegmental atelectasis and or scarring.  Small left pleural effusion. IMPRESSION: 1.  Cardiomegaly. 2. Mild bibasilar subsegmental atelectasis and or scarring. Small left pleural effusion. Electronically Signed   By: Marcello Moores  Register   On: 07/02/2018 15:43    Disposition: Discharge disposition: 03-Skilled [place]       Discharge Instructions    Call MD / Call 911   Complete by:  As directed    If you experience chest pain or shortness of breath, CALL 911 and be transported to the hospital emergency room.  If you  develope a fever above 101 F, pus (white drainage) or increased drainage or redness at the wound, or calf pain, call your surgeon's office.   Call MD / Call 911   Complete by:  As directed    If you experience chest pain or shortness of breath, CALL 911 and be transported to the hospital emergency room.  If you develope a fever above 101 F, pus (white drainage) or increased drainage or redness at the wound, or calf pain, call your surgeon's office.   Constipation Prevention   Complete by:  As directed    Drink plenty of fluids.  Prune juice may be helpful.  You may use a stool softener, such as Colace (over the counter) 100 mg twice a day.  Use MiraLax (over the counter) for constipation as needed.   Constipation Prevention   Complete by:  As directed    Drink plenty of fluids.  Prune juice may be helpful.  You may use a stool softener, such as Colace (over the counter) 100 mg twice a day.  Use MiraLax (over the counter) for constipation as needed.   Diet general   Complete by:  As directed    Diet general   Complete by:  As directed    Discharge instructions   Complete by:  As directed    Wear sling at all times, including sleep.  You will need to use the sling for a total of 4 weeks following surgery.  Do not try and lift your arm up or away from your body for any reason.   Keep the dressing dry.  You may remove bandage in 3 days.  Leave the Steri-Strips (white medical tape) in place.  You may place  additional Band-Aids over top of the Steri-Strips if you wish.  May shower once dressing is removed in 3 days.  Remove sling carefully only for showers, leaving arm down by your side while in the shower.   You may be most comfortable sleeping in a recliner.  If you do sleep in near bed, placed pillows behind the shoulder that have the operation to support it.   Discharge instructions   Complete by:  As directed    Wear sling at all times, including sleep.  You will need to use the sling for a total of 4 weeks following surgery.  Do not try and lift your arm up or away from your body for any reason.   Keep the dressing dry.  You may remove bandage in 3 days.  Leave the Steri-Strips (white medical tape) in place.  You may place additional Band-Aids over top of the Steri-Strips if you wish.  May shower once dressing is removed in 3 days.  Remove sling carefully only for showers, leaving arm down by your side while in the shower.  You may be most comfortable sleeping in a recliner.  If you do sleep in near bed, placed pillows behind the shoulder that have the operation to support it.   Driving restrictions   Complete by:  As directed    No driving for 4 weeks   Increase activity slowly as tolerated   Complete by:  As directed    Increase activity slowly as tolerated   Complete by:  As directed    Lifting restrictions   Complete by:  As directed    No lifting for 12-16 weeks      Contact information for after-discharge care    Destination    [facility] .   Service:  Skilled Nursing Contact information: [place] [phone]               Signed: [name] ,MD 07/04/2018, 10:57 AM

## 2018-07-03 NOTE — Progress Notes (Signed)
PT reported that pt's HR was reading in 140s on dinamap while pt was standing. Radial pulse palpated and ranged in 90s, pt denies symptoms. Pt had already received home dose of metoprolol. PT only assisted pt to chair at pt's request. Dr. Manuella Ghazi notified of HR- wanted vitals rechecked in 1 hour (since PT was only approx. 45 minutes after metoprolol given). HR ranged in 70-80s on subsequent checks.

## 2018-07-03 NOTE — Clinical Social Work Note (Addendum)
Clinical Social Work Assessment  Patient Details  Name: Kyle Gutierrez MRN: [id number] Date of Birth: 1932-08-06  Date of referral:  07/03/18               Reason for consult:  Facility Placement                Permission sought to share information with:  Chartered certified accountant granted to share information::  Yes, Verbal Permission Granted  Name::      Kyle Gutierrez::     Relationship::     Contact Information:     Housing/Transportation Living arrangements for the past 2 months:  [place] of Information:  Patient, Facility Patient Interpreter Needed:    Criminal Activity/Legal Involvement Pertinent to Current Situation/Hospitalization:  No - Comment as needed Significant Relationships:  Spouse Lives with:  Spouse Do you feel safe going back to the place where you live?  Yes Need for family participation in patient care:  Yes (Comment)  Care giving concerns:  Patient and his wife Kyle Gutierrez are independent living residents at [facility].    Social Worker assessment / plan:  Holiday representative (CSW) received SNF consult. PT is recommending SNF. CSW met with patient alone at bedside to discuss D/C plan. Patient was alert and oriented X4 and was sitting up in the chair at bedside. CSW introduced self and explained role of CSW department. Per patient he lives with his wife Kyle Gutierrez at [facility] independent living. CSW explained that PT is recommending SNF however patient is under medicare observation and has not had a 3 night qualifying inpatient stay in a hospital in the last 30 days. CSW explained that medicare will not pay for SNF. Per patient he will use his 3 free respite days at [facility]. FL2 complete and faxed out. Per patient he has his own cpap from home.   Per [name] admissions coordinator at [facility] patient can come to [facility] room 229, RN will call report at [phone]. Per [name] patient can use his  3 free respite days however they will not have PT coverage over the weekend at [facility]. CSW made patient aware of above. Patient's wife Kyle Gutierrez is aware of above. CSW will continue to follow and assist as needed.    Employment status:  Retired Forensic scientist:  Medicare PT Recommendations:  [name] / Referral to community resources:  [place]  Patient/Family's Response to care:  Patient is agreeable to D/C to [facility].   Patient/Family's Understanding of and Emotional Response to Diagnosis, Current Treatment, and Prognosis:  Patient was very pleasant and thanked CSW for assistance.   Emotional Assessment Appearance:  Appears stated age Attitude/Demeanor/Rapport:    Affect (typically observed):  Accepting, Adaptable, Pleasant Orientation:  Oriented to Self, Oriented to Place, Oriented to  Time, Oriented to Situation Alcohol / Substance use:  Not Applicable Psych involvement (Current and /or in the community):  No (Comment)  Discharge Needs  Concerns to be addressed:  Discharge Planning Concerns Readmission within the last 30 days:  No Current discharge risk:  Dependent with Mobility Barriers to Discharge:  Continued Medical Work up   [employer], Veronia Beets, LCSW 07/03/2018, 11:51 AM

## 2018-07-03 NOTE — Consult Note (Signed)
ANTICOAGULATION CONSULT NOTE - Follow up [facility] for warfarin Indication: atrial fibrillation  Allergies  Allergen Reactions  . Adhesive [Tape] Rash and Other (See Comments)    OK to use paper tape  . Latex Rash  . Tapentadol Rash    Patient Measurements:     Vital Signs: Temp: 97.8 F (36.6 C) (11/01 0813) Temp Source: Oral (11/01 0813) BP: 136/74 (11/01 0813) Pulse Rate: 63 (11/01 0813)  Labs: Recent Labs    07/02/18 0829 07/03/18 0544  HGB  --  12.7*  HCT  --  40.4  PLT  --  133*  LABPROT 15.4* 14.5  INR 1.23 1.14  CREATININE  --  1.42*    Estimated Creatinine Clearance: 46.4 mL/min (A) (by C-G formula based on SCr of 1.42 mg/dL (H)).   Assessment: Patient is a 84 year old male w/ PMH of afib on warfarin PTA. Underwent elective rotator cuff surgery today. INR this AM was 1.23 since it was held for surgery. Patient is being admitted for post op hypotension/SOB. Pharmacy consulted to restart warfarin. Home dose is 4 mg on Tues and Friday and 3 mg all other days.  Date INR Dose 10/31  1.23 5 mg 11/1 1.14    Goal of Therapy:  INR 2-3 Monitor platelets by anticoagulation protocol: Yes   Plan:  INR has trended down, subtherapeutic - will repeat warfarin 5mg  tonight. May potentially resume pt home dose tomorrow. Follow up INR in AM. Will need CBC at least every three days per policy.   Pharmacy will continue to follow.   Rayna Sexton, PharmD, BCPS Clinical Pharmacist 07/03/2018 9:35 AM

## 2018-07-03 NOTE — Progress Notes (Signed)
Patient declined CPAP 

## 2018-07-03 NOTE — Progress Notes (Signed)
Ok to D/C medically

## 2018-07-03 NOTE — Clinical Social Work Placement (Signed)
   CLINICAL SOCIAL WORK PLACEMENT  NOTE  Date:  07/03/2018  Patient Details  Name: Kyle Gutierrez MRN: [id number] Date of Birth: 04-Feb-1932  Clinical Social Work is seeking post-discharge placement for this patient at the [place] level of care (*CSW will initial, date and re-position this form in  chart as items are completed):  Yes   Patient/family provided with [place] Work Department's list of facilities offering this level of care within the geographic area requested by the patient (or if unable, by the patient's family).  Yes   Patient/family informed of their freedom to choose among providers that offer the needed level of care, that participate in Medicare, Medicaid or managed care program needed by the patient, have an available bed and are willing to accept the patient.  Yes   Patient/family informed of [facility]'s ownership interest in [facility] and [facility], as well as of the fact that they are under no obligation to receive care at these facilities.  PASRR submitted to EDS on       PASRR number received on       Existing PASRR number confirmed on 07/02/18     FL2 transmitted to all facilities in geographic area requested by pt/family on 07/03/18     FL2 transmitted to all facilities within larger geographic area on       Patient informed that his/her managed care company has contracts with or will negotiate with certain facilities, including the following:        Yes   Patient/family informed of bed offers received.  Patient chooses bed at [facility] )     Physician recommends and patient chooses bed at      Patient to be transferred to   on  .  Patient to be transferred to facility by       Patient family notified on   of transfer.  Name of family member notified:        PHYSICIAN       Additional Comment:    _______________________________________________ Caroljean Monsivais, Veronia Beets, LCSW 07/03/2018,  11:50 AM

## 2018-07-04 DIAGNOSIS — I9581 Postprocedural hypotension: Secondary | ICD-10-CM | POA: Diagnosis not present

## 2018-07-04 DIAGNOSIS — G4730 Sleep apnea, unspecified: Secondary | ICD-10-CM | POA: Diagnosis present

## 2018-07-04 DIAGNOSIS — Z9079 Acquired absence of other genital organ(s): Secondary | ICD-10-CM | POA: Diagnosis not present

## 2018-07-04 DIAGNOSIS — Z7901 Long term (current) use of anticoagulants: Secondary | ICD-10-CM | POA: Diagnosis not present

## 2018-07-04 DIAGNOSIS — I428 Other cardiomyopathies: Secondary | ICD-10-CM | POA: Diagnosis present

## 2018-07-04 DIAGNOSIS — I10 Essential (primary) hypertension: Secondary | ICD-10-CM | POA: Diagnosis present

## 2018-07-04 DIAGNOSIS — D696 Thrombocytopenia, unspecified: Secondary | ICD-10-CM | POA: Diagnosis present

## 2018-07-04 DIAGNOSIS — M66812 Spontaneous rupture of other tendons, left shoulder: Secondary | ICD-10-CM | POA: Diagnosis present

## 2018-07-04 DIAGNOSIS — Z96659 Presence of unspecified artificial knee joint: Secondary | ICD-10-CM

## 2018-07-04 DIAGNOSIS — Z96641 Presence of right artificial hip joint: Secondary | ICD-10-CM | POA: Diagnosis present

## 2018-07-04 DIAGNOSIS — M75122 Complete rotator cuff tear or rupture of left shoulder, not specified as traumatic: Secondary | ICD-10-CM | POA: Diagnosis present

## 2018-07-04 DIAGNOSIS — Z87891 Personal history of nicotine dependence: Secondary | ICD-10-CM | POA: Diagnosis not present

## 2018-07-04 DIAGNOSIS — Z85528 Personal history of other malignant neoplasm of kidney: Secondary | ICD-10-CM | POA: Diagnosis not present

## 2018-07-04 DIAGNOSIS — M19012 Primary osteoarthritis, left shoulder: Secondary | ICD-10-CM | POA: Diagnosis present

## 2018-07-04 DIAGNOSIS — I482 Chronic atrial fibrillation, unspecified: Secondary | ICD-10-CM | POA: Diagnosis present

## 2018-07-04 DIAGNOSIS — R0902 Hypoxemia: Secondary | ICD-10-CM | POA: Diagnosis not present

## 2018-07-04 DIAGNOSIS — I2510 Atherosclerotic heart disease of native coronary artery without angina pectoris: Secondary | ICD-10-CM | POA: Diagnosis present

## 2018-07-04 DIAGNOSIS — Z79899 Other long term (current) drug therapy: Secondary | ICD-10-CM | POA: Diagnosis not present

## 2018-07-04 DIAGNOSIS — Z905 Acquired absence of kidney: Secondary | ICD-10-CM | POA: Diagnosis not present

## 2018-07-04 LAB — PROTIME-INR
INR: 1.32
PROTHROMBIN TIME: 16.2 s — AB (ref 11.4–15.2)

## 2018-07-04 MED ORDER — TRAMADOL HCL 50 MG PO TABS
50.0000 mg | ORAL_TABLET | Freq: Once | ORAL | Status: DC
  Filled 2018-07-04: qty 1

## 2018-07-04 MED ORDER — WARFARIN SODIUM 4 MG PO TABS: 4.0000 mg | ORAL_TABLET | ORAL | Status: DC

## 2018-07-04 MED ORDER — WARFARIN SODIUM 3 MG PO TABS
3.0000 mg | ORAL_TABLET | ORAL | Status: DC
  Filled 2018-07-04: qty 1

## 2018-07-04 MED ORDER — DOCUSATE SODIUM 100 MG PO CAPS: 200.0000 mg | ORAL_CAPSULE | Freq: Two times a day (BID) | ORAL | 0 refills | Status: DC

## 2018-07-04 MED ORDER — DOCUSATE SODIUM 100 MG PO CAPS: 200.0000 mg | ORAL_CAPSULE | Freq: Two times a day (BID) | ORAL | Status: DC

## 2018-07-04 NOTE — Progress Notes (Signed)
[facility] discharging [facility]. Report called to [place] at [facility].

## 2018-07-04 NOTE — Clinical Social Work Note (Signed)
The patient will discharge today for respite care at [facility] room 229 via non-emergent EMS after he has had a PT session as recommended by his attending MD. The patient, his wife, and the facility are aware, and they are in agreement. The CSW has sent all paperwork to the facility and will deliver the discharge packet to the patient's chart. The CSW will then sign off. Please consult should needs change.  Santiago Bumpers, MSW, Latanya Presser  [phone]

## 2018-07-04 NOTE — Progress Notes (Addendum)
Physical Therapy Evaluation Patient Details Name: Kyle Gutierrez MRN: [id number] DOB: 11-09-1931 Today's Date: 07/04/2018   History of Present Illness  Pt. is an 84 y.o. male who was admitted to [facility] for acute hospitalization status post Left RTC repair (07/02/18).    Clinical Impression  To edge of bed with increased time and use of rail.  Min assist.  Once sitting, able to sit without support.  Stood with bed raised and min/mod a x 1 for support and balance.  Once standing he relied heavily on cane for support and demonstrated general fear and hesitation with mobility.  Transferred to recliner at bedside.  Session focused on multiple sit to stand transfers and time in standing to gain confidence in upright mobility skills.  Fear and hesitation limiting independence at this time.  Encouragement and education given regarding rehab process and expectations in his recover.  Remained in recliner after session.  HR and O2 within limits this session.    Follow Up Recommendations SNF    Equipment Recommendations       Recommendations for Other Services       Precautions / Restrictions Precautions Precautions: Fall Restrictions Weight Bearing Restrictions: Yes LUE Weight Bearing: Non weight bearing      Mobility  Bed Mobility Overal bed mobility: Needs Assistance Bed Mobility: Supine to Sit              Transfers Overall transfer level: Needs assistance Equipment used: Straight cane Transfers: Sit to/from Stand Sit to Stand: Min assist;Mod assist         General transfer comment: elevated surface  Ambulation/Gait Ambulation/Gait assistance: Min assist Gait Distance (Feet): 3 Feet Assistive device: Straight cane Gait Pattern/deviations: Step-to pattern;Trunk flexed;Decreased step length - right;Decreased step length - left Gait velocity: decreased   General Gait Details: fearful and poor confidence.  short steps reliant on [facility] for support  Stairs             Wheelchair Mobility    Modified Rankin (Stroke Patients Only)       Balance Overall balance assessment: Needs assistance Sitting-balance support: No upper extremity supported;Feet supported Sitting balance-Leahy Scale: Good     Standing balance support: Single extremity supported Standing balance-Leahy Scale: Poor                               Pertinent Vitals/Pain Pain Assessment: 0-10 Pain Score: 4  Pain Location: Left shoulder Pain Descriptors / Indicators: Aching Pain Intervention(s): Limited activity within patient's tolerance;Monitored during session;Premedicated before session    Home Living                        Prior Function                 Hand Dominance        Extremity/Trunk Assessment                Communication      Cognition Arousal/Alertness: Awake/alert Behavior During Therapy: WFL for tasks assessed/performed Overall Cognitive Status: Within Functional Limits for tasks assessed                                        General Comments      Exercises     Assessment/Plan    PT Assessment    PT  Problem List         PT Treatment Interventions      PT Goals (Current goals can be found in the Care Plan section)       Frequency BID   Barriers to discharge        Co-evaluation               AM-PAC PT "6 Clicks" Daily Activity  Outcome Measure Difficulty turning over in bed (including adjusting bedclothes, sheets and blankets)?: A Lot Difficulty moving from lying on back to sitting on the side of the bed? : A Lot Difficulty sitting down on and standing up from a chair with arms (e.g., wheelchair, bedside commode, etc,.)?: Unable Help needed moving to and from a bed to chair (including a wheelchair)?: A Lot Help needed walking in hospital room?: A Lot Help needed climbing 3-5 steps with a railing? : Total 6 Click Score: 10    End of Session Equipment Utilized During  Treatment: Gait belt   Patient left: in chair;with call bell/phone within reach;with chair alarm set Nurse Communication: Mobility status Pain - Right/Left: Left Pain - part of body: Shoulder    Time: 3875-6433 PT Time Calculation (min) (ACUTE ONLY): 23 min   Charges:     PT Treatments $Gait Training: 8-22 mins $Therapeutic Activity: 8-22 mins        Chesley Noon, PTA 07/04/18, 12:41 PM

## 2018-07-04 NOTE — Anesthesia Postprocedure Evaluation (Signed)
Anesthesia Post Note  Patient: Kyle Gutierrez  Procedure(s) Performed: SHOULDER ARTHROSCOPY WITH MINI OPEN ROTATOR CUFF REPAIR INCLUDING SUBSCAPULARIS ,SUBACROMINAL DECOMPRESSION. (Left Shoulder)  Patient location during evaluation: PACU Anesthesia Type: General Level of consciousness: awake and alert and oriented Pain management: pain level controlled Vital Signs Assessment: post-procedure vital signs reviewed and stable Respiratory status: spontaneous breathing Cardiovascular status: blood pressure returned to baseline Anesthetic complications: no Comments: Initial soft BPs in PACU resolved with fluid and light pressor.     Last Vitals:  Vitals:   07/04/18 0728 07/04/18 0811  BP: 128/83 131/70  Pulse: 77 76  Resp: 19 18  Temp:  36.5 C  SpO2: 99% 98%    Last Pain:  Vitals:   07/04/18 1425  TempSrc:   PainSc: 6                  Korey Prashad

## 2018-07-04 NOTE — Progress Notes (Addendum)
[facility] [place] NAME: Kyle Gutierrez    MR#:  [id number]  DATE OF BIRTH:  1932/02/28  SUBJECTIVE:  CHIEF COMPLAINT:  No chief complaint on file.  Pt still has  Pain in arm   REVIEW OF SYSTEMS:  Review of Systems  Constitutional: Negative for chills, fever and weight loss.  HENT: Negative for nosebleeds and sore throat.   Eyes: Negative for blurred vision.  Respiratory: Negative for cough, shortness of breath and wheezing.   Cardiovascular: Negative for chest pain, orthopnea, leg swelling and PND.  Gastrointestinal: Negative for abdominal pain, constipation, diarrhea, heartburn, nausea and vomiting.  Genitourinary: Negative for dysuria and urgency.  Musculoskeletal: Negative for back pain.  Skin: Negative for rash.  Neurological: Negative for dizziness, speech change, focal weakness and headaches.  Endo/Heme/Allergies: Does not bruise/bleed easily.  Psychiatric/Behavioral: Negative for depression.    DRUG ALLERGIES:   Allergies  Allergen Reactions  . Adhesive [Tape] Rash and Other (See Comments)    OK to use paper tape  . Latex Rash  . Tapentadol Rash   VITALS:  Blood pressure 131/70, pulse 76, temperature 97.7 F (36.5 C), temperature source Oral, resp. rate 18, SpO2 98 %. PHYSICAL EXAMINATION:  Physical Exam  Constitutional: He is oriented to person, place, and time.  HENT:  Head: Normocephalic and atraumatic.  Eyes: Pupils are equal, round, and reactive to light. Conjunctivae and EOM are normal.  Neck: Normal range of motion. Neck supple. No tracheal deviation present. No thyromegaly present.  Cardiovascular: Normal rate, regular rhythm and normal heart sounds.  Pulmonary/Chest: Effort normal and breath sounds normal. No respiratory distress. He has no wheezes. He exhibits no tenderness.  Abdominal: Soft. Bowel sounds are normal. He exhibits no distension. There is no tenderness.  Musculoskeletal: Normal range  of motion.  Left arm in sling  Neurological: He is alert and oriented to person, place, and time. No cranial nerve deficit.  Skin: Skin is warm and dry. No rash noted.   LABORATORY PANEL:  Male CBC Recent Labs  Lab 07/03/18 0544  WBC 11.9*  HGB 12.7*  HCT 40.4  PLT 133*   ------------------------------------------------------------------------------------------------------------------ Chemistries  Recent Labs  Lab 07/03/18 0544  NA 134*  K 4.3  CL 99  CO2 25  GLUCOSE 123*  BUN 31*  CREATININE 1.42*  CALCIUM 8.3*   RADIOLOGY:  No results found. ASSESSMENT AND PLAN:   1.  Postoperative hypotension: now resolved. montior bp closely 2.  Postoperative hypoxia: transient and resolved. CXR shows atelectesis and small lt pleural effusion 3.  Chronic atrial fibrillation: continue coumadin inr tremending up 4.  cardiomyopathy and severe mitral regurgitation.  5.  Weakness and falls - rehab today 6.  Patient drinks alcohol on a daily basis. No evidence of withdrawal 7.  Chronic thrombocytopenia 8.  Sleep apnea on BiPAP at night 9.  History of renal cell carcinoma status post partial nephrectomy in 2002.   Ok to d/c from medical standpoint.   All the records are reviewed and case discussed with Care Management/Social Worker. Management plans discussed with the patient, nursing and they are in agreement.  CODE STATUS: Full Code  TOTAL TIME TAKING CARE OF THIS PATIENT: 25 minutes.   More than 50% of the time was spent in counseling/coordination of care: YES  POSSIBLE D/C IN 1-2 DAYS, DEPENDING ON CLINICAL CONDITION.   [name] M.D on 07/04/2018 at 10:58 AM  Between 7am to 6pm - Pager - [phone]  After 6pm go to [email or web address] - Proofreader  [facility]  Office  [phone]  CC: Primary care physician; Dion Body, MD  Note: This dictation was prepared with Dragon dictation along with smaller phrase technology.  Any transcriptional errors that result from this process are unintentional.

## 2018-07-04 NOTE — Consult Note (Addendum)
ANTICOAGULATION CONSULT NOTE - Follow up [facility] for warfarin Indication: atrial fibrillation  Allergies  Allergen Reactions  . Adhesive [Tape] Rash and Other (See Comments)    OK to use paper tape  . Latex Rash  . Tapentadol Rash    Patient Measurements:     Vital Signs: Temp: 97.7 F (36.5 C) (11/02 0811) Temp Source: Oral (11/02 0811) BP: 131/70 (11/02 0811) Pulse Rate: 76 (11/02 0811)  Labs: Recent Labs    07/02/18 0829 07/03/18 0544 07/04/18 0448  HGB  --  12.7*  --   HCT  --  40.4  --   PLT  --  133*  --   LABPROT 15.4* 14.5 16.2*  INR 1.23 1.14 1.32  CREATININE  --  1.42*  --     Estimated Creatinine Clearance: 46.4 mL/min (A) (by C-G formula based on SCr of 1.42 mg/dL (H)).   Assessment: Patient is a 84 year old male w/ PMH of afib on warfarin PTA. Underwent elective rotator cuff surgery today. INR this AM was 1.23 since it was held for surgery. Patient is being admitted for post op hypotension/SOB. Pharmacy consulted to restart warfarin. Home dose is 4 mg on Tues and Friday and 3 mg all other days.  Date INR Dose 10/31  1.23 5 mg 11/1 1.14 5mg  11/2 1.32   Goal of Therapy:  INR 2-3 Monitor platelets by anticoagulation protocol: Yes   Plan:  10/2 INR remains subtherapeutic but starting to trend up. Will restart patient's home regimen today. Follow up INR in AM.  Will need CBC at least every three days per policy.   Pharmacy will continue to follow.   Pernell Dupre, PharmD, BCPS Clinical Pharmacist 07/04/2018 8:54 AM

## 2018-07-13 ENCOUNTER — Telehealth: Payer: Self-pay | Admitting: Licensed Clinical Social Worker

## 2018-07-13 NOTE — Telephone Encounter (Signed)
EMMI flagged patient for answering yes to loss of interest in things. Clinical Education officer, museum (CSW) attempted to contact patient via telephone however he did not answer and a voicemail was left.   [employer], LCSW [phone]

## 2018-07-14 NOTE — Telephone Encounter (Signed)
Clinical Education officer, museum (CSW) attempted to contact patient for a second time via telephone however he did not answer and a voicemail was left.   [employer], LCSW [phone]

## 2018-09-21 ENCOUNTER — Ambulatory Visit: Payer: Medicare Other | Admitting: Podiatry

## 2018-09-24 ENCOUNTER — Encounter: Payer: Self-pay | Admitting: Podiatry

## 2018-09-24 ENCOUNTER — Ambulatory Visit (INDEPENDENT_AMBULATORY_CARE_PROVIDER_SITE_OTHER): Payer: Medicare Other | Admitting: Podiatry

## 2018-09-24 DIAGNOSIS — B351 Tinea unguium: Secondary | ICD-10-CM

## 2018-09-24 DIAGNOSIS — M79609 Pain in unspecified limb: Secondary | ICD-10-CM

## 2018-09-24 DIAGNOSIS — D689 Coagulation defect, unspecified: Secondary | ICD-10-CM

## 2018-09-24 DIAGNOSIS — G629 Polyneuropathy, unspecified: Secondary | ICD-10-CM | POA: Diagnosis not present

## 2018-09-24 NOTE — Progress Notes (Signed)
Complaint:  Visit Type: Patient returns to my office for continued preventative foot care services. Complaint: Patient states" my nails have grown long and thick and become painful to walk and wear shoes"  The patient presents for preventative foot care services. No changes to ROS  Podiatric Exam: Vascular: dorsalis pedis and posterior tibial pulses are palpable bilateral. Capillary return is immediate. Temperature gradient is WNL. Skin turgor WNL  Sensorium: Normal Semmes Weinstein monofilament test. Normal tactile sensation bilaterally. Nail Exam: Pt has thick disfigured discolored nails with subungual debris noted bilateral entire nail hallux through fifth toenails Ulcer Exam: There is no evidence of ulcer or pre-ulcerative changes or infection. Orthopedic Exam: Muscle tone and strength are WNL. No limitations in general ROM. No crepitus or effusions noted. Pes planus  . Bony prominences are unremarkable. Skin: No Porokeratosis. No infection or ulcers  Diagnosis:  Onychomycosis, , Pain in right toe, pain in left toes  Treatment & Plan Procedures and Treatment: Consent by patient was obtained for treatment procedures.   Debridement of mycotic and hypertrophic toenails, 1 through 5 bilateral and clearing of subungual debris. No ulceration, no infection noted.  Return Visit-Office Procedure: Patient instructed to return to the office for a follow up visit 10 weeks  for continued evaluation and treatment.    Gardiner Barefoot DPM

## 2018-12-03 ENCOUNTER — Ambulatory Visit: Payer: Medicare Other | Admitting: Podiatry

## 2018-12-07 ENCOUNTER — Ambulatory Visit: Payer: Medicare Other | Admitting: Podiatry

## 2019-01-11 ENCOUNTER — Other Ambulatory Visit: Payer: Self-pay

## 2019-01-11 ENCOUNTER — Encounter: Payer: Self-pay | Admitting: Podiatry

## 2019-01-11 ENCOUNTER — Ambulatory Visit (INDEPENDENT_AMBULATORY_CARE_PROVIDER_SITE_OTHER): Payer: Medicare Other | Admitting: Podiatry

## 2019-01-11 VITALS — Temp 96.2°F

## 2019-01-11 DIAGNOSIS — B351 Tinea unguium: Secondary | ICD-10-CM | POA: Diagnosis not present

## 2019-01-11 DIAGNOSIS — M719 Bursopathy, unspecified: Secondary | ICD-10-CM | POA: Insufficient documentation

## 2019-01-11 DIAGNOSIS — M75120 Complete rotator cuff tear or rupture of unspecified shoulder, not specified as traumatic: Secondary | ICD-10-CM | POA: Insufficient documentation

## 2019-01-11 DIAGNOSIS — M79609 Pain in unspecified limb: Secondary | ICD-10-CM

## 2019-01-11 DIAGNOSIS — D689 Coagulation defect, unspecified: Secondary | ICD-10-CM

## 2019-01-11 DIAGNOSIS — G629 Polyneuropathy, unspecified: Secondary | ICD-10-CM

## 2019-01-11 NOTE — Progress Notes (Signed)
Complaint:  Visit Type: Patient returns to my office for continued preventative foot care services. Complaint: Patient states" my nails have grown long and thick and become painful to walk and wear shoes"  The patient presents for preventative foot care services. No changes to ROS.  Patient is taking coumadin  Podiatric Exam: Vascular: dorsalis pedis and posterior tibial pulses are palpable bilateral. Capillary return is immediate. Temperature gradient is WNL. Skin turgor WNL  Sensorium: Normal Semmes Weinstein monofilament test. Normal tactile sensation bilaterally. Nail Exam: Pt has thick disfigured discolored nails with subungual debris noted bilateral entire nail hallux through fifth toenails Ulcer Exam: There is no evidence of ulcer or pre-ulcerative changes or infection. Orthopedic Exam: Muscle tone and strength are WNL. No limitations in general ROM. No crepitus or effusions noted. Pes planus  . Bony prominences are unremarkable. Skin: No Porokeratosis. No infection or ulcers  Diagnosis:  Onychomycosis, , Pain in right toe, pain in left toes  Treatment & Plan Procedures and Treatment: Consent by patient was obtained for treatment procedures.   Debridement of mycotic and hypertrophic toenails, 1 through 5 bilateral and clearing of subungual debris. No ulceration, no infection noted.  Return Visit-Office Procedure: Patient instructed to return to the office for a follow up visit 10 weeks  for continued evaluation and treatment.    Gardiner Barefoot DPM

## 2019-03-22 ENCOUNTER — Ambulatory Visit: Payer: Medicare Other | Admitting: Podiatry

## 2019-03-25 ENCOUNTER — Other Ambulatory Visit: Payer: Self-pay

## 2019-03-25 ENCOUNTER — Encounter: Payer: Self-pay | Admitting: Podiatry

## 2019-03-25 ENCOUNTER — Ambulatory Visit (INDEPENDENT_AMBULATORY_CARE_PROVIDER_SITE_OTHER): Payer: Medicare Other | Admitting: Podiatry

## 2019-03-25 VITALS — Temp 98.1°F

## 2019-03-25 DIAGNOSIS — D689 Coagulation defect, unspecified: Secondary | ICD-10-CM

## 2019-03-25 DIAGNOSIS — G629 Polyneuropathy, unspecified: Secondary | ICD-10-CM

## 2019-03-25 DIAGNOSIS — M79676 Pain in unspecified toe(s): Secondary | ICD-10-CM

## 2019-03-25 DIAGNOSIS — B351 Tinea unguium: Secondary | ICD-10-CM | POA: Diagnosis not present

## 2019-03-25 NOTE — Progress Notes (Signed)
Complaint:  Visit Type: Patient returns to my office for continued preventative foot care services. Complaint: Patient states" my nails have grown long and thick and become painful to walk and wear shoes"  The patient presents for preventative foot care services. No changes to ROS.  Patient is taking coumadin  Podiatric Exam: Vascular: dorsalis pedis and posterior tibial pulses are palpable bilateral. Capillary return is immediate. Temperature gradient is WNL. Skin turgor WNL  Sensorium: Normal Semmes Weinstein monofilament test. Normal tactile sensation bilaterally. Nail Exam: Pt has thick disfigured discolored nails with subungual debris noted bilateral entire nail hallux through fifth toenails Ulcer Exam: There is no evidence of ulcer or pre-ulcerative changes or infection. Orthopedic Exam: Muscle tone and strength are WNL. No limitations in general ROM. No crepitus or effusions noted. Pes planus  . Bony prominences are unremarkable. Skin: No Porokeratosis. No infection or ulcers  Diagnosis:  Onychomycosis, , Pain in right toe, pain in left toes  Treatment & Plan Procedures and Treatment: Consent by patient was obtained for treatment procedures.   Debridement of mycotic and hypertrophic toenails, 1 through 5 bilateral and clearing of subungual debris. No ulceration, no infection noted.  Return Visit-Office Procedure: Patient instructed to return to the office for a follow up visit 10 weeks  for continued evaluation and treatment.    Gardiner Barefoot DPM

## 2019-06-03 ENCOUNTER — Encounter: Payer: Self-pay | Admitting: Podiatry

## 2019-06-03 ENCOUNTER — Other Ambulatory Visit: Payer: Self-pay

## 2019-06-03 ENCOUNTER — Ambulatory Visit (INDEPENDENT_AMBULATORY_CARE_PROVIDER_SITE_OTHER): Payer: Medicare Other | Admitting: Podiatry

## 2019-06-03 DIAGNOSIS — M79676 Pain in unspecified toe(s): Secondary | ICD-10-CM

## 2019-06-03 DIAGNOSIS — M79609 Pain in unspecified limb: Secondary | ICD-10-CM

## 2019-06-03 DIAGNOSIS — B351 Tinea unguium: Secondary | ICD-10-CM | POA: Diagnosis not present

## 2019-06-03 DIAGNOSIS — D689 Coagulation defect, unspecified: Secondary | ICD-10-CM

## 2019-06-03 NOTE — Progress Notes (Signed)
Complaint:  Visit Type: Patient returns to my office for continued preventative foot care services. Complaint: Patient states" my nails have grown long and thick and become painful to walk and wear shoes"  The patient presents for preventative foot care services. No changes to ROS.  Patient is taking coumadin  Podiatric Exam: Vascular: dorsalis pedis and posterior tibial pulses are palpable bilateral. Capillary return is immediate. Temperature gradient is WNL. Skin turgor WNL  Sensorium: Normal Semmes Weinstein monofilament test. Normal tactile sensation bilaterally. Nail Exam: Pt has thick disfigured discolored nails with subungual debris noted bilateral entire nail hallux through fifth toenails Ulcer Exam: There is no evidence of ulcer or pre-ulcerative changes or infection. Orthopedic Exam: Muscle tone and strength are WNL. No limitations in general ROM. No crepitus or effusions noted. Pes planus  . Bony prominences are unremarkable. Skin: No Porokeratosis. No infection or ulcers  Diagnosis:  Onychomycosis, , Pain in right toe, pain in left toes  Treatment & Plan Procedures and Treatment: Consent by patient was obtained for treatment procedures.   Debridement of mycotic and hypertrophic toenails, 1 through 5 bilateral and clearing of subungual debris. No ulceration, no infection noted.  Return Visit-Office Procedure: Patient instructed to return to the office for a follow up visit 10 weeks  for continued evaluation and treatment.    Gardiner Barefoot DPM

## 2019-07-13 DIAGNOSIS — N1831 Chronic kidney disease, stage 3a: Secondary | ICD-10-CM | POA: Insufficient documentation

## 2019-08-12 ENCOUNTER — Ambulatory Visit: Payer: Medicare Other | Admitting: Podiatry

## 2020-01-11 ENCOUNTER — Other Ambulatory Visit: Payer: Self-pay

## 2020-01-11 ENCOUNTER — Ambulatory Visit: Payer: Medicare Other | Admitting: Nurse Practitioner

## 2020-01-11 ENCOUNTER — Encounter: Payer: Self-pay | Admitting: Nurse Practitioner

## 2020-01-11 VITALS — BP 120/70 | HR 82 | Temp 97.3°F | Ht 72.0 in | Wt 218.0 lb

## 2020-01-11 DIAGNOSIS — T148XXA Other injury of unspecified body region, initial encounter: Secondary | ICD-10-CM

## 2020-01-11 MED ORDER — MUPIROCIN 2 % EX OINT: 1.0000 "application " | TOPICAL_OINTMENT | Freq: Two times a day (BID) | CUTANEOUS | 0 refills | Status: DC

## 2020-01-11 NOTE — Patient Instructions (Signed)
To apply antibiotic ointment twice daily Make sure area is clean and dry before applying  Do not leave wet bandage on area- take off when showering and then apply ointment and cover with bandage.

## 2020-01-11 NOTE — Progress Notes (Signed)
Careteam: Patient Care Team: Dion Body, MD as PCP - General (Family Medicine)  PLACE OF SERVICE:  [place]  Advanced Directive information    Allergies  Allergen Reactions  . Latex Rash  . Tape Rash and Other (See Comments)    OK to use paper tape OK to use paper tape  . Tapentadol Rash    Chief Complaint  Patient presents with  . Acute Visit    Left arm injury with possible infection.     HPI: Patient is a 86 y.o. male for wound evaluation. Pt had tripped going through a door way and hit is elbow causing an abrasion, happened a week ago.  He had been following with the [place] nurse and she was concerned for infection. Apparently he had left a dressing on for several days which was saturated at time of visit with her and had a foul odor.  Reports today the tenderness has significantly improved and redness has improved.    Review of Systems:  Review of Systems  Constitutional: Negative for chills, fever and malaise/fatigue.  Musculoskeletal: Negative for joint pain and myalgias.  Skin: Negative for itching and rash.    Past Medical History:  Diagnosis Date  . Benign prostatic hypertrophy    s/p TURP  . Cardiomyopathy ([place])   . Carotid artery plaque    bilateral  . Chronic atrial fibrillation ([place])   . Complication of anesthesia    balance problem  . Coronary artery disease, non-occlusive   . Dyspnea   . Dysrhythmia    atrial fib  . Elevated lipids   . Gross hematuria   . Hypertension   . Neuropathy, alcoholic (H[facility])    per Neurologic eval, remote  . Nocturia   . Poor balance   . renal cell carcinoma 2002   left kidney cancer with partial nephrectomy.  [facility] Retinopathy    followed by [facility]  . Sleep apnea   . Thrombocytopenia, unspecified ([place])   . Urethral stricture   . Urinary frequency   . Valvular cardiomyopathy (H[facility])    severe mitral and tricuspid insufficiency, ECHO July 2012   Past Surgical History:  Procedure Laterality  Date  . CYSTOSCOPY W/ RETROGRADES N/A 05/06/2013   Procedure: CYSTOSCOPY WITH RETROGRADE PYELOGRAM,  BALLOON DILATION OF URETHRAL STRICTURE;  Surgeon: Dutch Gray, MD;  Location: [facility] ORS;  Service: Urology;  Laterality: N/A;  . EYE SURGERY     bilateral cataracts removed  . left rotator cuff repair    . PARTIAL NEPHRECTOMY Left    10'02  . PROSTATE SURGERY    . ROTATOR CUFF REPAIR Right   . SHOULDER ARTHROSCOPY WITH OPEN ROTATOR CUFF REPAIR Left 07/02/2018   Procedure: SHOULDER ARTHROSCOPY WITH MINI OPEN ROTATOR CUFF REPAIR INCLUDING SUBSCAPULARIS ,SUBACROMINAL DECOMPRESSION.;  Surgeon: Thornton Park, MD;  Location: [facility] ORS;  Service: Orthopedics;  Laterality: Left;  . TOTAL HIP ARTHROPLASTY Right 06/19/2017   Procedure: TOTAL HIP ARTHROPLASTY ANTERIOR APPROACH;  Surgeon: Hessie Knows, MD;  Location: [facility] ORS;  Service: Orthopedics;  Laterality: Right;  . TRANSURETHRAL RESECTION OF PROSTATE  2003   Social History:   reports that he quit smoking about 42 years ago. He has never used smokeless tobacco. He reports current alcohol use. He reports that he does not use drugs.  Family History  Problem Relation Age of Onset  . Heart disease Mother 36       heart failure  . Early death Father 59  . Heart disease Father  CAD  . Cancer Sister        Breast    Medications: Patient's Medications  New Prescriptions   No medications on file  Previous Medications   ACETAMINOPHEN (TYLENOL PO)    Tylenol   ACETAMINOPHEN (TYLENOL) 500 MG TABLET    Take 1,000 mg by mouth 2 (two) times daily.    AMOXICILLIN (AMOXIL) 500 MG TABLET       DOCUSATE SODIUM (COLACE) 100 MG CAPSULE    Take 1 capsule (100 mg total) by mouth 2 (two) times daily.   DOCUSATE SODIUM (COLACE) 100 MG CAPSULE    Take 2 capsules (200 mg total) by mouth 2 (two) times daily.   FLUTICASONE (FLONASE) 50 MCG/ACT NASAL SPRAY    fluticasone propionate 50 mcg/actuation nasal spray,suspension   LOSARTAN-HYDROCHLOROTHIAZIDE  (HYZAAR) 100-12.5 MG PER TABLET    Take 1 tablet by mouth daily.    METOPROLOL SUCCINATE (TOPROL-XL) 25 MG 24 HR TABLET    Take 12.5 mg by mouth daily.    MULTIPLE VITAMINS-MINERALS (PRESERVISION AREDS 2 PO)    Take 1 capsule by mouth 2 (two) times daily.   ONDANSETRON (ZOFRAN) 4 MG TABLET    Take 1 tablet (4 mg total) by mouth every 8 (eight) hours as needed for nausea or vomiting.   OXYBUTYNIN (DITROPAN-XL) 5 MG 24 HR TABLET    oxybutynin chloride ER 5 mg tablet,extended release 24 hr   OXYCODONE (OXY IR/ROXICODONE) 5 MG IMMEDIATE RELEASE TABLET    Take 1 tablet (5 mg total) by mouth every 4 (four) hours as needed.   SIMVASTATIN (ZOCOR) 40 MG TABLET    Take 1 tablet (40 mg total) by mouth every morning.   SPIRONOLACTONE (ALDACTONE) 25 MG TABLET    Take 25 mg by mouth daily.   TRAMADOL (ULTRAM) 50 MG TABLET    tramadol 50 mg tablet   WARFARIN (COUMADIN) 3 MG TABLET    TAKE 1 TABLET (3 MG TOTAL) BY MOUTH AS DIRECTED  Modified Medications   No medications on file  Discontinued Medications   No medications on file    Physical Exam:  Vitals:   01/11/20 1304  BP: 120/70  Pulse: 82  Temp: (!) 97.3 F (36.3 C)  TempSrc: Temporal  SpO2: 99%  Weight: 218 lb (98.9 kg)  Height: 6' (1.829 m)   Body mass index is 29.57 kg/m. Wt Readings from Last 3 Encounters:  01/11/20 218 lb (98.9 kg)  06/26/18 210 lb (95.3 kg)  06/19/17 206 lb 12.8 oz (93.8 kg)    Physical Exam Constitutional:      Appearance: Normal appearance.  Musculoskeletal:     Cervical back: Normal range of motion.  Skin:    General: Skin is warm and dry.     Findings: Abrasion present.     Comments: 2 linear abrasions noted to right lateral elbow, abrasions noted on lipoma per pt. No tenderness on exam. Mild erythema noted around area. Without drainage.   Neurological:     Mental Status: He is alert.     Labs reviewed: Basic Metabolic Panel: No results for input(s): NA, K, CL, CO2, GLUCOSE, BUN, CREATININE,  CALCIUM, MG, PHOS, TSH in the last 8760 hours. Liver Function Tests: No results for input(s): AST, ALT, ALKPHOS, BILITOT, PROT, ALBUMIN in the last 8760 hours. No results for input(s): LIPASE, AMYLASE in the last 8760 hours. No results for input(s): AMMONIA in the last 8760 hours. CBC: No results for input(s): WBC, NEUTROABS, HGB, HCT, MCV, PLT in the  last 8760 hours. Lipid Panel: No results for input(s): CHOL, HDL, LDLCALC, TRIG, CHOLHDL, LDLDIRECT in the last 8760 hours. TSH: No results for input(s): TSH in the last 8760 hours. A1C: Lab Results  Component Value Date   HGBA1C 5.4 01/05/2009     Assessment/Plan 1. Abrasion of skin -pt has been bathing with dressing on, suspect that was what lead to the foul odor, no odor at this this time or drainage. Reports tenderness has improved.  -due to hx of redness with tenderness will have him apply mupirocin ointment twice daily and cover with bandage until healed.  -to notify if area becomes red, swollen, tender or warm - mupirocin ointment (BACTROBAN) 2 %; Apply 1 application topically 2 (two) times daily.  Dispense: 22 g; Refill: 0   Marciana Uplinger K. [place], [facility] [phone]

## 2020-01-12 ENCOUNTER — Other Ambulatory Visit: Payer: Self-pay

## 2020-01-12 ENCOUNTER — Ambulatory Visit (INDEPENDENT_AMBULATORY_CARE_PROVIDER_SITE_OTHER): Payer: Medicare Other | Admitting: Podiatry

## 2020-01-12 ENCOUNTER — Encounter: Payer: Self-pay | Admitting: Podiatry

## 2020-01-12 DIAGNOSIS — L97521 Non-pressure chronic ulcer of other part of left foot limited to breakdown of skin: Secondary | ICD-10-CM

## 2020-01-12 DIAGNOSIS — M79676 Pain in unspecified toe(s): Secondary | ICD-10-CM

## 2020-01-12 DIAGNOSIS — B351 Tinea unguium: Secondary | ICD-10-CM

## 2020-01-12 NOTE — Progress Notes (Signed)
He presents today chief complaint of painful ingrown toenail margins hallux bilaterally.  States that a few weeks ago he wore shoes that were too short for him and rubbed blisters on the tips of his toes his right hallux has returned black where the blister was and he continues to have a small ulceration to the distal aspect of the hallux left.  His toenails are sharply incurvated and tender.  Objective: Vital signs are stable he is alert oriented x3 I debrided these margins out for him today without any complications.  He has very little pain since he has neuropathy so trim them for him.  Also debrided the ulceration to the tip of the toe then placed silver nitrate to be left on for the next couple of days without being washed off.  We did discuss watching it for any signs of infection.  Assessment: Ulceration to the tip of the toe hallux left resolved ulceration tip of the toe hallux right.  Ingrown toenails hallux bilaterally.  Plan: Debrided the two toenails and debrided the ulceration placed silver nitrate and I will follow-up with him in about 2 weeks just to make sure is he is healing well.

## 2020-02-02 ENCOUNTER — Encounter: Payer: Self-pay | Admitting: Podiatry

## 2020-02-02 ENCOUNTER — Other Ambulatory Visit: Payer: Self-pay

## 2020-02-02 ENCOUNTER — Ambulatory Visit (INDEPENDENT_AMBULATORY_CARE_PROVIDER_SITE_OTHER): Payer: Medicare Other | Admitting: Podiatry

## 2020-02-02 DIAGNOSIS — L97521 Non-pressure chronic ulcer of other part of left foot limited to breakdown of skin: Secondary | ICD-10-CM | POA: Diagnosis not present

## 2020-02-02 NOTE — Progress Notes (Signed)
He presents today for follow-up of ulceration to the tip of the hallux bilateral left greater than right.  He states that both doing much better almost cancel my appointment.  Objective: Vital signs are stable he is alert and oriented x3.  Pulses are palpable.  Considerable venous insufficiency bilateral lower extremities with very thin skin.  Ulcerations have gone on to heal nearly 100% on the left and 100% on the right.  There is no open lesions or wounds anywhere else on the feet.  No signs of infection.  Assessment: Well-healing ulcerations hallux bilateral.  Plan: Follow-up with me on an as-needed basis instructed him not to wear the shoes any longer.

## 2020-03-16 ENCOUNTER — Ambulatory Visit (INDEPENDENT_AMBULATORY_CARE_PROVIDER_SITE_OTHER): Payer: Medicare Other | Admitting: Podiatry

## 2020-03-16 ENCOUNTER — Other Ambulatory Visit: Payer: Self-pay

## 2020-03-16 ENCOUNTER — Encounter: Payer: Self-pay | Admitting: Podiatry

## 2020-03-16 DIAGNOSIS — G629 Polyneuropathy, unspecified: Secondary | ICD-10-CM | POA: Diagnosis not present

## 2020-03-16 DIAGNOSIS — B351 Tinea unguium: Secondary | ICD-10-CM | POA: Diagnosis not present

## 2020-03-16 DIAGNOSIS — M79609 Pain in unspecified limb: Secondary | ICD-10-CM | POA: Diagnosis not present

## 2020-03-16 DIAGNOSIS — D689 Coagulation defect, unspecified: Secondary | ICD-10-CM | POA: Diagnosis not present

## 2020-03-16 NOTE — Progress Notes (Signed)
This patient returns to my office for at risk foot care.  This patient requires this care by a professional since this patient will be at risk due to having stage 3a chronic kidney disease and coagulation defect.  Patient is taking coumadin. This patient is unable to cut nails himself since the patient cannot reach his nails.These nails are painful walking and wearing shoes.  This patient presents for at risk foot care today.  General Appearance  Alert, conversant and in no acute stress.  Vascular  Dorsalis pedis and posterior tibial  pulses are palpable  bilaterally.  Capillary return is within normal limits  bilaterally. Temperature is within normal limits  bilaterally.  Neurologic  Senn-Weinstein monofilament wire test within normal limits  bilaterally. Muscle power within normal limits bilaterally.  Nails Thick disfigured discolored nails with subungual debris  from hallux to fifth toes bilaterally. No evidence of bacterial infection or drainage bilaterally.  Orthopedic  No limitations of motion  feet .  No crepitus or effusions noted.  No bony pathology or digital deformities noted.  Skin  normotropic skin with no porokeratosis noted bilaterally.  No signs of infections or ulcers noted.     Onychomycosis  Pain in right toes  Pain in left toes  Consent was obtained for treatment procedures.   Mechanical debridement of nails 1-5  bilaterally performed with a nail nipper.  Filed with dremel without incident.    Return office visit    10 weeks                  Told patient to return for periodic foot care and evaluation due to potential at risk complications.   Gardiner Barefoot DPM

## 2020-05-25 ENCOUNTER — Ambulatory Visit: Payer: Medicare Other | Admitting: Podiatry

## 2020-05-29 ENCOUNTER — Encounter: Payer: Self-pay | Admitting: Podiatry

## 2020-05-29 ENCOUNTER — Other Ambulatory Visit: Payer: Self-pay

## 2020-05-29 ENCOUNTER — Ambulatory Visit (INDEPENDENT_AMBULATORY_CARE_PROVIDER_SITE_OTHER): Payer: Medicare Other | Admitting: Podiatry

## 2020-05-29 DIAGNOSIS — D689 Coagulation defect, unspecified: Secondary | ICD-10-CM

## 2020-05-29 DIAGNOSIS — G629 Polyneuropathy, unspecified: Secondary | ICD-10-CM | POA: Diagnosis not present

## 2020-05-29 DIAGNOSIS — B351 Tinea unguium: Secondary | ICD-10-CM

## 2020-05-29 DIAGNOSIS — M79609 Pain in unspecified limb: Secondary | ICD-10-CM | POA: Diagnosis not present

## 2020-05-29 NOTE — Progress Notes (Signed)
This patient returns to my office for at risk foot care.  This patient requires this care by a professional since this patient will be at risk due to having stage 3a chronic kidney disease and coagulation defect.  Patient is taking coumadin.  This patient is unable to cut nails himself since the patient cannot reach his nails.These nails are painful walking and wearing shoes.  This patient presents for at risk foot care today.  General Appearance  Alert, conversant and in no acute stress.  Vascular  Dorsalis pedis and posterior tibial  pulses are palpable  bilaterally.  Capillary return is within normal limits  bilaterally. Temperature is within normal limits  bilaterally.  Neurologic  Senn-Weinstein monofilament wire test within normal limits  bilaterally. Muscle power within normal limits bilaterally.  Nails Thick disfigured discolored nails with subungual debris  from hallux to fifth toes bilaterally. No evidence of bacterial infection or drainage bilaterally.  Orthopedic  No limitations of motion  feet .  No crepitus or effusions noted.  No bony pathology or digital deformities noted.  Skin  normotropic skin with no porokeratosis noted bilaterally.  No signs of infections or ulcers noted.     Onychomycosis  Pain in right toes  Pain in left toes  Consent was obtained for treatment procedures.   Mechanical debridement of nails 1-5  bilaterally performed with a nail nipper.  Filed with dremel without incident.    Return office visit    11 weeks                  Told patient to return for periodic foot care and evaluation due to potential at risk complications.   Gardiner Barefoot DPM

## 2020-07-24 ENCOUNTER — Ambulatory Visit (INDEPENDENT_AMBULATORY_CARE_PROVIDER_SITE_OTHER): Payer: Medicare Other | Admitting: Urology

## 2020-07-24 ENCOUNTER — Other Ambulatory Visit: Payer: Self-pay

## 2020-07-24 ENCOUNTER — Encounter: Payer: Self-pay | Admitting: Urology

## 2020-07-24 VITALS — BP 132/68 | HR 71 | Wt 218.0 lb

## 2020-07-24 DIAGNOSIS — R310 Gross hematuria: Secondary | ICD-10-CM | POA: Diagnosis not present

## 2020-07-24 NOTE — Progress Notes (Signed)
07/24/2020 10:37 AM   Kyle Gutierrez [birth year] [id number]  Referring provider: Pricilla Larsson, NP [address] [place],  [place] [address]  Chief Complaint  Patient presents with  . Hematuria    HPI: Patient last seen 5 years ago.  He has been dilated for stricture disease.  He has had a left partial nephrectomy in 2002 for renal cell carcinoma.  He has had a transurethral resection of the prostate and his residual 5 years ago was 37 mL.  Primary care referred the patient because he reported to them that once or twice a year for less than 24 hours she will have blood in the urine.  He does take Coumadin for last 25 years  At baseline he voids every 2 hours and gets up once or twice a night.  Flow is reasonable.  He generally feels empty.  Intermittently he may have mild sprain.  He has a smoking history     PMH: Past Medical History:  Diagnosis Date  . Benign prostatic hypertrophy    s/p TURP  . Cardiomyopathy ([facility])   . Carotid artery plaque    bilateral  . Chronic atrial fibrillation ([facility])   . Complication of anesthesia    balance problem  . Coronary artery disease, non-occlusive   . Dyspnea   . Dysrhythmia    atrial fib  . Elevated lipids   . Gross hematuria   . Hypertension   . Neuropathy, alcoholic ([facility])    per Neurologic eval, remote  . Nocturia   . Poor balance   . renal cell carcinoma 2002   left kidney cancer with partial nephrectomy.  Marland Kitchen Retinopathy    followed by [facility]  . Sleep apnea   . Thrombocytopenia, unspecified ([facility])   . Urethral stricture   . Urinary frequency   . Valvular cardiomyopathy ([facility])    severe mitral and tricuspid insufficiency, ECHO July 2012    Surgical History: Past Surgical History:  Procedure Laterality Date  . CYSTOSCOPY W/ RETROGRADES N/A 05/06/2013   Procedure: CYSTOSCOPY WITH RETROGRADE PYELOGRAM,  BALLOON DILATION OF URETHRAL STRICTURE;  Surgeon: Dutch Gray, MD;  Location: [facility];  Service: Urology;   Laterality: N/A;  . EYE SURGERY     bilateral cataracts removed  . left rotator cuff repair    . PARTIAL NEPHRECTOMY Left    10'02  . PROSTATE SURGERY    . ROTATOR CUFF REPAIR Right   . SHOULDER ARTHROSCOPY WITH OPEN ROTATOR CUFF REPAIR Left 07/02/2018   Procedure: SHOULDER ARTHROSCOPY WITH MINI OPEN ROTATOR CUFF REPAIR INCLUDING SUBSCAPULARIS ,SUBACROMINAL DECOMPRESSION.;  Surgeon: Thornton Park, MD;  Location: [facility];  Service: Orthopedics;  Laterality: Left;  . TOTAL HIP ARTHROPLASTY Right 06/19/2017   Procedure: TOTAL HIP ARTHROPLASTY ANTERIOR APPROACH;  Surgeon: Hessie Knows, MD;  Location: [facility];  Service: Orthopedics;  Laterality: Right;  . TRANSURETHRAL RESECTION OF PROSTATE  2003    Home Medications:  Allergies as of 07/24/2020      Reactions   Latex Rash   Tape Rash, Other (See Comments)   OK to use paper tape OK to use paper tape   Tapentadol Rash      Medication List       Accurate as of July 24, 2020 10:37 AM. If you have any questions, ask your nurse or doctor.        acetaminophen 500 MG tablet Commonly known as: TYLENOL Take 1,000 mg by mouth 2 (two) times daily.   losartan-hydrochlorothiazide 100-12.5 MG tablet  Commonly known as: HYZAAR Take 1 tablet by mouth daily.   metoprolol succinate 25 MG 24 hr tablet Commonly known as: TOPROL-XL Take 12.5 mg by mouth daily.   mupirocin ointment 2 % Commonly known as: Bactroban Apply 1 application topically 2 (two) times daily.   PRESERVISION AREDS 2 PO Take 1 capsule by mouth 2 (two) times daily.   simvastatin 40 MG tablet Commonly known as: ZOCOR Take 1 tablet (40 mg total) by mouth every morning.   spironolactone 25 MG tablet Commonly known as: ALDACTONE Take 1 tablet by mouth daily.   warfarin 2 MG tablet Commonly known as: COUMADIN Take by mouth.       Allergies:  Allergies  Allergen Reactions  . Latex Rash  . Tape Rash and Other (See Comments)    OK to use paper tape OK  to use paper tape  . Tapentadol Rash    Family History: Family History  Problem Relation Age of Onset  . Heart disease Mother 27       heart failure  . Early death Father 68  . Heart disease Father        CAD  . Cancer Sister        Breast    Social History:  reports that he quit smoking about 43 years ago. He has never used smokeless tobacco. He reports current alcohol use. He reports that he does not use drugs.  ROS:                                        Physical Exam: There were no vitals taken for this visit.  Constitutional:  Alert and oriented, No acute distress. HEENT: [place] AT, moist mucus membranes.  Trachea midline, no masses. Cardiovascular: No clubbing, cyanosis, or edema. Respiratory: Normal respiratory effort, no increased work of breathing. GI: Abdomen is soft, nontender, nondistended, no abdominal masses GU: 50 g benign prostate Skin: No rashes, bruises or suspicious lesions. Lymph: No cervical or inguinal adenopathy. Neurologic: Grossly intact, no focal deficits, moving all 4 extremities. Psychiatric: Normal mood and affect.  Laboratory Data: Lab Results  Component Value Date   WBC 11.9 (H) 07/03/2018   HGB 12.7 (L) 07/03/2018   HCT 40.4 07/03/2018   MCV 97.8 07/03/2018   PLT 133 (L) 07/03/2018    Lab Results  Component Value Date   CREATININE 1.42 (H) 07/03/2018    Lab Results  Component Value Date   PSA 1.0 02/13/2012   PSA 0.82 02/28/2009    No results found for: TESTOSTERONE  Lab Results  Component Value Date   HGBA1C 5.4 01/05/2009    Urinalysis    Component Value Date/Time   COLORURINE YELLOW (A) 06/04/2017 1516   APPEARANCEUR CLEAR (A) 06/04/2017 1516   APPEARANCEUR Clear 03/19/2014 1032   APPEARANCEUR Clear 02/13/2012 0833   LABSPEC 1.015 06/04/2017 1516   LABSPEC 1.009 03/19/2014 1032   PHURINE 6.0 06/04/2017 1516   GLUCOSEU NEGATIVE 06/04/2017 1516   GLUCOSEU Negative 03/19/2014 1032   GLUCOSEU  NEGATIVE 08/11/2012 1000   HGBUR NEGATIVE 06/04/2017 1516   BILIRUBINUR NEGATIVE 06/04/2017 1516   BILIRUBINUR Negative 03/19/2014 1032   KETONESUR NEGATIVE 06/04/2017 1516   PROTEINUR NEGATIVE 06/04/2017 1516   UROBILINOGEN 0.2 08/11/2012 1000   NITRITE NEGATIVE 06/04/2017 1516   LEUKOCYTESUR NEGATIVE 06/04/2017 1516   LEUKOCYTESUR Negative 03/19/2014 1032    Pertinent Imaging: Chart reviewed.  No  recent x-rays.  Urine reviewed and no blood in urine.  Urine sent for culture.  Assessment & Plan: Patient has a history of gross hematuria.  I will order a CT scan.  He has a distant history of renal cell carcinoma.  I will have him come back for cystoscopy.  If he has a stricture and he ever needed dilation I would asked my partner to do this just in case he had a bladder cancer that need to be treated simultaneously under anesthesia.  I think he went through a lot in 2016 when he had a stricture dilated under anesthesia and he said he does not want to go through that again  1. Hematuria, gross  - Urinalysis, Complete   No follow-ups on file.  Reece Packer, MD  [facility] [address] [place], [place] [address] [phone]

## 2020-07-24 NOTE — Patient Instructions (Signed)
Cystoscopy Cystoscopy is a procedure that is used to help diagnose and sometimes treat conditions that affect the lower urinary tract. The lower urinary tract includes the bladder and the urethra. The urethra is the tube that drains urine from the bladder. Cystoscopy is done using a thin, tube-shaped instrument with a light and camera at the end (cystoscope). The cystoscope may be hard or flexible, depending on the goal of the procedure. The cystoscope is inserted through the urethra, into the bladder. Cystoscopy may be recommended if you have:  Urinary tract infections that keep coming back.  Blood in the urine (hematuria).  An inability to control when you urinate (urinary incontinence) or an overactive bladder.  Unusual cells found in a urine sample.  A blockage in the urethra, such as a urinary stone.  Painful urination.  An abnormality in the bladder found during an intravenous pyelogram (IVP) or CT scan. Cystoscopy may also be done to remove a sample of tissue to be examined under a microscope (biopsy). What are the risks? Generally, this is a safe procedure. However, problems may occur, including:  Infection.  Bleeding.  What happens during the procedure?  1. You will be given one or more of the following: ? A medicine to numb the area (local anesthetic). 2. The area around the opening of your urethra will be cleaned. 3. The cystoscope will be passed through your urethra into your bladder. 4. Germ-free (sterile) fluid will flow through the cystoscope to fill your bladder. The fluid will stretch your bladder so that your health care provider can clearly examine your bladder walls. 5. Your doctor will look at the urethra and bladder. 6. The cystoscope will be removed The procedure may vary among health care providers  What can I expect after the procedure? After the procedure, it is common to have: 1. Some soreness or pain in your abdomen and urethra. 2. Urinary symptoms.  These include: ? Mild pain or burning when you urinate. Pain should stop within a few minutes after you urinate. This may last for up to 1 week. ? A small amount of blood in your urine for several days. ? Feeling like you need to urinate but producing only a small amount of urine. Follow these instructions at home: General instructions  Return to your normal activities as told by your health care provider.   Do not drive for 24 hours if you were given a sedative during your procedure.  Watch for any blood in your urine. If the amount of blood in your urine increases, call your health care provider.  If a tissue sample was removed for testing (biopsy) during your procedure, it is up to you to get your test results. Ask your health care provider, or the department that is doing the test, when your results will be ready.  Drink enough fluid to keep your urine pale yellow.  Keep all follow-up visits as told by your health care provider. This is important. Contact a health care provider if you:  Have pain that gets worse or does not get better with medicine, especially pain when you urinate.  Have trouble urinating.  Have more blood in your urine. Get help right away if you:  Have blood clots in your urine.  Have abdominal pain.  Have a fever or chills.  Are unable to urinate. Summary  Cystoscopy is a procedure that is used to help diagnose and sometimes treat conditions that affect the lower urinary tract.  Cystoscopy is done using   a thin, tube-shaped instrument with a light and camera at the end.  After the procedure, it is common to have some soreness or pain in your abdomen and urethra.  Watch for any blood in your urine. If the amount of blood in your urine increases, call your health care provider.  If you were prescribed an antibiotic medicine, take it as told by your health care provider. Do not stop taking the antibiotic even if you start to feel better. This  information is not intended to replace advice given to you by your health care provider. Make sure you discuss any questions you have with your health care provider. Document Revised: 08/11/2018 Document Reviewed: 08/11/2018 Elsevier Patient Education  2020 Elsevier Inc.   

## 2020-07-25 LAB — MICROSCOPIC EXAMINATION: Bacteria, UA: NONE SEEN

## 2020-07-25 LAB — URINALYSIS, COMPLETE
Bilirubin, UA: NEGATIVE
Glucose, UA: NEGATIVE
Ketones, UA: NEGATIVE
Leukocytes,UA: NEGATIVE
Nitrite, UA: NEGATIVE
RBC, UA: NEGATIVE
Specific Gravity, UA: 1.025 (ref 1.005–1.030)
Urobilinogen, Ur: 0.2 mg/dL (ref 0.2–1.0)
pH, UA: 5.5 (ref 5.0–7.5)

## 2020-07-29 LAB — CULTURE, URINE COMPREHENSIVE

## 2020-07-31 ENCOUNTER — Other Ambulatory Visit: Payer: Self-pay | Admitting: *Deleted

## 2020-07-31 MED ORDER — SULFAMETHOXAZOLE-TRIMETHOPRIM 800-160 MG PO TABS: 1.0000 | ORAL_TABLET | Freq: Two times a day (BID) | ORAL | 0 refills | Status: AC

## 2020-07-31 NOTE — Telephone Encounter (Signed)
Notified patient as instructed, patient pleased. Discussed follow-up appointments, patient agrees RX sent

## 2020-08-10 ENCOUNTER — Other Ambulatory Visit: Payer: Self-pay

## 2020-08-10 ENCOUNTER — Ambulatory Visit (INDEPENDENT_AMBULATORY_CARE_PROVIDER_SITE_OTHER): Payer: Medicare Other | Admitting: Podiatry

## 2020-08-10 ENCOUNTER — Encounter: Payer: Self-pay | Admitting: Podiatry

## 2020-08-10 DIAGNOSIS — D689 Coagulation defect, unspecified: Secondary | ICD-10-CM | POA: Diagnosis not present

## 2020-08-10 DIAGNOSIS — B351 Tinea unguium: Secondary | ICD-10-CM | POA: Diagnosis not present

## 2020-08-10 DIAGNOSIS — M79609 Pain in unspecified limb: Secondary | ICD-10-CM | POA: Diagnosis not present

## 2020-08-10 DIAGNOSIS — G629 Polyneuropathy, unspecified: Secondary | ICD-10-CM | POA: Diagnosis not present

## 2020-08-10 NOTE — Progress Notes (Signed)
This patient returns to my office for at risk foot care.  This patient requires this care by a professional since this patient will be at risk due to having stage 3a chronic kidney disease and coagulation defect.  Patient is taking coumadin.  This patient is unable to cut nails himself since the patient cannot reach his nails.These nails are painful walking and wearing shoes.  This patient presents for at risk foot care today.  General Appearance  Alert, conversant and in no acute stress.  Vascular  Dorsalis pedis and posterior tibial  pulses are palpable  bilaterally.  Capillary return is within normal limits  bilaterally. Temperature is within normal limits  bilaterally.  Neurologic  Senn-Weinstein monofilament wire test within normal limits  bilaterally. Muscle power within normal limits bilaterally.  Nails Thick disfigured discolored nails with subungual debris  from hallux to fifth toes bilaterally. No evidence of bacterial infection or drainage bilaterally.  Orthopedic  No limitations of motion  feet .  No crepitus or effusions noted.  No bony pathology or digital deformities noted.  Skin  normotropic skin with no porokeratosis noted bilaterally.  No signs of infections or ulcers noted.     Onychomycosis  Pain in right toes  Pain in left toes  Consent was obtained for treatment procedures.   Mechanical debridement of nails 1-5  bilaterally performed with a nail nipper.  Filed with dremel without incident.    Return office visit    10 weeks                  Told patient to return for periodic foot care and evaluation due to potential at risk complications.   Gardiner Barefoot DPM

## 2020-08-14 ENCOUNTER — Other Ambulatory Visit: Payer: Self-pay | Admitting: Urology

## 2020-08-21 ENCOUNTER — Other Ambulatory Visit: Payer: Self-pay | Admitting: Urology

## 2020-09-04 ENCOUNTER — Other Ambulatory Visit: Payer: Medicare Other | Admitting: Urology

## 2020-10-02 ENCOUNTER — Ambulatory Visit
Admission: RE | Admit: 2020-10-02 | Discharge: 2020-10-02 | Disposition: A | Discharge status: Home / Self Care | Payer: Medicare Other | Source: Ambulatory Visit | Attending: Urology | Admitting: Urology

## 2020-10-02 ENCOUNTER — Other Ambulatory Visit: Payer: Self-pay

## 2020-10-02 DIAGNOSIS — R310 Gross hematuria: Secondary | ICD-10-CM | POA: Diagnosis not present

## 2020-10-02 HISTORY — DX: Heart failure, unspecified: I50.9

## 2020-10-02 LAB — POCT I-STAT CREATININE: Creatinine, Ser: 1.3 mg/dL — ABNORMAL HIGH (ref 0.61–1.24)

## 2020-10-02 MED ORDER — IOHEXOL 300 MG/ML  SOLN
125.0000 mL | Freq: Once | INTRAMUSCULAR | Status: AC | PRN
  Administered 2020-10-02: 125 mL via INTRAVENOUS

## 2020-10-09 ENCOUNTER — Ambulatory Visit (INDEPENDENT_AMBULATORY_CARE_PROVIDER_SITE_OTHER): Payer: Medicare Other | Admitting: Urology

## 2020-10-09 ENCOUNTER — Other Ambulatory Visit: Payer: Self-pay

## 2020-10-09 ENCOUNTER — Encounter: Payer: Self-pay | Admitting: Urology

## 2020-10-09 VITALS — BP 157/77 | HR 102

## 2020-10-09 DIAGNOSIS — R310 Gross hematuria: Secondary | ICD-10-CM

## 2020-10-09 NOTE — Progress Notes (Signed)
10/09/2020 8:45 AM   Kyle Gutierrez [birth year] [id number]  Referring provider: Dion Body, MD [facility] [place],  [place] [address]  Chief Complaint  Patient presents with  . Hematuria    HPI: Patient last seen 5 years ago.  He has been dilated for stricture disease.  He has had a left partial nephrectomy in 2002 for renal cell carcinoma.  He has had a transurethral resection of the prostate and his residual 5 years ago was 37 mL.  Primary care referred the patient because he reported to them that once or twice a year for less than 24 hours she will have blood in the urine.  He does take Coumadin for last 25 years  At baseline he voids every 2 hours and gets up once or twice a night.  Flow is reasonable.  He generally feels empty.  Intermittently he may have mild strain.  He has a smoking history  Patient has a history of gross hematuria.  I will order a CT scan.  He has a distant history of renal cell carcinoma.  I will have him come back for cystoscopy.  If he has a stricture and he ever needed dilation I would asked my partner to do this just in case he had a bladder cancer that need to be treated simultaneously under anesthesia.  I think he went through a lot in 2016 when he had a stricture dilated under anesthesia and he said he does not want to go through that again  Today The patient had balloon dilation of urethral stricture looking for causes of hematuria in 2014 by Dr. Alinda Money in [place].  I was dilated again here in 2016. Frequency stable no more blood in urine.  The procedure in 2016 was done at the bedside.  He said he does not want cystoscopy and he said he would never have another anesthetic at his age just to clear his bladder.  We talked about differential diagnosis and I think he made a very good decision   PMH: Past Medical History:  Diagnosis Date  . Benign prostatic hypertrophy    s/p TURP  . Cardiomyopathy  ([facility])   . Carotid artery plaque    bilateral  . CHF (congestive heart failure) ([facility])   . Chronic atrial fibrillation ([facility])   . Complication of anesthesia    balance problem  . Coronary artery disease, non-occlusive   . Dyspnea   . Dysrhythmia    atrial fib  . Elevated lipids   . Gross hematuria   . Hypertension   . Neuropathy, alcoholic ([facility])    per Neurologic eval, remote  . Nocturia   . Poor balance   . renal cell carcinoma 2002   left kidney cancer with partial nephrectomy.  Marland Kitchen Retinopathy    followed by [facility]  . Sleep apnea   . Thrombocytopenia, unspecified ([facility])   . Urethral stricture   . Urinary frequency   . Valvular cardiomyopathy ([facility])    severe mitral and tricuspid insufficiency, ECHO July 2012    Surgical History: Past Surgical History:  Procedure Laterality Date  . CYSTOSCOPY W/ RETROGRADES N/A 05/06/2013   Procedure: CYSTOSCOPY WITH RETROGRADE PYELOGRAM,  BALLOON DILATION OF URETHRAL STRICTURE;  Surgeon: Dutch Gray, MD;  Location: [facility];  Service: Urology;  Laterality: N/A;  . EYE SURGERY     bilateral cataracts removed  . left rotator cuff repair    . PARTIAL NEPHRECTOMY Left    10'02  .  PROSTATE SURGERY    . ROTATOR CUFF REPAIR Right   . SHOULDER ARTHROSCOPY WITH OPEN ROTATOR CUFF REPAIR Left 07/02/2018   Procedure: SHOULDER ARTHROSCOPY WITH MINI OPEN ROTATOR CUFF REPAIR INCLUDING SUBSCAPULARIS ,SUBACROMINAL DECOMPRESSION.;  Surgeon: Thornton Park, MD;  Location: [facility];  Service: Orthopedics;  Laterality: Left;  . TOTAL HIP ARTHROPLASTY Right 06/19/2017   Procedure: TOTAL HIP ARTHROPLASTY ANTERIOR APPROACH;  Surgeon: Hessie Knows, MD;  Location: [facility];  Service: Orthopedics;  Laterality: Right;  . TRANSURETHRAL RESECTION OF PROSTATE  2003    Home Medications:  Allergies as of 10/09/2020      Reactions   Latex Rash   Tape Rash, Other (See Comments)   OK to use paper tape OK to use paper tape   Tapentadol Rash      Medication List        Accurate as of October 09, 2020  8:45 AM. If you have any questions, ask your nurse or doctor.        acetaminophen 500 MG tablet Commonly known as: TYLENOL Take 1,000 mg by mouth 2 (two) times daily.   losartan-hydrochlorothiazide 100-12.5 MG tablet Commonly known as: HYZAAR Take 1 tablet by mouth daily.   metoprolol succinate 25 MG 24 hr tablet Commonly known as: TOPROL-XL Take 12.5 mg by mouth daily.   PRESERVISION AREDS 2 PO Take 1 capsule by mouth 2 (two) times daily.   simvastatin 40 MG tablet Commonly known as: ZOCOR Take 1 tablet (40 mg total) by mouth every morning.   spironolactone 25 MG tablet Commonly known as: ALDACTONE Take 1 tablet by mouth daily.   warfarin 2 MG tablet Commonly known as: COUMADIN Take by mouth.   warfarin 3 MG tablet Commonly known as: COUMADIN Take by mouth.       Allergies:  Allergies  Allergen Reactions  . Latex Rash  . Tape Rash and Other (See Comments)    OK to use paper tape OK to use paper tape  . Tapentadol Rash    Family History: Family History  Problem Relation Age of Onset  . Heart disease Mother 60       heart failure  . Early death Father 60  . Heart disease Father        CAD  . Cancer Sister        Breast    Social History:  reports that he quit smoking about 43 years ago. He has never used smokeless tobacco. He reports current alcohol use. He reports that he does not use drugs.  ROS:                                        Physical Exam: There were no vitals taken for this visit.  Constitutional:  Alert and oriented, No acute distress.   Laboratory Data: Lab Results  Component Value Date   WBC 11.9 (H) 07/03/2018   HGB 12.7 (L) 07/03/2018   HCT 40.4 07/03/2018   MCV 97.8 07/03/2018   PLT 133 (L) 07/03/2018    Lab Results  Component Value Date   CREATININE 1.30 (H) 10/02/2020    Lab Results  Component Value Date   PSA 1.0 02/13/2012   PSA 0.82 02/28/2009     No results found for: TESTOSTERONE  Lab Results  Component Value Date   HGBA1C 5.4 01/05/2009    Urinalysis    Component Value Date/Time  COLORURINE YELLOW (A) 06/04/2017 1516   APPEARANCEUR Hazy (A) 07/24/2020 1031   LABSPEC 1.015 06/04/2017 1516   LABSPEC 1.009 03/19/2014 1032   PHURINE 6.0 06/04/2017 1516   GLUCOSEU Negative 07/24/2020 1031   GLUCOSEU Negative 03/19/2014 1032   GLUCOSEU NEGATIVE 08/11/2012 1000   HGBUR NEGATIVE 06/04/2017 1516   BILIRUBINUR Negative 07/24/2020 1031   BILIRUBINUR Negative 03/19/2014 1032   KETONESUR NEGATIVE 06/04/2017 1516   PROTEINUR Trace (A) 07/24/2020 1031   PROTEINUR NEGATIVE 06/04/2017 1516   UROBILINOGEN 0.2 08/11/2012 1000   NITRITE Negative 07/24/2020 1031   NITRITE NEGATIVE 06/04/2017 1516   LEUKOCYTESUR Negative 07/24/2020 1031   LEUKOCYTESUR Negative 03/19/2014 1032    Pertinent Imaging:   Assessment & Plan: The patient as needed.  At age 12 he does not want further evaluation  1. Hematuria, gross  - Urinalysis, Complete   No follow-ups on file.  Reece Packer, MD  [facility] [address] [place], [place] [address] [phone]

## 2020-10-10 LAB — MICROSCOPIC EXAMINATION: Bacteria, UA: NONE SEEN

## 2020-10-10 LAB — URINALYSIS, COMPLETE
Bilirubin, UA: NEGATIVE
Glucose, UA: NEGATIVE
Ketones, UA: NEGATIVE
Leukocytes,UA: NEGATIVE
Nitrite, UA: NEGATIVE
Protein,UA: NEGATIVE
RBC, UA: NEGATIVE
Specific Gravity, UA: 1.02 (ref 1.005–1.030)
Urobilinogen, Ur: 0.2 mg/dL (ref 0.2–1.0)
pH, UA: 5 (ref 5.0–7.5)

## 2020-10-19 ENCOUNTER — Ambulatory Visit: Payer: Medicare Other | Admitting: Podiatry

## 2020-10-23 ENCOUNTER — Encounter: Payer: Self-pay | Admitting: Podiatry

## 2020-10-23 ENCOUNTER — Other Ambulatory Visit: Payer: Self-pay

## 2020-10-23 ENCOUNTER — Ambulatory Visit (INDEPENDENT_AMBULATORY_CARE_PROVIDER_SITE_OTHER): Payer: Medicare Other | Admitting: Podiatry

## 2020-10-23 DIAGNOSIS — M79609 Pain in unspecified limb: Secondary | ICD-10-CM | POA: Diagnosis not present

## 2020-10-23 DIAGNOSIS — G629 Polyneuropathy, unspecified: Secondary | ICD-10-CM

## 2020-10-23 DIAGNOSIS — B351 Tinea unguium: Secondary | ICD-10-CM

## 2020-10-23 DIAGNOSIS — D689 Coagulation defect, unspecified: Secondary | ICD-10-CM

## 2020-10-23 NOTE — Progress Notes (Signed)
This patient returns to my office for at risk foot care.  This patient requires this care by a professional since this patient will be at risk due to having stage 3a chronic kidney disease and coagulation defect.  Patient is taking coumadin.  This patient is unable to cut nails himself since the patient cannot reach his nails.These nails are painful walking and wearing shoes.  This patient presents for at risk foot care today.  General Appearance  Alert, conversant and in no acute stress.  Vascular  Dorsalis pedis and posterior tibial  pulses are palpable  bilaterally.  Capillary return is within normal limits  bilaterally. Temperature is within normal limits  bilaterally.  Neurologic  Senn-Weinstein monofilament wire test within normal limits  bilaterally. Muscle power within normal limits bilaterally.  Nails Thick disfigured discolored nails with subungual debris  from hallux to fifth toes bilaterally. No evidence of bacterial infection or drainage bilaterally.  Orthopedic  No limitations of motion  feet .  No crepitus or effusions noted.  No bony pathology or digital deformities noted.  Skin  normotropic skin with no porokeratosis noted bilaterally.  No signs of infections or ulcers noted.     Onychomycosis  Pain in right toes  Pain in left toes  Consent was obtained for treatment procedures.   Mechanical debridement of nails 1-5  bilaterally performed with a nail nipper.  Filed with dremel without incident.    Return office visit    10 weeks                  Told patient to return for periodic foot care and evaluation due to potential at risk complications.   Gardiner Barefoot DPM

## 2021-01-08 ENCOUNTER — Ambulatory Visit: Payer: Medicare Other

## 2021-04-11 ENCOUNTER — Encounter: Payer: Self-pay | Admitting: Podiatry

## 2021-04-11 ENCOUNTER — Ambulatory Visit (INDEPENDENT_AMBULATORY_CARE_PROVIDER_SITE_OTHER): Payer: Medicare Other | Admitting: Podiatry

## 2021-04-11 ENCOUNTER — Other Ambulatory Visit: Payer: Self-pay

## 2021-04-11 DIAGNOSIS — M76822 Posterior tibial tendinitis, left leg: Secondary | ICD-10-CM

## 2021-04-11 DIAGNOSIS — Q666 Other congenital valgus deformities of feet: Secondary | ICD-10-CM | POA: Diagnosis not present

## 2021-04-11 NOTE — Progress Notes (Signed)
He presents today chief complaint of pain to the medial lateral aspect of his left foot.  He states been aching for about 5 months which [facility] and they did x-rays and gave him prednisone he states he cannot walk for long periods of time and he has neuropathy so he is lost his balance and having weakness issues.  States that he is just finished up physical therapy for strengthening and balance.  Objective: Vital signs are stable he is alert oriented x3 pulses remain palpable left.  He has some severe pes planovalgus of his left foot which is rigid in nature he is unable to get his foot to a neutral position his posterior tibial tendon dysfunction which is resulted in his severe flatfoot deformity.  He has normal Achilles strength as well as dorsiflexor strength of the extensor tendons.  Assessment: Severe pes planovalgus and posterior tibial tendon dysfunction left.  Plan: At this point we are requesting that EJ evaluate him and consider possibly an [place] for [employer].  I think [place] brace is going to be his best bet but I would like for him to talk it through with [place] to make sure that this is a good brace for him.

## 2021-05-04 ENCOUNTER — Ambulatory Visit (INDEPENDENT_AMBULATORY_CARE_PROVIDER_SITE_OTHER): Payer: Medicare Other | Admitting: Physician Assistant

## 2021-05-04 ENCOUNTER — Other Ambulatory Visit: Payer: Self-pay

## 2021-05-04 VITALS — BP 180/76 | HR 76 | Ht 72.0 in | Wt 220.0 lb

## 2021-05-04 DIAGNOSIS — R310 Gross hematuria: Secondary | ICD-10-CM

## 2021-05-04 LAB — BLADDER SCAN AMB NON-IMAGING

## 2021-05-04 MED ORDER — TAMSULOSIN HCL 0.4 MG PO CAPS: 0.4000 mg | ORAL_CAPSULE | Freq: Every day | ORAL | 0 refills | Status: DC

## 2021-05-04 NOTE — Patient Instructions (Addendum)
You may stop ciprofloxacin. Please contact Dr. Nehemiah Massed to let him know you are discontinuing ciprofloxacin, as he may need to adjust your Coumadin dose accordingly. I'm rechecking your blood counts today and will contact you early next week to share your results. Stay well hydrated to dilute your urine. We are restarting Flomax daily to increase your chances of being able to pass any blood clots. If you become unable to urinate again and develop lower abdominal pain, please go to the Emergency Department immediately or contact our office for assistance if during office hours (8a-5p Monday-Friday, though please note we are closed this coming Monday for Labor Day).

## 2021-05-04 NOTE — Progress Notes (Signed)
05/04/2021 1:00 PM   Kyle Gutierrez 1931/09/09 [id number]  CC: Chief Complaint  Patient presents with   Hematuria   HPI: Kyle Gutierrez is a 87 y.o. male with PMH renal cell carcinoma s/p left partial nephrectomy in 2002, BPH s/p TURP around 2003, urethral stricture disease s/p multiple dilations, A. fib on Coumadin, and gross hematuria with benign CT urogram in January 2022 who declined cystoscopy at the time who presents today for evaluation of gross hematuria.  He is accompanied today by his wife, who contributes to HPI.  Today he reports a 9-day history of continuous gross hematuria.  He states his prior episodes of gross hematuria only lasted 1 to 2 days.  Yesterday, he developed some difficulty voiding but was ultimately able to pass a clot and has not had difficulty voiding since.  He notes some blood leaking from his penis.  He denies flank or lower abdominal pain associated with this.Marland Kitchen  He was seen at [facility] 3 days ago for the same.  UA was notable for 10-50 WBCs/hpf, >50 RBCs/hpf, and moderate bacteria; urine culture finalized with insignificant growth. He was started on empiric Cipro at the time and he remains on this today.  Notably, his hemoglobin was stable that day at 14.1.  Notably, patient reports he poorly tolerates anesthesia.  He would like to avoid anesthesia if at all possible.  Patient declines repeat UA today. PVR 74mL.  PMH: Past Medical History:  Diagnosis Date   Benign prostatic hypertrophy    s/p TURP   Cardiomyopathy ([place])    Carotid artery plaque    bilateral   CHF (congestive heart failure) (HCC)    Chronic atrial fibrillation (HCC)    Complication of anesthesia    balance problem   Coronary artery disease, non-occlusive    Dyspnea    Dysrhythmia    atrial fib   Elevated lipids    Gross hematuria    Hypertension    Neuropathy, alcoholic ([place])    per Neurologic eval, remote   Nocturia    Poor balance    renal cell carcinoma 2002    left kidney cancer with partial nephrectomy.   Retinopathy    followed by [facility]   Sleep apnea    Thrombocytopenia, unspecified (HCC)    Urethral stricture    Urinary frequency    Valvular cardiomyopathy (HCC)    severe mitral and tricuspid insufficiency, ECHO July 2012    Surgical History: Past Surgical History:  Procedure Laterality Date   CYSTOSCOPY W/ RETROGRADES N/A 05/06/2013   Procedure: CYSTOSCOPY WITH RETROGRADE PYELOGRAM,  BALLOON DILATION OF URETHRAL STRICTURE;  Surgeon: Dutch Gray, MD;  Location: [facility];  Service: Urology;  Laterality: N/A;   EYE SURGERY     bilateral cataracts removed   left rotator cuff repair     PARTIAL NEPHRECTOMY Left    10'02   PROSTATE SURGERY     ROTATOR CUFF REPAIR Right    SHOULDER ARTHROSCOPY WITH OPEN ROTATOR CUFF REPAIR Left 07/02/2018   Procedure: SHOULDER ARTHROSCOPY WITH MINI OPEN ROTATOR CUFF REPAIR INCLUDING SUBSCAPULARIS ,SUBACROMINAL DECOMPRESSION.;  Surgeon: Thornton Park, MD;  Location: [facility] ORS;  Service: Orthopedics;  Laterality: Left;   TOTAL HIP ARTHROPLASTY Right 06/19/2017   Procedure: TOTAL HIP ARTHROPLASTY ANTERIOR APPROACH;  Surgeon: Hessie Knows, MD;  Location: [facility] ORS;  Service: Orthopedics;  Laterality: Right;   TRANSURETHRAL RESECTION OF PROSTATE  2003    Home Medications:  Allergies as of 05/04/2021       Reactions  Ace Inhibitors Cough   REACTION: cough   Latex Rash   Tape Rash, Other (See Comments)   OK to use paper tape OK to use paper tape   Tapentadol Rash        Medication List        Accurate as of May 04, 2021  1:00 PM. If you have any questions, ask your nurse or doctor.          acetaminophen 500 MG tablet Commonly known as: TYLENOL Take 1,000 mg by mouth 2 (two) times daily.   Azelastine HCl 137 MCG/SPRAY Soln   losartan-hydrochlorothiazide 100-12.5 MG tablet Commonly known as: HYZAAR Take 1 tablet by mouth daily.   metoprolol succinate 25 MG 24 hr tablet Commonly  known as: TOPROL-XL Take 12.5 mg by mouth daily.   PRESERVISION AREDS 2 PO Take 1 capsule by mouth 2 (two) times daily.   simvastatin 40 MG tablet Commonly known as: ZOCOR Take 1 tablet (40 mg total) by mouth every morning.   spironolactone 25 MG tablet Commonly known as: ALDACTONE Take 1 tablet by mouth daily.   warfarin 2 MG tablet Commonly known as: COUMADIN Take by mouth.   warfarin 3 MG tablet Commonly known as: COUMADIN Take by mouth.        Allergies:  Allergies  Allergen Reactions   Ace Inhibitors Cough    REACTION: cough   Latex Rash   Tape Rash and Other (See Comments)    OK to use paper tape OK to use paper tape   Tapentadol Rash    Family History: Family History  Problem Relation Age of Onset   Heart disease Mother 88       heart failure   Early death Father 83   Heart disease Father        CAD   Cancer Sister        Breast    Social History:   reports that he quit smoking about 44 years ago. His smoking use included cigarettes. He has never used smokeless tobacco. He reports current alcohol use. He reports that he does not use drugs.  Physical Exam: BP (!) 180/76   Pulse 76   Ht 6' (1.829 m)   Wt 220 lb (99.8 kg)   BMI 29.84 kg/m   Constitutional:  Alert and oriented, no acute distress, nontoxic appearing HEENT: [place], AT Cardiovascular: No clubbing, cyanosis, or edema Respiratory: Normal respiratory effort, no increased work of breathing Skin: No rashes, bruises or suspicious lesions Neurologic: Grossly intact, no focal deficits, moving all 4 extremities Psychiatric: Normal mood and affect  Laboratory Data: Results for orders placed or performed in visit on 05/04/21  Bladder Scan (Post Void Residual) in office  Result Value Ref Range   Scan Result 59mL    Assessment & Plan:   1. Hematuria, gross Longer duration of gross hematuria than prior episodes.  CT urogram earlier this year with no significant findings.  Hemoglobin stable,  but will repeat H&H today for thoroughness.  I counseled him to stop Cipro given negative urine culture and to contact Dr. Nehemiah Massed so they may make any necessary adjustments to his Coumadin dosing.  I recommended that we proceed with cystoscopy at this time given that his gross hematuria is worsening and he appears to have had a mild episode of clot retention yesterday that spontaneously resolved.  Patient and wife are in agreement with this plan.  We will schedule him for cystoscopy with Dr. Matilde Sprang.  I offered  him Flomax to increase his ability to pass clots and he accepted, though I explained that if his symptoms represent recurrence of his urethral stricture, I do not expect this medication will help with that.  We discussed return precautions today including difficulty urinating and the inability to void.  I counseled her to proceed to the emergency department if outside office hours for further assistance if this occurs and he expressed understanding. - Bladder Scan (Post Void Residual) in office - tamsulosin (FLOMAX) 0.4 MG CAPS capsule; Take 1 capsule (0.4 mg total) by mouth daily.  Dispense: 30 capsule; Refill: 0 - Hemoglobin and Hematocrit, Blood   Return in about 2 weeks (around 05/18/2021) for Cysto with Dr. Matilde Sprang.  Debroah Loop, PA-C  [facility] [address] [place], [place] [address] [phone]

## 2021-05-05 LAB — HEMOGLOBIN AND HEMATOCRIT, BLOOD
Hematocrit: 44.2 % (ref 37.5–51.0)
Hemoglobin: 14.5 g/dL (ref 13.0–17.7)

## 2021-05-08 ENCOUNTER — Telehealth: Payer: Self-pay

## 2021-05-08 NOTE — Telephone Encounter (Signed)
Notified patient as advised, patient expressed understanding.

## 2021-05-08 NOTE — Telephone Encounter (Signed)
-----   Message from Debroah Loop, [place] sent at 05/08/2021  8:39 AM EDT ----- Blood counts are stable, good news. Please follow up with cystoscopy as scheduled. ----- Message ----- From: Lavone Neri Lab Results In Sent: 05/05/2021   5:37 AM EDT To: Debroah Loop, PA-C

## 2021-05-09 ENCOUNTER — Ambulatory Visit: Payer: Medicare Other | Admitting: Physician Assistant

## 2021-05-16 ENCOUNTER — Ambulatory Visit (INDEPENDENT_AMBULATORY_CARE_PROVIDER_SITE_OTHER): Payer: Medicare Other

## 2021-05-16 ENCOUNTER — Other Ambulatory Visit: Payer: Self-pay

## 2021-05-16 DIAGNOSIS — Q666 Other congenital valgus deformities of feet: Secondary | ICD-10-CM

## 2021-05-16 DIAGNOSIS — M76822 Posterior tibial tendinitis, left leg: Secondary | ICD-10-CM

## 2021-05-16 NOTE — Progress Notes (Signed)
CASTING: Custom Molded Ankle Foot Orthosis                                          Style: AFO Breeze [id number], [id number], [id number]  Patient was casted/scanned for a medically necessary Custom Molded Ankle Foot Orthosis (AFO) for the left ankle/foot. The Custom Molded Ankle Foot Orthosis (AFO) was prescribed by Dr. Milinda Pointer. The cast/scan was taken in semi weight bearing position. The cast/scan was evaluated afterwards and deemed appropriate for fabrication.    The patient is ambulatory and has weakness or deformity of the foot and ankle and requires stabilization for medical reasons with the potential to benefit functionally.    The patient could not be fit with a prefabricated AFO as their condition necessitates wearing the orthosis for a duration of more than six months with the need to control the foot and ankle in more than one plane. The patient has a documented neurological, circulatory, or orthopedic condition that requires custom fabrication over a model to prevent tissue injury.   Please see prescribing physician chart notes for further documentation concerning the specific foot and ankle problems being treated by the prescribing physician. Patient will return in ___ weeks for dispensing of Custom Molded Ankle Foot Orthosis.

## 2021-05-17 ENCOUNTER — Other Ambulatory Visit: Payer: Medicare Other | Admitting: Urology

## 2021-05-21 ENCOUNTER — Ambulatory Visit: Payer: Medicare Other | Admitting: Urology

## 2021-05-21 ENCOUNTER — Ambulatory Visit (INDEPENDENT_AMBULATORY_CARE_PROVIDER_SITE_OTHER): Payer: Medicare Other | Admitting: Urology

## 2021-05-21 ENCOUNTER — Other Ambulatory Visit: Payer: Self-pay

## 2021-05-21 VITALS — BP 155/77 | HR 98

## 2021-05-21 DIAGNOSIS — R310 Gross hematuria: Secondary | ICD-10-CM | POA: Diagnosis not present

## 2021-05-21 NOTE — Progress Notes (Signed)
05/21/2021 3:41 PM   Reather Laurence 17-May-1932 [id number]  Referring provider: Dion Body, MD [facility] [place],  [place] [address]  Chief Complaint  Patient presents with   Cysto    HPI: Patient last seen 5 years ago.  He has been dilated for stricture disease.  He has had a left partial nephrectomy in 2002 for renal cell carcinoma.  He has had a transurethral resection of the prostate and his residual 5 years ago was 37 mL.   Primary care referred the patient because he reported to them that once or twice a year for less than 24 hours she will have blood in the urine.  He does take Coumadin for last 25 years   At baseline he voids every 2 hours and gets up once or twice a night.  Flow is reasonable.  He generally feels empty.  Intermittently he may have mild strain.  He has a smoking history   Patient has a history of gross hematuria.  I will order a CT scan.  He has a distant history of renal cell carcinoma.  I will have him come back for cystoscopy.  If he has a stricture and he ever needed dilation I would asked my partner to do this just in case he had a bladder cancer that need to be treated simultaneously under anesthesia.   I think he went through a lot in 2016 when he had a stricture dilated under anesthesia and he said he does not want to go through that again   Today The patient had balloon dilation of urethral stricture looking for causes of hematuria in 2014 by Dr. Alinda Money in [place].  I was dilated again here in 2016. Frequency stable no more blood in urine.  The procedure in 2016 was done at the bedside.  He said he does not want cystoscopy and he said he would never have another anesthetic at his age just to clear his bladder.  We talked about differential diagnosis and I think he made a very good decision    The patient as needed.  At age 87 he does not want further evaluation  Today In September 2022 patient saw a  Designer, jewellery.  He is on Coumadin.  He had a 9-day history of blood in the urine.  He passed a clot the day prior.  He had a negative culture 3 days prior at [facility] and was given ciprofloxacin.  He declined a repeat urinalysis on that day and his residual was 27 mL.  He was given Flomax.  He had a positive culture July 22, 2020.  A hematuria CT scan demonstrated normal kidneys and a large median lobe of the prostate  Cystoscopy: Patient underwent flexible cystoscopy.  The penile and majority of the bulbar urethra normal.  He had a mild stricture in the proximal bulbar urethra approximately 2 cm distal to the sphincter.  Is approximately 78 [place] was only a web but I could not negotiate through it.  Patient has not had any more bleeding for 2 weeks.  The patient has a choice of going to sleep for a dilation and bladder examination.  If he had cancer he would be resected or biopsied.  We talked about the differential diagnosis.  If the scope was firmer today I would have been able to negotiate through it likely.  Based upon his age and comorbidities and general health certainly watchful waiting is a reasonable choice.  PMH: Past Medical History:  Diagnosis Date   Benign prostatic hypertrophy    s/p TURP   Cardiomyopathy ([place])    Carotid artery plaque    bilateral   CHF (congestive heart failure) (HCC)    Chronic atrial fibrillation (HCC)    Complication of anesthesia    balance problem   Coronary artery disease, non-occlusive    Dyspnea    Dysrhythmia    atrial fib   Elevated lipids    Gross hematuria    Hypertension    Neuropathy, alcoholic ([place])    per Neurologic eval, remote   Nocturia    Poor balance    renal cell carcinoma 2002   left kidney cancer with partial nephrectomy.   Retinopathy    followed by [facility]   Sleep apnea    Thrombocytopenia, unspecified (HCC)    Urethral stricture    Urinary frequency    Valvular cardiomyopathy (HCC)    severe mitral and  tricuspid insufficiency, ECHO July 2012    Surgical History: Past Surgical History:  Procedure Laterality Date   CYSTOSCOPY W/ RETROGRADES N/A 05/06/2013   Procedure: CYSTOSCOPY WITH RETROGRADE PYELOGRAM,  BALLOON DILATION OF URETHRAL STRICTURE;  Surgeon: Dutch Gray, MD;  Location: [facility];  Service: Urology;  Laterality: N/A;   EYE SURGERY     bilateral cataracts removed   left rotator cuff repair     PARTIAL NEPHRECTOMY Left    10'02   PROSTATE SURGERY     ROTATOR CUFF REPAIR Right    SHOULDER ARTHROSCOPY WITH OPEN ROTATOR CUFF REPAIR Left 07/02/2018   Procedure: SHOULDER ARTHROSCOPY WITH MINI OPEN ROTATOR CUFF REPAIR INCLUDING SUBSCAPULARIS ,SUBACROMINAL DECOMPRESSION.;  Surgeon: Thornton Park, MD;  Location: [facility];  Service: Orthopedics;  Laterality: Left;   TOTAL HIP ARTHROPLASTY Right 06/19/2017   Procedure: TOTAL HIP ARTHROPLASTY ANTERIOR APPROACH;  Surgeon: Hessie Knows, MD;  Location: [facility] ORS;  Service: Orthopedics;  Laterality: Right;   TRANSURETHRAL RESECTION OF PROSTATE  2003    Home Medications:  Allergies as of 05/21/2021       Reactions   Ace Inhibitors Cough   REACTION: cough   Latex Rash   Tape Rash, Other (See Comments)   OK to use paper tape OK to use paper tape   Tapentadol Rash        Medication List        Accurate as of May 21, 2021  3:41 PM. If you have any questions, ask your nurse or doctor.          STOP taking these medications    Azelastine HCl 137 MCG/SPRAY Soln Stopped by: Reece Packer, MD   ciprofloxacin 250 MG tablet Commonly known as: CIPRO Stopped by: Reece Packer, MD       TAKE these medications    acetaminophen 500 MG tablet Commonly known as: TYLENOL Take 1,000 mg by mouth 2 (two) times daily.   losartan-hydrochlorothiazide 100-12.5 MG tablet Commonly known as: HYZAAR Take 1 tablet by mouth daily.   metoprolol succinate 25 MG 24 hr tablet Commonly known as: TOPROL-XL Take 12.5 mg by  mouth daily.   PRESERVISION AREDS 2 PO Take 1 capsule by mouth 2 (two) times daily.   simvastatin 40 MG tablet Commonly known as: ZOCOR Take 1 tablet (40 mg total) by mouth every morning.   spironolactone 25 MG tablet Commonly known as: ALDACTONE Take 1 tablet by mouth daily.   tamsulosin 0.4 MG Caps capsule Commonly known as: FLOMAX Take 1 capsule (0.4  mg total) by mouth daily.   warfarin 2 MG tablet Commonly known as: COUMADIN Take by mouth.   warfarin 3 MG tablet Commonly known as: COUMADIN Take by mouth.        Allergies:  Allergies  Allergen Reactions   Ace Inhibitors Cough    REACTION: cough   Latex Rash   Tape Rash and Other (See Comments)    OK to use paper tape OK to use paper tape   Tapentadol Rash    Family History: Family History  Problem Relation Age of Onset   Heart disease Mother 74       heart failure   Early death Father 36   Heart disease Father        CAD   Cancer Sister        Breast    Social History:  reports that he quit smoking about 44 years ago. His smoking use included cigarettes. He has never used smokeless tobacco. He reports current alcohol use. He reports that he does not use drugs.  ROS:                                        Physical Exam: BP (!) 155/77   Pulse 98   Constitutional:  Alert and oriented, No acute distress. HEENT: [place] AT, moist mucus membranes.  Trachea midline, no masses.   Laboratory Data: Lab Results  Component Value Date   WBC 11.9 (H) 07/03/2018   HGB 14.5 05/04/2021   HCT 44.2 05/04/2021   MCV 97.8 07/03/2018   PLT 133 (L) 07/03/2018    Lab Results  Component Value Date   CREATININE 1.30 (H) 10/02/2020    Lab Results  Component Value Date   PSA 1.0 02/13/2012   PSA 0.82 02/28/2009    No results found for: TESTOSTERONE  Lab Results  Component Value Date   HGBA1C 5.4 01/05/2009    Urinalysis    Component Value Date/Time   COLORURINE YELLOW (A)  06/04/2017 1516   APPEARANCEUR Clear 10/09/2020 0850   LABSPEC 1.015 06/04/2017 1516   LABSPEC 1.009 03/19/2014 1032   PHURINE 6.0 06/04/2017 1516   GLUCOSEU Negative 10/09/2020 0850   GLUCOSEU Negative 03/19/2014 1032   GLUCOSEU NEGATIVE 08/11/2012 1000   HGBUR NEGATIVE 06/04/2017 1516   BILIRUBINUR Negative 10/09/2020 0850   BILIRUBINUR Negative 03/19/2014 1032   KETONESUR NEGATIVE 06/04/2017 1516   PROTEINUR Negative 10/09/2020 0850   PROTEINUR NEGATIVE 06/04/2017 1516   UROBILINOGEN 0.2 08/11/2012 1000   NITRITE Negative 10/09/2020 0850   NITRITE NEGATIVE 06/04/2017 1516   LEUKOCYTESUR Negative 10/09/2020 0850   LEUKOCYTESUR Negative 03/19/2014 1032    Pertinent Imaging:   Assessment & Plan: We spoke about the patient's options.  He would need medical clearance.  This would be done by one of my partners in case he needs resection of a bladder tumor.  Which are was drawn.  Patient shows watchful waiting.  He has bled off and on for 15 years with the similar situation.  The only difference is that it lasted longer at this time were going to avoid the risk of an anesthetic.  See him as needed and see him in 1 year  1. Hematuria, gross  - Urinalysis, Complete   No follow-ups on file.  Reece Packer, MD  [facility] [address] [place], [place] [address] [phone]

## 2021-05-21 NOTE — Addendum Note (Signed)
Addended by: Alvera Novel on: 05/21/2021 04:17 PM   Modules accepted: Orders

## 2021-05-22 LAB — MICROSCOPIC EXAMINATION: Bacteria, UA: NONE SEEN

## 2021-05-22 LAB — URINALYSIS, COMPLETE
Bilirubin, UA: NEGATIVE
Glucose, UA: NEGATIVE
Ketones, UA: NEGATIVE
Nitrite, UA: NEGATIVE
Protein,UA: NEGATIVE
RBC, UA: NEGATIVE
Specific Gravity, UA: 1.02 (ref 1.005–1.030)
Urobilinogen, Ur: 0.2 mg/dL (ref 0.2–1.0)
pH, UA: 5.5 (ref 5.0–7.5)

## 2021-05-26 ENCOUNTER — Other Ambulatory Visit: Payer: Self-pay | Admitting: Physician Assistant

## 2021-05-26 DIAGNOSIS — R310 Gross hematuria: Secondary | ICD-10-CM

## 2021-06-15 ENCOUNTER — Other Ambulatory Visit: Payer: Self-pay | Admitting: Neurosurgery

## 2021-06-15 ENCOUNTER — Other Ambulatory Visit: Payer: Medicare Other

## 2021-06-15 ENCOUNTER — Other Ambulatory Visit: Payer: Self-pay

## 2021-06-15 DIAGNOSIS — M5416 Radiculopathy, lumbar region: Secondary | ICD-10-CM

## 2021-06-15 DIAGNOSIS — R1031 Right lower quadrant pain: Secondary | ICD-10-CM

## 2021-06-15 DIAGNOSIS — M51369 Other intervertebral disc degeneration, lumbar region without mention of lumbar back pain or lower extremity pain: Secondary | ICD-10-CM

## 2021-06-15 DIAGNOSIS — M5136 Other intervertebral disc degeneration, lumbar region: Secondary | ICD-10-CM

## 2021-06-18 ENCOUNTER — Encounter: Payer: Self-pay | Admitting: Podiatry

## 2021-06-21 ENCOUNTER — Other Ambulatory Visit: Payer: Self-pay

## 2021-06-21 ENCOUNTER — Ambulatory Visit
Admission: RE | Admit: 2021-06-21 | Discharge: 2021-06-21 | Disposition: A | Discharge status: Home / Self Care | Payer: Medicare Other | Source: Ambulatory Visit | Attending: Neurosurgery | Admitting: Neurosurgery

## 2021-06-21 DIAGNOSIS — M5136 Other intervertebral disc degeneration, lumbar region: Secondary | ICD-10-CM | POA: Insufficient documentation

## 2021-06-21 DIAGNOSIS — M5416 Radiculopathy, lumbar region: Secondary | ICD-10-CM | POA: Diagnosis present

## 2021-06-21 DIAGNOSIS — R1031 Right lower quadrant pain: Secondary | ICD-10-CM | POA: Insufficient documentation

## 2021-06-23 ENCOUNTER — Other Ambulatory Visit: Payer: Self-pay | Admitting: Physician Assistant

## 2021-06-23 DIAGNOSIS — R310 Gross hematuria: Secondary | ICD-10-CM

## 2021-06-25 ENCOUNTER — Ambulatory Visit: Payer: Medicare Other | Admitting: Student in an Organized Health Care Education/Training Program

## 2021-07-11 ENCOUNTER — Other Ambulatory Visit: Payer: Self-pay

## 2021-07-11 ENCOUNTER — Other Ambulatory Visit: Payer: Medicare Other

## 2021-07-11 DIAGNOSIS — Q666 Other congenital valgus deformities of feet: Secondary | ICD-10-CM | POA: Diagnosis not present

## 2021-07-11 DIAGNOSIS — M76822 Posterior tibial tendinitis, left leg: Secondary | ICD-10-CM | POA: Diagnosis not present

## 2021-09-13 ENCOUNTER — Ambulatory Visit: Payer: Medicare Other | Admitting: Student in an Organized Health Care Education/Training Program

## 2021-10-01 ENCOUNTER — Encounter: Payer: Self-pay | Admitting: Podiatry

## 2021-10-17 ENCOUNTER — Other Ambulatory Visit (HOSPITAL_COMMUNITY): Payer: Self-pay | Admitting: Neurology

## 2021-10-17 ENCOUNTER — Other Ambulatory Visit: Payer: Self-pay | Admitting: Neurology

## 2021-10-17 DIAGNOSIS — R202 Paresthesia of skin: Secondary | ICD-10-CM

## 2021-10-24 ENCOUNTER — Ambulatory Visit
Admission: RE | Admit: 2021-10-24 | Discharge: 2021-10-24 | Disposition: A | Discharge status: Home / Self Care | Payer: Medicare Other | Source: Ambulatory Visit | Attending: Neurology | Admitting: Neurology

## 2021-10-24 DIAGNOSIS — R202 Paresthesia of skin: Secondary | ICD-10-CM | POA: Insufficient documentation

## 2021-10-24 MED ORDER — GADOBUTROL 1 MMOL/ML IV SOLN
10.0000 mL | Freq: Once | INTRAVENOUS | Status: AC | PRN
  Administered 2021-10-24: 10 mL via INTRAVENOUS

## 2021-11-08 DIAGNOSIS — Z862 Personal history of diseases of the blood and blood-forming organs and certain disorders involving the immune mechanism: Secondary | ICD-10-CM | POA: Insufficient documentation

## 2021-12-10 ENCOUNTER — Ambulatory Visit: Payer: Medicare Other | Admitting: Urology

## 2021-12-22 ENCOUNTER — Inpatient Hospital Stay
Admission: EM | Admit: 2021-12-22 | Discharge: 2021-12-24 | DRG: 871 | Disposition: A | Discharge status: Home / Self Care | Payer: Medicare Other | Source: Skilled Nursing Facility | Attending: Internal Medicine | Admitting: Internal Medicine

## 2021-12-22 ENCOUNTER — Emergency Department: Payer: Medicare Other

## 2021-12-22 ENCOUNTER — Inpatient Hospital Stay: Payer: Medicare Other

## 2021-12-22 ENCOUNTER — Encounter: Payer: Self-pay | Admitting: Family Medicine

## 2021-12-22 ENCOUNTER — Other Ambulatory Visit: Payer: Self-pay

## 2021-12-22 DIAGNOSIS — R6510 Systemic inflammatory response syndrome (SIRS) of non-infectious origin without acute organ dysfunction: Secondary | ICD-10-CM

## 2021-12-22 DIAGNOSIS — G4733 Obstructive sleep apnea (adult) (pediatric): Secondary | ICD-10-CM | POA: Diagnosis present

## 2021-12-22 DIAGNOSIS — A419 Sepsis, unspecified organism: Principal | ICD-10-CM | POA: Diagnosis present

## 2021-12-22 DIAGNOSIS — I4819 Other persistent atrial fibrillation: Secondary | ICD-10-CM | POA: Diagnosis present

## 2021-12-22 DIAGNOSIS — K859 Acute pancreatitis without necrosis or infection, unspecified: Secondary | ICD-10-CM | POA: Diagnosis present

## 2021-12-22 DIAGNOSIS — Z9841 Cataract extraction status, right eye: Secondary | ICD-10-CM | POA: Diagnosis not present

## 2021-12-22 DIAGNOSIS — Z85528 Personal history of other malignant neoplasm of kidney: Secondary | ICD-10-CM | POA: Diagnosis not present

## 2021-12-22 DIAGNOSIS — I129 Hypertensive chronic kidney disease with stage 1 through stage 4 chronic kidney disease, or unspecified chronic kidney disease: Secondary | ICD-10-CM | POA: Diagnosis present

## 2021-12-22 DIAGNOSIS — Z79899 Other long term (current) drug therapy: Secondary | ICD-10-CM

## 2021-12-22 DIAGNOSIS — Z9079 Acquired absence of other genital organ(s): Secondary | ICD-10-CM | POA: Diagnosis not present

## 2021-12-22 DIAGNOSIS — I2510 Atherosclerotic heart disease of native coronary artery without angina pectoris: Secondary | ICD-10-CM | POA: Diagnosis present

## 2021-12-22 DIAGNOSIS — R509 Fever, unspecified: Secondary | ICD-10-CM

## 2021-12-22 DIAGNOSIS — D696 Thrombocytopenia, unspecified: Secondary | ICD-10-CM | POA: Diagnosis present

## 2021-12-22 DIAGNOSIS — N1831 Chronic kidney disease, stage 3a: Secondary | ICD-10-CM | POA: Diagnosis present

## 2021-12-22 DIAGNOSIS — I482 Chronic atrial fibrillation, unspecified: Secondary | ICD-10-CM | POA: Diagnosis not present

## 2021-12-22 DIAGNOSIS — A084 Viral intestinal infection, unspecified: Secondary | ICD-10-CM | POA: Diagnosis present

## 2021-12-22 DIAGNOSIS — N189 Chronic kidney disease, unspecified: Secondary | ICD-10-CM | POA: Diagnosis not present

## 2021-12-22 DIAGNOSIS — Z91040 Latex allergy status: Secondary | ICD-10-CM | POA: Diagnosis not present

## 2021-12-22 DIAGNOSIS — Z888 Allergy status to other drugs, medicaments and biological substances status: Secondary | ICD-10-CM

## 2021-12-22 DIAGNOSIS — Z8249 Family history of ischemic heart disease and other diseases of the circulatory system: Secondary | ICD-10-CM | POA: Diagnosis not present

## 2021-12-22 DIAGNOSIS — Z9842 Cataract extraction status, left eye: Secondary | ICD-10-CM | POA: Diagnosis not present

## 2021-12-22 DIAGNOSIS — Z96641 Presence of right artificial hip joint: Secondary | ICD-10-CM | POA: Diagnosis present

## 2021-12-22 DIAGNOSIS — R6883 Chills (without fever): Secondary | ICD-10-CM

## 2021-12-22 DIAGNOSIS — Z905 Acquired absence of kidney: Secondary | ICD-10-CM

## 2021-12-22 DIAGNOSIS — E785 Hyperlipidemia, unspecified: Secondary | ICD-10-CM | POA: Diagnosis present

## 2021-12-22 DIAGNOSIS — Z20822 Contact with and (suspected) exposure to covid-19: Secondary | ICD-10-CM | POA: Diagnosis present

## 2021-12-22 DIAGNOSIS — Z87891 Personal history of nicotine dependence: Secondary | ICD-10-CM | POA: Diagnosis not present

## 2021-12-22 DIAGNOSIS — I493 Ventricular premature depolarization: Secondary | ICD-10-CM | POA: Diagnosis present

## 2021-12-22 DIAGNOSIS — N179 Acute kidney failure, unspecified: Secondary | ICD-10-CM | POA: Diagnosis present

## 2021-12-22 LAB — URINALYSIS, ROUTINE W REFLEX MICROSCOPIC
Bacteria, UA: NONE SEEN
Bilirubin Urine: NEGATIVE
Glucose, UA: NEGATIVE mg/dL
Hgb urine dipstick: NEGATIVE
Ketones, ur: NEGATIVE mg/dL
Leukocytes,Ua: NEGATIVE
Nitrite: NEGATIVE
Protein, ur: 30 mg/dL — AB
Specific Gravity, Urine: 1.023 (ref 1.005–1.030)
pH: 5 (ref 5.0–8.0)

## 2021-12-22 LAB — CBC
HCT: 42.2 % (ref 39.0–52.0)
Hemoglobin: 13.1 g/dL (ref 13.0–17.0)
MCH: 30.5 pg (ref 26.0–34.0)
MCHC: 31 g/dL (ref 30.0–36.0)
MCV: 98.4 fL (ref 80.0–100.0)
Platelets: 83 10*3/uL — ABNORMAL LOW (ref 150–400)
RBC: 4.29 MIL/uL (ref 4.22–5.81)
RDW: 15 % (ref 11.5–15.5)
WBC: 13.3 10*3/uL — ABNORMAL HIGH (ref 4.0–10.5)
nRBC: 0 % (ref 0.0–0.2)

## 2021-12-22 LAB — BASIC METABOLIC PANEL
Anion gap: 8 (ref 5–15)
BUN: 45 mg/dL — ABNORMAL HIGH (ref 8–23)
CO2: 24 mmol/L (ref 22–32)
Calcium: 9.7 mg/dL (ref 8.9–10.3)
Chloride: 106 mmol/L (ref 98–111)
Creatinine, Ser: 1.47 mg/dL — ABNORMAL HIGH (ref 0.61–1.24)
GFR, Estimated: 46 mL/min — ABNORMAL LOW (ref >60)
Glucose, Bld: 107 mg/dL — ABNORMAL HIGH (ref 70–99)
Potassium: 4.4 mmol/L (ref 3.5–5.1)
Sodium: 138 mmol/L (ref 135–145)

## 2021-12-22 LAB — GASTROINTESTINAL PANEL BY PCR, STOOL (REPLACES STOOL CULTURE)

## 2021-12-22 LAB — PROTIME-INR
INR: 1.9 — ABNORMAL HIGH (ref 0.8–1.2)
Prothrombin Time: 21.6 seconds — ABNORMAL HIGH (ref 11.4–15.2)

## 2021-12-22 LAB — DIFFERENTIAL
Abs Immature Granulocytes: 0.05 10*3/uL (ref 0.00–0.07)
Basophils Absolute: 0 10*3/uL (ref 0.0–0.1)
Basophils Relative: 0 %
Eosinophils Absolute: 0.1 10*3/uL (ref 0.0–0.5)
Eosinophils Relative: 1 %
Immature Granulocytes: 0 %
Lymphocytes Relative: 8 %
Lymphs Abs: 1.1 10*3/uL (ref 0.7–4.0)
Monocytes Absolute: 0.9 10*3/uL (ref 0.1–1.0)
Monocytes Relative: 7 %
Neutro Abs: 11 10*3/uL — ABNORMAL HIGH (ref 1.7–7.7)
Neutrophils Relative %: 84 %

## 2021-12-22 LAB — RESP PANEL BY RT-PCR (FLU A&B, COVID) ARPGX2
Influenza A by PCR: NEGATIVE
Influenza B by PCR: NEGATIVE
SARS Coronavirus 2 by RT PCR: NEGATIVE

## 2021-12-22 LAB — MRSA NEXT GEN BY PCR, NASAL: MRSA by PCR Next Gen: NOT DETECTED

## 2021-12-22 LAB — C DIFFICILE QUICK SCREEN W PCR REFLEX
C Diff antigen: NEGATIVE
C Diff interpretation: NOT DETECTED
C Diff toxin: NEGATIVE

## 2021-12-22 LAB — LACTIC ACID, PLASMA
Lactic Acid, Venous: 1.1 mmol/L (ref 0.5–1.9)
Lactic Acid, Venous: 2 mmol/L (ref 0.5–1.9)
Lactic Acid, Venous: 2.3 mmol/L (ref 0.5–1.9)

## 2021-12-22 LAB — T4, FREE: Free T4: 1.15 ng/dL — ABNORMAL HIGH (ref 0.61–1.12)

## 2021-12-22 LAB — PROCALCITONIN: Procalcitonin: 2.39 ng/mL

## 2021-12-22 LAB — TROPONIN I (HIGH SENSITIVITY)
Troponin I (High Sensitivity): 9 ng/L (ref <18)
Troponin I (High Sensitivity): 10 ng/L (ref <18)

## 2021-12-22 LAB — HEPATIC FUNCTION PANEL
ALT: 33 U/L (ref 0–44)
AST: 32 U/L (ref 15–41)
Albumin: 4.2 g/dL (ref 3.5–5.0)
Alkaline Phosphatase: 131 U/L — ABNORMAL HIGH (ref 38–126)
Bilirubin, Direct: 0.7 mg/dL — ABNORMAL HIGH (ref 0.0–0.2)
Indirect Bilirubin: 1.4 mg/dL — ABNORMAL HIGH (ref 0.3–0.9)
Total Bilirubin: 2.1 mg/dL — ABNORMAL HIGH (ref 0.3–1.2)
Total Protein: 7.1 g/dL (ref 6.5–8.1)

## 2021-12-22 LAB — LIPASE, BLOOD: Lipase: 55 U/L — ABNORMAL HIGH (ref 11–51)

## 2021-12-22 LAB — TSH: TSH: 2.799 u[IU]/mL (ref 0.350–4.500)

## 2021-12-22 LAB — SEDIMENTATION RATE: Sed Rate: 5 mm/hr (ref 0–20)

## 2021-12-22 MED ORDER — ONDANSETRON HCL 4 MG/2ML IJ SOLN: 4.0000 mg | Freq: Four times a day (QID) | INTRAMUSCULAR | Status: DC | PRN

## 2021-12-22 MED ORDER — MAGNESIUM HYDROXIDE 400 MG/5ML PO SUSP: 30.0000 mL | Freq: Every day | ORAL | Status: DC | PRN

## 2021-12-22 MED ORDER — ENOXAPARIN SODIUM 40 MG/0.4ML IJ SOSY
40.0000 mg | PREFILLED_SYRINGE | INTRAMUSCULAR | Status: DC
  Administered 2021-12-22: 40 mg via SUBCUTANEOUS
  Filled 2021-12-22: qty 0.4

## 2021-12-22 MED ORDER — ACETAMINOPHEN 500 MG PO TABS
1000.0000 mg | ORAL_TABLET | Freq: Once | ORAL | Status: AC
Start: 2021-12-22 — End: 2021-12-22
  Administered 2021-12-22: 1000 mg via ORAL
  Filled 2021-12-22: qty 2

## 2021-12-22 MED ORDER — METRONIDAZOLE 500 MG/100ML IV SOLN
500.0000 mg | Freq: Two times a day (BID) | INTRAVENOUS | Status: DC
  Administered 2021-12-22 – 2021-12-23 (×2): 500 mg via INTRAVENOUS
  Filled 2021-12-22 (×2): qty 100

## 2021-12-22 MED ORDER — SODIUM CHLORIDE 0.9 % IV SOLN
2.0000 g | Freq: Two times a day (BID) | INTRAVENOUS | Status: DC
  Administered 2021-12-23 (×2): 2 g via INTRAVENOUS
  Filled 2021-12-22 (×2): qty 12.5
  Filled 2021-12-22: qty 2

## 2021-12-22 MED ORDER — SODIUM CHLORIDE 0.9 % IV SOLN: INTRAVENOUS | Status: DC

## 2021-12-22 MED ORDER — OCUVITE-LUTEIN PO CAPS
1.0000 | ORAL_CAPSULE | Freq: Two times a day (BID) | ORAL | Status: DC
  Administered 2021-12-23 – 2021-12-24 (×3): 1 via ORAL
  Filled 2021-12-22 (×3): qty 1

## 2021-12-22 MED ORDER — IOHEXOL 9 MG/ML PO SOLN
500.0000 mL | ORAL | Status: AC
  Administered 2021-12-22 (×2): 500 mL via ORAL

## 2021-12-22 MED ORDER — ACETAMINOPHEN 650 MG RE SUPP: 650.0000 mg | Freq: Four times a day (QID) | RECTAL | Status: DC | PRN

## 2021-12-22 MED ORDER — METOPROLOL SUCCINATE ER 25 MG PO TB24
12.5000 mg | ORAL_TABLET | Freq: Every day | ORAL | Status: DC
  Administered 2021-12-23 – 2021-12-24 (×2): 12.5 mg via ORAL
  Filled 2021-12-22: qty 0.5
  Filled 2021-12-22 (×2): qty 1

## 2021-12-22 MED ORDER — SODIUM CHLORIDE 0.9 % IV BOLUS
1000.0000 mL | Freq: Once | INTRAVENOUS | Status: AC
  Administered 2021-12-22: 1000 mL via INTRAVENOUS

## 2021-12-22 MED ORDER — PIPERACILLIN-TAZOBACTAM 3.375 G IVPB 30 MIN
3.3750 g | Freq: Once | INTRAVENOUS | Status: AC
Start: 2021-12-22 — End: 2021-12-22
  Administered 2021-12-22: 3.375 g via INTRAVENOUS
  Filled 2021-12-22: qty 50

## 2021-12-22 MED ORDER — ACETAMINOPHEN 325 MG PO TABS: 650.0000 mg | ORAL_TABLET | Freq: Four times a day (QID) | ORAL | Status: DC | PRN

## 2021-12-22 MED ORDER — VANCOMYCIN HCL IN DEXTROSE 1-5 GM/200ML-% IV SOLN
1000.0000 mg | Freq: Once | INTRAVENOUS | Status: AC
  Administered 2021-12-22: 1000 mg via INTRAVENOUS
  Filled 2021-12-22: qty 200

## 2021-12-22 MED ORDER — VANCOMYCIN VARIABLE DOSE PER UNSTABLE RENAL FUNCTION (PHARMACIST DOSING): Status: DC

## 2021-12-22 MED ORDER — TRAZODONE HCL 50 MG PO TABS: 25.0000 mg | ORAL_TABLET | Freq: Every evening | ORAL | Status: DC | PRN

## 2021-12-22 MED ORDER — SODIUM CHLORIDE 0.9 % IV SOLN
2.0000 g | Freq: Two times a day (BID) | INTRAVENOUS | Status: DC
  Administered 2021-12-22: 2 g via INTRAVENOUS
  Filled 2021-12-22: qty 2
  Filled 2021-12-22: qty 12.5

## 2021-12-22 MED ORDER — SIMVASTATIN 20 MG PO TABS
40.0000 mg | ORAL_TABLET | Freq: Every morning | ORAL | Status: DC
Start: 2021-12-22 — End: 2021-12-24
  Administered 2021-12-22 – 2021-12-23 (×2): 40 mg via ORAL
  Filled 2021-12-22 (×2): qty 2

## 2021-12-22 MED ORDER — ONDANSETRON HCL 4 MG PO TABS: 4.0000 mg | ORAL_TABLET | Freq: Four times a day (QID) | ORAL | Status: DC | PRN

## 2021-12-22 MED ORDER — SODIUM CHLORIDE 0.9 % IV SOLN: 2.0000 g | Freq: Once | INTRAVENOUS | Status: DC

## 2021-12-22 NOTE — Consult Note (Signed)
Pharmacy Antibiotic Note ? ?Kyle Gutierrez is a 88 y.o. male admitted on 12/22/2021 with sepsis.  Pharmacy has been consulted for Cefepime and Vancomycin dosing. ? ?4/22: Zosyn 3.75 x1, Vancomycin 1g x1, Metronidazole 500mg  IV q12 ? ?Plan: ?Give additional 1g vancomycin IV (for total of 2gm) once followed by dosing per vancomycin random levels in the setting of AKI ?Start Cefepime 2g every 12 hours following last Zosyn dose ?Continue to monitor renal function daily and dose adjust antibiotics accordingly ? ? ?Height: 6' (182.9 cm) ?Weight: 85.3 kg (188 lb) ?IBW/kg (Calculated) : 77.6 ? ?Temp (24hrs), Avg:99.2 ?F (37.3 ?C), Min:98.5 ?F (36.9 ?C), Max:100.6 ?F (38.1 ?C) ? ?Recent Labs  ?Lab 12/22/21 ?0206 12/22/21 ?1155  ?WBC 13.3*  --   ?CREATININE 1.47*  --   ?LATICACIDVEN  --  1.1  ?  ?Estimated Creatinine Clearance: 38.9 mL/min (A) (by C-G formula based on SCr of 1.47 mg/dL (H)).   ? ?Allergies  ?Allergen Reactions  ? Ace Inhibitors Cough  ?  REACTION: cough  ? Latex Rash  ? Tape Rash and Other (See Comments)  ?  OK to use paper tape ?OK to use paper tape  ? Tapentadol Rash  ? ? ?Antimicrobials this admission: ?Zosyn x1 in ED >> ?Cefepime 2gm IV q12h >> ?Metronidazole 500mg  IV q12h >> ?Vancomycin >> ? ?Dose adjustments this admission: ? ? ?Microbiology results: ?4/22 BCx: NGTD ?4/22 UCx: sent  ?4/22 MRSA PCR: ordered ? ? ?Thank you for allowing pharmacy to be a part of this pleasant patient?s care. ? ?Darrick Penna, PharmD, MS PGPM ?Clinical Pharmacist ?12/22/2021 ?7:49 AM ? ? ?

## 2021-12-22 NOTE — Assessment & Plan Note (Signed)
-   The patient will be hydrated with IV normal saline and will follow his BMP. ?- We will avoid nephrotoxins. ?

## 2021-12-22 NOTE — Assessment & Plan Note (Signed)
-   We will need to rule out sepsis.  This is manifested by fever and tachycardia. ?- The patient will be admitted to a medical telemetry bed. ?- We will follow blood cultures. ?- The patient be placed on broad-spectrum antibiotics for now with IV vancomycin, cefepime and Flagyl.. ?- Urinalysis and chest x-ray were unremarkable. ?- Given mild right mid abdominal tenderness we will obtain abdominal and pelvic CT scan with only p.o. contrast, especially with abnormal MRI showing large amount of pelvic fluid in February of this year.   ?

## 2021-12-22 NOTE — Assessment & Plan Note (Addendum)
-   We will continue Toprol-XL. ?- The patient is not on anticoagulation due to fall risk. ?

## 2021-12-22 NOTE — Progress Notes (Signed)
Critical lab received from [place] with lab. Lactic 2.0. Alerted Dr. Priscella Mann.  ? ?Fuller Mandril, RN ? ?

## 2021-12-22 NOTE — ED Notes (Signed)
ED Provider at bedside. 

## 2021-12-22 NOTE — H&P (Addendum)
?  ?  ?[place] ? ? ?PATIENT NAME: Kyle Gutierrez   ? ?MR#:  [id number] ? ?DATE OF BIRTH:  1931-11-16 ? ?DATE OF ADMISSION:  12/22/2021 ? ?PRIMARY CARE PHYSICIAN: Dion Body, MD  ? ?Patient is coming from: Home ? ?REQUESTING/REFERRING PHYSICIAN: Lurline Hare, MD ? ?CHIEF COMPLAINT:  ? ?Chief Complaint  ?Patient presents with  ? Shaking  ? ? ?HISTORY OF PRESENT ILLNESS:  ?Kyle Gutierrez is a 88 y.o. Caucasian male with medical history significant for multiple medical problems that are mentioned below, who presented to the emergency room with acute onset of chills with tactile fever and shivering.  He went to bed at 10 PM and woke up at 11:45 PM with shaking chills.  He admits to dyspnea without significant cough or wheezing.  He denies any nausea or vomiting but has been having right upper abdominal tenderness.  Note chest pain or palpitations.  No dysuria, oliguria or hematuria or flank pain.  No recent COVID-19 exposure.  He had 2 vaccines for COVID-19. ? ?ED Course: When he came to the ER she was 100.6/38.1 with otherwise normal vital signs.  Labs revealed a BUN of 45 and a creatinine 1.47 close to previous levels, with serum lipase of 55 and total bili of 2.1 and indirect bili of 1.4.  EKG as reviewed by me : EKG showed likely slow atrial fibrillation with PVCs. ?Imaging: Chest x-ray showed cardiomegaly with no active cardiopulmonary disease. ? ?The patient was given IV vancomycin and Zosyn as well as milligram of p.o. Tylenol and 1 L bolus of IV normal saline.  He will be admitted to a medical telemetry bed for further evaluation and management. ?PAST MEDICAL HISTORY:  ? ?Past Medical History:  ?Diagnosis Date  ? Benign prostatic hypertrophy   ? s/p TURP  ? Cardiomyopathy ([place])   ? Carotid artery plaque   ? bilateral  ? CHF (congestive heart failure) ([place])   ? Chronic atrial fibrillation (HCC)   ? Complication of anesthesia   ? balance problem  ? Coronary artery disease, non-occlusive   ?  Dyspnea   ? Dysrhythmia   ? atrial fib  ? Elevated lipids   ? Gross hematuria   ? Hypertension   ? Neuropathy, alcoholic ([place])   ? per Neurologic eval, remote  ? Nocturia   ? Poor balance   ? renal cell carcinoma 2002  ? left kidney cancer with partial nephrectomy.  ? Retinopathy   ? followed by [facility]  ? Sleep apnea   ? Thrombocytopenia, unspecified ([place])   ? Urethral stricture   ? Urinary frequency   ? Valvular cardiomyopathy ([place])   ? severe mitral and tricuspid insufficiency, ECHO July 2012  ? ? ?PAST SURGICAL HISTORY:  ? ?Past Surgical History:  ?Procedure Laterality Date  ? CYSTOSCOPY W/ RETROGRADES N/A 05/06/2013  ? Procedure: CYSTOSCOPY WITH RETROGRADE PYELOGRAM,  BALLOON DILATION OF URETHRAL STRICTURE;  Surgeon: Dutch Gray, MD;  Location: [facility];  Service: Urology;  Laterality: N/A;  ? EYE SURGERY    ? bilateral cataracts removed  ? left rotator cuff repair    ? PARTIAL NEPHRECTOMY Left   ? 10'02  ? PROSTATE SURGERY    ? ROTATOR CUFF REPAIR Right   ? SHOULDER ARTHROSCOPY WITH OPEN ROTATOR CUFF REPAIR Left 07/02/2018  ? Procedure: SHOULDER ARTHROSCOPY WITH MINI OPEN ROTATOR CUFF REPAIR INCLUDING SUBSCAPULARIS ,SUBACROMINAL DECOMPRESSION.;  Surgeon: Thornton Park, MD;  Location: [facility];  Service: Orthopedics;  Laterality: Left;  ? TOTAL HIP ARTHROPLASTY  Right 06/19/2017  ? Procedure: TOTAL HIP ARTHROPLASTY ANTERIOR APPROACH;  Surgeon: Hessie Knows, MD;  Location: [facility];  Service: Orthopedics;  Laterality: Right;  ? TRANSURETHRAL RESECTION OF PROSTATE  2003  ? ? ?SOCIAL HISTORY:  ? ?Social History  ? ?Tobacco Use  ? Smoking status: Former  ?  Types: Cigarettes  ?  Quit date: 03/29/1977  ?  Years since quitting: 44.7  ? Smokeless tobacco: Never  ?Substance Use Topics  ? Alcohol use: Yes  ?  Comment: daily 5-6 ounces wine or whiskey   ? ? ?FAMILY HISTORY:  ? ?Family History  ?Problem Relation Age of Onset  ? Heart disease Mother 50  ?     heart failure  ? Early death Father 31  ? Heart disease Father   ?      CAD  ? Cancer Sister   ?     Breast  ? ? ?DRUG ALLERGIES:  ? ?Allergies  ?Allergen Reactions  ? Ace Inhibitors Cough  ?  REACTION: cough  ? Latex Rash  ? Tape Rash and Other (See Comments)  ?  OK to use paper tape ?OK to use paper tape  ? Tapentadol Rash  ? ? ?REVIEW OF SYSTEMS:  ? ?ROS ?As per history of present illness. All pertinent systems were reviewed above. Constitutional, HEENT, cardiovascular, respiratory, GI, GU, musculoskeletal, neuro, psychiatric, endocrine, integumentary and hematologic systems were reviewed and are otherwise negative/unremarkable except for positive findings mentioned above in the HPI. ? ? ?MEDICATIONS AT HOME:  ? ?Prior to Admission medications   ?Medication Sig Start Date End Date Taking? Authorizing Provider  ?acetaminophen (TYLENOL) 500 MG tablet Take 1,000 mg by mouth 2 (two) times daily.     [provider]  ?losartan-hydrochlorothiazide (HYZAAR) 100-12.5 MG per tablet Take 1 tablet by mouth daily.     [provider]  ?metoprolol succinate (TOPROL-XL) 25 MG 24 hr tablet Take 12.5 mg by mouth daily.     [provider]  ?Multiple Vitamins-Minerals (PRESERVISION AREDS 2 PO) Take 1 capsule by mouth 2 (two) times daily.    [provider]  ?simvastatin (ZOCOR) 40 MG tablet Take 1 tablet (40 mg total) by mouth every morning. 08/13/13   Crecencio Mc, MD  ?spironolactone (ALDACTONE) 25 MG tablet Take 1 tablet by mouth daily. 05/09/20   [provider]  ? ?  ? ?VITAL SIGNS:  ?Blood pressure (!) 93/53, pulse 93, temperature 98.2 ?F (36.8 ?C), temperature source Oral, resp. rate 18, height 6' (1.829 m), weight 85.3 kg, SpO2 95 %. ? ?PHYSICAL EXAMINATION:  ?Physical Exam ? ?GENERAL:  88 y.o.-year-old Caucasian male patient lying in the bed with no acute distress.  ?EYES: Pupils equal, round, reactive to light and accommodation. No scleral icterus. Extraocular muscles intact.  ?HEENT: Head atraumatic, normocephalic. Oropharynx and  nasopharynx clear.  ?NECK:  Supple, no jugular venous distention. No thyroid enlargement, no tenderness.  ?LUNGS: Normal breath sounds bilaterally, no wheezing, rales,rhonchi or crepitation. No use of accessory muscles of respiration.  ?CARDIOVASCULAR: Regular rate and rhythm, S1, S2 normal. No murmurs, rubs, or gallops.  ?ABDOMEN: Soft, nondistended, nontender. Bowel sounds present. No organomegaly or mass.  ?EXTREMITIES: No pedal edema, cyanosis, or clubbing.  ?NEUROLOGIC: Cranial nerves II through XII are intact. Muscle strength 5/5 in all extremities. Sensation intact. Gait not checked.  ?PSYCHIATRIC: The patient is alert and oriented x 3.  Normal affect and good eye contact. ?SKIN: No obvious rash, lesion, or ulcer.  ? ?LABORATORY PANEL:  ? ?  CBC ?Recent Labs  ?Lab 12/22/21 ?0206  ?WBC 13.3*  ?HGB 13.1  ?HCT 42.2  ?PLT 83*  ? ?------------------------------------------------------------------------------------------------------------------ ? ?Chemistries  ?Recent Labs  ?Lab 12/22/21 ?0206  ?NA 138  ?K 4.4  ?CL 106  ?CO2 24  ?GLUCOSE 107*  ?BUN 45*  ?CREATININE 1.47*  ?CALCIUM 9.7  ?AST 32  ?ALT 33  ?ALKPHOS 131*  ?BILITOT 2.1*  ? ?------------------------------------------------------------------------------------------------------------------ ? ?Cardiac Enzymes ?No results for input(s): TROPONINI in the last 168 hours. ?------------------------------------------------------------------------------------------------------------------ ? ?RADIOLOGY:  ?DG Chest Port 1 View ? ?Result Date: 12/22/2021 ?CLINICAL DATA:  Shaking EXAM: PORTABLE CHEST 1 VIEW COMPARISON:  07/02/2018 FINDINGS: Cardiomegaly. No confluent opacities, effusions or edema. No acute bony abnormality. IMPRESSION: Cardiomegaly.  No active disease. Electronically Signed   By: Rolm Baptise M.D.   On: 12/22/2021 02:53  ? ?US ABDOMEN LIMITED RUQ (LIVER/GB) ? ?Result Date: 12/22/2021 ?CLINICAL DATA:  88 year old male with history of elevated liver function  tests. Abdominal pain. EXAM: ULTRASOUND ABDOMEN LIMITED RIGHT UPPER QUADRANT COMPARISON:  No prior. FINDINGS: Gallbladder: Gallbladder is nearly contracted. Gallbladder wall thickness is increased at 8 mm. No galls

## 2021-12-22 NOTE — ED Provider Notes (Signed)
? ?[facility] ?Provider Note ? ? ? Event Date/Time  ? First MD Initiated Contact with Patient 12/22/21 0226   ?  (approximate) ? ? ?History  ? ?Shaking ? ? ?HPI ? ?Kyle Gutierrez is a 88 y.o. male who presents to the ED from [facility] independent living with a chief complaint of shaking chills.  Patient states he awoke approximately 1 hour prior to arrival with chills.  Has noticed some "warmth" in his chest over the past few days.  Otherwise denies fever, cough, chest pain, shortness of breath, abdominal pain, nausea, vomiting, dysuria or diarrhea.  Denies COVID exposure. ?  ? ? ?Past Medical History  ? ?Past Medical History:  ?Diagnosis Date  ? Benign prostatic hypertrophy   ? s/p TURP  ? Cardiomyopathy ([place])   ? Carotid artery plaque   ? bilateral  ? CHF (congestive heart failure) ([place])   ? Chronic atrial fibrillation (HCC)   ? Complication of anesthesia   ? balance problem  ? Coronary artery disease, non-occlusive   ? Dyspnea   ? Dysrhythmia   ? atrial fib  ? Elevated lipids   ? Gross hematuria   ? Hypertension   ? Neuropathy, alcoholic ([place])   ? per Neurologic eval, remote  ? Nocturia   ? Poor balance   ? renal cell carcinoma 2002  ? left kidney cancer with partial nephrectomy.  ? Retinopathy   ? followed by [facility]  ? Sleep apnea   ? Thrombocytopenia, unspecified ([place])   ? Urethral stricture   ? Urinary frequency   ? Valvular cardiomyopathy ([place])   ? severe mitral and tricuspid insufficiency, ECHO July 2012  ? ? ? ?Active Problem List  ? ?Patient Active Problem List  ? Diagnosis Date Noted  ? AKI (acute kidney injury) ([place]) 12/22/2021  ? Stage 3a chronic kidney disease ([place]) 07/13/2019  ? Full thickness rotator cuff tear 01/11/2019  ? Disorder of bursae of shoulder region 01/11/2019  ? Total knee replacement status 07/04/2018  ? S/P left rotator cuff repair 07/02/2018  ? Localized, primary osteoarthritis of shoulder region 03/24/2018  ? Venous stasis 07/07/2017  ? Primary  localized osteoarthritis of right hip 06/19/2017  ? B12 deficiency 01/22/2017  ? Urethral stricture 07/30/2016  ? Moderate tricuspid insufficiency 03/26/2016  ? Bilateral carotid artery stenosis 08/10/2014  ? Moderate mitral valve regurgitation 08/10/2014  ? Bilateral hearing loss 07/05/2014  ? Edema 05/31/2014  ? Pain in limb 05/10/2014  ? S/P rotator cuff surgery 05/03/2014  ? Dizziness and giddiness 05/02/2014  ? Pure hypercholesterolemia 02/04/2014  ? Cough 08/15/2013  ? History of tobacco abuse 08/13/2013  ? OSA (obstructive sleep apnea) 06/01/2013  ? Pyelonephritis 05/20/2013  ? Myoclonic jerking while sleeping 05/20/2013  ? Routine general medical examination at a health care facility 04/16/2013  ? Long term (current) use of anticoagulants 08/11/2012  ? Hematuria, gross 04/11/2012  ? Chronic atrial fibrillation (HCC)   ? Coronary artery disease, non-occlusive   ? Carotid artery plaque   ? Valvular cardiomyopathy ([place])   ? renal cell carcinoma   ? Benign prostatic hyperplasia   ? Screening for colon cancer 02/14/2012  ? Retinopathy   ? Pulmonary nodule/lesion, solitary 08/16/2011  ? Substance abuse ([place])   ? Thrombocytopenia, unspecified ([place])   ? Neuropathy, alcoholic ([place])   ? Peripheral vascular disease ([place]) 08/06/2011  ? LUMBAR RADICULOPATHY, LEFT 05/02/2009  ? PERIPHERAL NEUROPATHY 01/05/2009  ? HYPERTENSION 12/05/2008  ? ATRIAL FIBRILLATION 12/05/2008  ? SKIN CANCER,  HX OF 12/05/2008  ? COLONIC POLYPS, HX OF 12/05/2008  ? DIVERTICULITIS, HX OF 12/05/2008  ? ? ? ?Past Surgical History  ? ?Past Surgical History:  ?Procedure Laterality Date  ? CYSTOSCOPY W/ RETROGRADES N/A 05/06/2013  ? Procedure: CYSTOSCOPY WITH RETROGRADE PYELOGRAM,  BALLOON DILATION OF URETHRAL STRICTURE;  Surgeon: Dutch Gray, MD;  Location: [facility];  Service: Urology;  Laterality: N/A;  ? EYE SURGERY    ? bilateral cataracts removed  ? left rotator cuff repair    ? PARTIAL NEPHRECTOMY Left   ? 10'02  ? PROSTATE SURGERY    ? ROTATOR CUFF  REPAIR Right   ? SHOULDER ARTHROSCOPY WITH OPEN ROTATOR CUFF REPAIR Left 07/02/2018  ? Procedure: SHOULDER ARTHROSCOPY WITH MINI OPEN ROTATOR CUFF REPAIR INCLUDING SUBSCAPULARIS ,SUBACROMINAL DECOMPRESSION.;  Surgeon: Thornton Park, MD;  Location: [facility];  Service: Orthopedics;  Laterality: Left;  ? TOTAL HIP ARTHROPLASTY Right 06/19/2017  ? Procedure: TOTAL HIP ARTHROPLASTY ANTERIOR APPROACH;  Surgeon: Hessie Knows, MD;  Location: [facility];  Service: Orthopedics;  Laterality: Right;  ? TRANSURETHRAL RESECTION OF PROSTATE  2003  ? ? ? ?Home Medications  ? ?Prior to Admission medications   ?Medication Sig Start Date End Date Taking? Authorizing Provider  ?acetaminophen (TYLENOL) 500 MG tablet Take 1,000 mg by mouth 2 (two) times daily.     [provider]  ?losartan-hydrochlorothiazide (HYZAAR) 100-12.5 MG per tablet Take 1 tablet by mouth daily.     [provider]  ?metoprolol succinate (TOPROL-XL) 25 MG 24 hr tablet Take 12.5 mg by mouth daily.     [provider]  ?Multiple Vitamins-Minerals (PRESERVISION AREDS 2 PO) Take 1 capsule by mouth 2 (two) times daily.    [provider]  ?simvastatin (ZOCOR) 40 MG tablet Take 1 tablet (40 mg total) by mouth every morning. 08/13/13   Crecencio Mc, MD  ?spironolactone (ALDACTONE) 25 MG tablet Take 1 tablet by mouth daily. 05/09/20   [provider]  ? ? ? ?Allergies  ?Ace inhibitors, Latex, Tape, and Tapentadol ? ? ?Family History  ? ?Family History  ?Problem Relation Age of Onset  ? Heart disease Mother 70  ?     heart failure  ? Early death Father 41  ? Heart disease Father   ?     CAD  ? Cancer Sister   ?     Breast  ? ? ? ?Physical Exam  ?Triage Vital Signs: ?ED Triage Vitals  ?Enc Vitals Group  ?   BP 12/22/21 0158 128/61  ?   Pulse Rate 12/22/21 0158 (!) 116  ?   Resp 12/22/21 0158 18  ?   Temp 12/22/21 0159 98.5 ?F (36.9 ?C)  ?   Temp Source 12/22/21 0158 Oral  ?   SpO2 12/22/21 0158 96 %  ?   Weight 12/22/21 0158  188 lb (85.3 kg)  ?   Height 12/22/21 0158 6' (1.829 m)  ?   Head Circumference --   ?   Peak Flow --   ?   Pain Score 12/22/21 0158 0  ?   Pain Loc --   ?   Pain Edu? --   ?   Excl. in [place]? --   ? ? ?Updated Vital Signs: ?BP 109/79   Pulse 88   Temp (!) 100.6 ?F (38.1 ?C) (Oral)   Resp 17   Ht 6' (1.829 m)   Wt 85.3 kg   SpO2 98%   BMI 25.50 kg/m?  ? ? ?  General: Awake, no distress.  ?CV:  RRR.  Good peripheral perfusion.  ?Resp:  Normal effort.  CTA B. ?Abd:  Nontender to light or deep palpation.  No distention.  ?Other:  No vesicles.  Supple neck without meningismus. ? ? ?ED Results / Procedures / Treatments  ?Labs ?(all labs ordered are listed, but only abnormal results are displayed) ?Labs Reviewed  ?BASIC METABOLIC PANEL - Abnormal; Notable for the following components:  ?    Result Value  ? Glucose, Bld 107 (*)   ? BUN 45 (*)   ? Creatinine, Ser 1.47 (*)   ? GFR, Estimated 46 (*)   ? All other components within normal limits  ?CBC - Abnormal; Notable for the following components:  ? WBC 13.3 (*)   ? Platelets 83 (*)   ? All other components within normal limits  ?DIFFERENTIAL - Abnormal; Notable for the following components:  ? Neutro Abs 11.0 (*)   ? All other components within normal limits  ?URINALYSIS, ROUTINE W REFLEX MICROSCOPIC - Abnormal; Notable for the following components:  ? Color, Urine YELLOW (*)   ? APPearance CLEAR (*)   ? Protein, ur 30 (*)   ? All other components within normal limits  ?HEPATIC FUNCTION PANEL - Abnormal; Notable for the following components:  ? Alkaline Phosphatase 131 (*)   ? Total Bilirubin 2.1 (*)   ? Bilirubin, Direct 0.7 (*)   ? Indirect Bilirubin 1.4 (*)   ? All other components within normal limits  ?LIPASE, BLOOD - Abnormal; Notable for the following components:  ? Lipase 55 (*)   ? All other components within normal limits  ?T4, FREE - Abnormal; Notable for the following components:  ? Free T4 1.15 (*)   ? All other components within normal limits  ?PROTIME-INR  - Abnormal; Notable for the following components:  ? Prothrombin Time 21.6 (*)   ? INR 1.9 (*)   ? All other components within normal limits  ?CULTURE, BLOOD (ROUTINE X 2)  ?CULTURE, BLOOD (ROUTINE X 2)  ?RESP PANE

## 2021-12-22 NOTE — ED Triage Notes (Signed)
Pt states he woke up approx one hour ago, shaking. Pt states shaking has improved, however is still present. Pt states has also had some "warmth" in his chest.  ?

## 2021-12-22 NOTE — Plan of Care (Signed)
?  Problem: Clinical Measurements: ?Goal: Ability to maintain clinical measurements within normal limits will improve ?Outcome: Progressing ?Goal: Cardiovascular complication will be avoided ?Outcome: Progressing ?  ?Problem: Activity: ?Goal: Risk for activity intolerance will decrease ?Outcome: Progressing ?  ?Problem: Safety: ?Goal: Ability to remain free from injury will improve ?Outcome: Progressing ?  ?

## 2021-12-22 NOTE — ED Triage Notes (Signed)
Pt states he woke up approx one hour ago due to shaking. Pt states shaking is improving but is still present. Pt denies fever, diabetes history and pain.  ?

## 2021-12-22 NOTE — Progress Notes (Signed)
Brief hospitalist update note.  This is a nonbillable note.  Please see same-day H&P for full billable details. ? ?Briefly, this is a 88 year old Caucasian male with history significant for congestive heart failure, chronic atrial fibrillation, coronary artery disease, history of renal cell cancer with partial nephrectomy, chronic thrombocytopenia, valvulopathy who presents to the ED with acute onset of chills with subjective fever.  Symptoms started on the day prior to admission.  Patient lives in assisted living.  Called EMS to bedside who recommended patient present to the ER.  On presentation to the ER patient does have a low-grade temperature.  He has some acute kidney injury.  Mild leukocytosis.  Admitted with working diagnosis of sepsis of unclear source.  Will continue broad-spectrum IV antibiotics.  Abdominal CT ordered.  We will follow-up results. ? ?Remainder of care per H&P from Dr. Sidney Ace ? ?Ralene Muskrat MD ? ?No charge ?

## 2021-12-22 NOTE — Assessment & Plan Note (Signed)
-   We will continue statin therapy. 

## 2021-12-23 ENCOUNTER — Encounter: Payer: Self-pay | Admitting: Family Medicine

## 2021-12-23 DIAGNOSIS — R6510 Systemic inflammatory response syndrome (SIRS) of non-infectious origin without acute organ dysfunction: Secondary | ICD-10-CM | POA: Diagnosis not present

## 2021-12-23 LAB — BASIC METABOLIC PANEL
Anion gap: 5 (ref 5–15)
BUN: 36 mg/dL — ABNORMAL HIGH (ref 8–23)
CO2: 21 mmol/L — ABNORMAL LOW (ref 22–32)
Calcium: 8.6 mg/dL — ABNORMAL LOW (ref 8.9–10.3)
Chloride: 110 mmol/L (ref 98–111)
Creatinine, Ser: 1.38 mg/dL — ABNORMAL HIGH (ref 0.61–1.24)
GFR, Estimated: 49 mL/min — ABNORMAL LOW (ref >60)
Glucose, Bld: 112 mg/dL — ABNORMAL HIGH (ref 70–99)
Potassium: 3.9 mmol/L (ref 3.5–5.1)
Sodium: 136 mmol/L (ref 135–145)

## 2021-12-23 LAB — CBC WITH DIFFERENTIAL/PLATELET
Abs Immature Granulocytes: 0.05 10*3/uL (ref 0.00–0.07)
Basophils Absolute: 0 10*3/uL (ref 0.0–0.1)
Basophils Relative: 0 %
Eosinophils Absolute: 0.2 10*3/uL (ref 0.0–0.5)
Eosinophils Relative: 2 %
HCT: 38.7 % — ABNORMAL LOW (ref 39.0–52.0)
Hemoglobin: 12.3 g/dL — ABNORMAL LOW (ref 13.0–17.0)
Immature Granulocytes: 1 %
Lymphocytes Relative: 12 %
Lymphs Abs: 1.2 10*3/uL (ref 0.7–4.0)
MCH: 30.4 pg (ref 26.0–34.0)
MCHC: 31.8 g/dL (ref 30.0–36.0)
MCV: 95.6 fL (ref 80.0–100.0)
Monocytes Absolute: 0.8 10*3/uL (ref 0.1–1.0)
Monocytes Relative: 7 %
Neutro Abs: 8.1 10*3/uL — ABNORMAL HIGH (ref 1.7–7.7)
Neutrophils Relative %: 78 %
Platelets: 82 10*3/uL — ABNORMAL LOW (ref 150–400)
RBC: 4.05 MIL/uL — ABNORMAL LOW (ref 4.22–5.81)
RDW: 15.3 % (ref 11.5–15.5)
WBC: 10.3 10*3/uL (ref 4.0–10.5)
nRBC: 0 % (ref 0.0–0.2)

## 2021-12-23 LAB — PROCALCITONIN: Procalcitonin: 2.27 ng/mL

## 2021-12-23 LAB — URINE CULTURE: Culture: <10000 — AB

## 2021-12-23 LAB — PROTIME-INR
INR: 2.2 — ABNORMAL HIGH (ref 0.8–1.2)
Prothrombin Time: 24.5 seconds — ABNORMAL HIGH (ref 11.4–15.2)

## 2021-12-23 LAB — CREATININE, SERUM
Creatinine, Ser: 1.39 mg/dL — ABNORMAL HIGH (ref 0.61–1.24)
GFR, Estimated: 49 mL/min — ABNORMAL LOW (ref >60)

## 2021-12-23 LAB — VANCOMYCIN, RANDOM: Vancomycin Rm: 12

## 2021-12-23 LAB — CORTISOL-AM, BLOOD: Cortisol - AM: 11 ug/dL (ref 6.7–22.6)

## 2021-12-23 MED ORDER — VANCOMYCIN HCL 1250 MG/250ML IV SOLN
1250.0000 mg | Freq: Once | INTRAVENOUS | Status: DC
  Filled 2021-12-23: qty 250

## 2021-12-23 MED ORDER — LOPERAMIDE HCL 2 MG PO CAPS: 2.0000 mg | ORAL_CAPSULE | ORAL | Status: DC | PRN

## 2021-12-23 MED ORDER — VANCOMYCIN HCL IN DEXTROSE 1-5 GM/200ML-% IV SOLN
1000.0000 mg | INTRAVENOUS | Status: DC
  Administered 2021-12-23: 1000 mg via INTRAVENOUS
  Filled 2021-12-23 (×3): qty 200

## 2021-12-23 MED ORDER — WARFARIN - PHARMACIST DOSING INPATIENT
Freq: Every day | Status: DC
Start: 2021-12-23 — End: 2021-12-24

## 2021-12-23 MED ORDER — WARFARIN SODIUM 2 MG PO TABS
2.0000 mg | ORAL_TABLET | Freq: Once | ORAL | Status: AC
  Administered 2021-12-23: 2 mg via ORAL
  Filled 2021-12-23: qty 1

## 2021-12-23 NOTE — Progress Notes (Signed)
Mobility Specialist - Progress Note ? ? ? 12/23/21 1300  ?Mobility  ?Activity Ambulated independently in hallway;Stood at bedside;Dangled on edge of bed  ?Level of Assistance Independent  ?Assistive Device Front wheel walker  ?Distance Ambulated (ft) 160 ft  ?Activity Response Tolerated well  ?$Mobility charge 1 Mobility  ? ? ? ? ?During mobility: 96-100 HR,  95-96% SpO2 ? ?Pt standing with wife upon arrival using RA. Pt ambulates 159ft indep voicing no complaints and returns to bed with needs in reach and family at bedside. ? ?Merrily Brittle ?Mobility Specialist ?12/23/21, 1:04 PM ? ? ? ? ?

## 2021-12-23 NOTE — Consult Note (Signed)
ANTICOAGULATION CONSULT NOTE ? ?Pharmacy Consult for Warfarin ?Indication: atrial fibrillation ? ?Patient Measurements: ?Height: 6' (182.9 cm) ?Weight: 85.3 kg (188 lb) ?IBW/kg (Calculated) : 77.6 ? ?Labs: ?Recent Labs  ?  12/22/21 ?0206 12/22/21 ?4825 12/23/21 ?0420  ?HGB 13.1  --   --   ?HCT 42.2  --   --   ?PLT 83*  --   --   ?LABPROT 21.6*  --  24.5*  ?INR 1.9*  --  2.2*  ?CREATININE 1.47*  --  1.39*  ?TROPONINIHS 9 10  --   ? ? ?Estimated Creatinine Clearance: 41.1 mL/min (A) (by C-G formula based on SCr of 1.39 mg/dL (H)). ? ? ?Medical History: ?Past Medical History:  ?Diagnosis Date  ? Benign prostatic hypertrophy   ? s/p TURP  ? Cardiomyopathy ([place])   ? Carotid artery plaque   ? bilateral  ? CHF (congestive heart failure) ([place])   ? Chronic atrial fibrillation (HCC)   ? Complication of anesthesia   ? balance problem  ? Coronary artery disease, non-occlusive   ? Dyspnea   ? Dysrhythmia   ? atrial fib  ? Elevated lipids   ? Gross hematuria   ? Hypertension   ? Neuropathy, alcoholic ([place])   ? per Neurologic eval, remote  ? Nocturia   ? Poor balance   ? renal cell carcinoma 2002  ? left kidney cancer with partial nephrectomy.  ? Retinopathy   ? followed by [facility]  ? Sleep apnea   ? Thrombocytopenia, unspecified ([place])   ? Urethral stricture   ? Urinary frequency   ? Valvular cardiomyopathy ([place])   ? severe mitral and tricuspid insufficiency, ECHO July 2012  ? ? ?Medications:  ?Warfarin 2 mg TuThSa and 3 mg SuMoWeFr ? ?Assessment: ?Patient is an 88 y/o M with medical history as above and including Afib on warfarin and alcohol use disorder who is admitted with SIRS / sepsis rule out. Pharmacy consulted to manage warfarin while patient is admitted. ? ?DDI: Cefepime, metronidazole ? ?CBC notable for stable thrombocytopenia ? ?Date INR Plan  ?4/22 1.9 Held  ?4/23 2.2 2 mg  ? ?Goal of Therapy:  ?INR 2-3 ? ?Plan:  ?--Give above drug interactions, will give warfarin 2 mg tonight instead of home dose of 3 mg ?--Daily INR  per protocol ?--CBC at least every 3 days per protocol ? ?Benita Gutter ?12/23/2021,7:27 AM ? ? ?

## 2021-12-23 NOTE — Progress Notes (Signed)
?PROGRESS NOTE ? ? ? ?Kyle Gutierrez  YTK:[id number] DOB: 09-02-1932 DOA: 12/22/2021 ?PCP: Dion Body, MD  ? ? ?Brief Narrative:  ?88 year old Caucasian male with history significant for congestive heart failure, chronic atrial fibrillation, coronary artery disease, history of renal cell cancer with partial nephrectomy, chronic thrombocytopenia, valvulopathy who presents to the ED with acute onset of chills with subjective fever.  Symptoms started on the day prior to admission.  Patient lives in assisted living.  Called EMS to bedside who recommended patient present to the ER.  On presentation to the ER patient does have a low-grade temperature.  He has some acute kidney injury.  Mild leukocytosis.  Admitted with working diagnosis of sepsis of unclear source.  ? ?Patient clinically improving over interval.  No fevers or chills noted.  Feels better overall.  Stool study negative for infectious diarrhea.  No clear source of infection.  Evidence of mild peripancreatic fat stranding on CT of unclear etiology.  Clinically consistent with acute pancreatitis.  Procalcitonin elevated but downtrending. ? ? ?Assessment & Plan: ?  ?Principal Problem: ?  SIRS (systemic inflammatory response syndrome) (HCC) ?Active Problems: ?  Acute kidney injury superimposed on chronic kidney disease ([place]) ?  Atrial fibrillation, chronic ([place]) ?  Dyslipidemia ? ?Suspected sepsis of unclear source ?Suspect infectious source but unclear ?Sepsis physiology manifested by fever and tachycardia ?CT abdomen pelvis negative for infectious focus ?Urinalysis CXR unremarkable ?Plan: ?Continue broad-spectrum antibiotics for today ?Monitor vitals and fever curve ?Follow cultures, no growth to date ?If patient continues to clinically improve consider transition to oral antibiotics in preparation for discharge as of 4/24 ? ?AKI on CKD stage IIIa ?Improving over interval ?Making good urine ?Stop IV fluids ?Encourage p.o. intake ?Avoid nonessential  nephrotoxins ? ?Chronic atrial fibrillation ?Bradycardia with pauses ?Noted/20 2 PM ?Cardiology engaged ?Patient asymptomatic ?Plan: ?Continue Toprol-XL with holding parameters ?Resume home warfarin with pharmacy dosing assistance ?Daily INR ? ?Hyperlipidemia ?PTA statin ? ? ? ?DVT prophylaxis: Warfarin ?Code Status: Full ?Family Communication: Wife at bedside 4/23 ?Disposition Plan: Status is: Inpatient ?Remains inpatient appropriate because: Suspected sepsis of unclear source.  Clinically improving.  Possible discharge in 24 to 48 hours. ? ? ?Level of care: Telemetry Medical ? ?Consultants:  ?None ? ?Procedures:  ?None ? ?Antimicrobials: ?Vancomycin ?Cefepime ? ? ?Subjective: ?Seen and examined.  Wife at bedside.  Patient clinically appears improved this morning.  No specific complaints. ? ?Objective: ?Vitals:  ? 12/22/21 0903 12/22/21 2037 12/23/21 0439 12/23/21 0730  ?BP: (!) 93/53 122/67 98/69 (!) 116/52  ?Pulse: 93 77 92 89  ?Resp: 18 16 16 18   ?Temp: 98.2 ?F (36.8 ?C) 99.1 ?F (37.3 ?C) 98.2 ?F (36.8 ?C) 98.1 ?F (36.7 ?C)  ?TempSrc: Oral     ?SpO2: 95% 96% 96% 95%  ?Weight:      ?Height:      ? ? ?Intake/Output Summary (Last 24 hours) at 12/23/2021 1022 ?Last data filed at 12/23/2021 0900 ?Gross per 24 hour  ?Intake 2163.71 ml  ?Output 975 ml  ?Net 1188.71 ml  ? ?Filed Weights  ? 12/22/21 0158  ?Weight: 85.3 kg  ? ? ?Examination: ? ?General exam: NAD ?Respiratory system: Lungs clear.  Normal work of breathing.  Room air ?Cardiovascular system: S1-S2, regular rate, irregular rhythm, no murmurs ?Gastrointestinal system: Soft, NT/ND, normal bowel sounds ?Central nervous system: Alert and oriented. No focal neurological deficits. ?Extremities: Symmetric 5 x 5 power. ?Skin: No rashes, lesions or ulcers ?Psychiatry: Judgement and insight appear normal. Mood & affect appropriate.  ? ? ? ?  Data Reviewed: I have personally reviewed following labs and imaging studies ? ?CBC: ?Recent Labs  ?Lab 12/22/21 ?0206 12/23/21 ?1749   ?WBC 13.3* 10.3  ?NEUTROABS 11.0* 8.1*  ?HGB 13.1 12.3*  ?HCT 42.2 38.7*  ?MCV 98.4 95.6  ?PLT 83* 82*  ? ?Basic Metabolic Panel: ?Recent Labs  ?Lab 12/22/21 ?0206 12/23/21 ?4496 12/23/21 ?7591  ?NA 138  --  136  ?K 4.4  --  3.9  ?CL 106  --  110  ?CO2 24  --  21*  ?GLUCOSE 107*  --  112*  ?BUN 45*  --  36*  ?CREATININE 1.47* 1.39* 1.38*  ?CALCIUM 9.7  --  8.6*  ? ?GFR: ?Estimated Creatinine Clearance: 41.4 mL/min (A) (by C-G formula based on SCr of 1.38 mg/dL (H)). ?Liver Function Tests: ?Recent Labs  ?Lab 12/22/21 ?0206  ?AST 32  ?ALT 33  ?ALKPHOS 131*  ?BILITOT 2.1*  ?PROT 7.1  ?ALBUMIN 4.2  ? ?Recent Labs  ?Lab 12/22/21 ?0206  ?LIPASE 55*  ? ?No results for input(s): AMMONIA in the last 168 hours. ?Coagulation Profile: ?Recent Labs  ?Lab 12/22/21 ?0206 12/23/21 ?6384  ?INR 1.9* 2.2*  ? ?Cardiac Enzymes: ?No results for input(s): CKTOTAL, CKMB, CKMBINDEX, TROPONINI in the last 168 hours. ?BNP (last 3 results) ?No results for input(s): PROBNP in the last 8760 hours. ?HbA1C: ?No results for input(s): HGBA1C in the last 72 hours. ?CBG: ?No results for input(s): GLUCAP in the last 168 hours. ?Lipid Profile: ?No results for input(s): CHOL, HDL, LDLCALC, TRIG, CHOLHDL, LDLDIRECT in the last 72 hours. ?Thyroid Function Tests: ?Recent Labs  ?  12/22/21 ?0206  ?TSH 2.799  ?FREET4 1.15*  ? ?Anemia Panel: ?No results for input(s): VITAMINB12, FOLATE, FERRITIN, TIBC, IRON, RETICCTPCT in the last 72 hours. ?Sepsis Labs: ?Recent Labs  ?Lab 12/22/21 ?0311 12/22/21 ?1039 12/22/21 ?1310 12/23/21 ?0420  ?PROCALCITON  --  2.39  --  2.27  ?LATICACIDVEN 1.1 2.0* 2.3*  --   ? ? ?Recent Results (from the past 240 hour(s))  ?Culture, blood (routine x 2)     Status: None (Preliminary result)  ? Collection Time: 12/22/21  3:11 AM  ? Specimen: BLOOD  ?Result Value Ref Range Status  ? Specimen Description BLOOD RIGHT ANTECUBITAL  Final  ? Special Requests   Final  ?  BOTTLES DRAWN AEROBIC AND ANAEROBIC Blood Culture results may not be  optimal due to an inadequate volume of blood received in culture bottles  ? Culture   Final  ?  NO GROWTH 1 DAY ?Performed at [facility], [address] [place], [place] [address] ?  ? Report Status PENDING  Incomplete  ?Culture, blood (routine x 2)     Status: None (Preliminary result)  ? Collection Time: 12/22/21  3:11 AM  ? Specimen: BLOOD  ?Result Value Ref Range Status  ? Specimen Description BLOOD BLOOD RIGHT ARM  Final  ? Special Requests   Final  ?  BOTTLES DRAWN AEROBIC AND ANAEROBIC Blood Culture adequate volume  ? Culture   Final  ?  NO GROWTH 1 DAY ?Performed at [facility], [address] [place], [place] [address] ?  ? Report Status PENDING  Incomplete  ?Resp Panel by RT-PCR (Flu A&B, Covid) Nasopharyngeal Swab     Status: None  ? Collection Time: 12/22/21  3:11 AM  ? Specimen: Nasopharyngeal Swab; Nasopharyngeal(NP) swabs in vial transport medium  ?Result Value Ref Range Status  ? SARS Coronavirus 2 by RT PCR NEGATIVE NEGATIVE Final  ?  Comment: (NOTE) ?SARS-CoV-2 target nucleic acids are NOT DETECTED. ? ?The SARS-CoV-2 RNA is generally detectable in upper respiratory ?specimens during the acute phase of infection. The lowest ?concentration of SARS-CoV-2 viral copies this assay can detect is ?138 copies/mL. A negative result does not preclude SARS-Cov-2 ?infection and should not be used as the sole basis for treatment or ?other patient management decisions. A negative result may occur with  ?improper specimen collection/handling, submission of specimen other ?than nasopharyngeal swab, presence of viral mutation(s) within the ?areas targeted by this assay, and inadequate number of viral ?copies(<138 copies/mL). A negative result must be combined with ?clinical observations, patient history, and epidemiological ?information. The expected result is Negative. ? ?Fact Sheet for Patients:  ?EntrepreneurPulse.com.au ? ?Fact Sheet for Healthcare Providers:   ?IncredibleEmployment.be ? ?This test is no t yet approved or cleared by the [place] FDA and  ?has been authorized for detection and/or diagnosis of SARS-CoV-2 by ?FDA under an Emergency [place]

## 2021-12-23 NOTE — Plan of Care (Signed)
?  Problem: Education: ?Goal: Knowledge of General Education information will improve ?Description: Including pain rating scale, medication(s)/side effects and non-pharmacologic comfort measures ?Outcome: Progressing ?  ?Problem: Clinical Measurements: ?Goal: Ability to maintain clinical measurements within normal limits will improve ?Outcome: Progressing ?Goal: Cardiovascular complication will be avoided ?Outcome: Progressing ?  ?Problem: Activity: ?Goal: Risk for activity intolerance will decrease ?Outcome: Progressing ?  ?Problem: Nutrition: ?Goal: Adequate nutrition will be maintained ?Outcome: Progressing ?  ?Problem: Elimination: ?Goal: Will not experience complications related to bowel motility ?Outcome: Progressing ?  ?Problem: Pain Managment: ?Goal: General experience of comfort will improve ?Outcome: Progressing ?  ?Problem: Safety: ?Goal: Ability to remain free from injury will improve ?Outcome: Progressing ?  ?

## 2021-12-23 NOTE — TOC Initial Note (Signed)
Transition of Care (TOC) - Initial/Assessment Note  ? ? ?Patient Details  ?Name: Kyle Gutierrez ?MRN: [id number] ?Date of Birth: 1932-06-28 ? ?Transition of Care (TOC) CM/SW Contact:    ?Adelene Amas, LCSW ?Phone Number: [phone] ?12/23/2021, 12:22 PM ? ?Clinical Narrative:                 ? ?Patient presents to [facility] due to shaking and warmth in his chest.  Patient lives at [facility] independent living, main contact is spouse Kim, Lauver (Spouse)  ?[phone] [facility] Phone). Patient is independent with ADLs.  Planned d/c for 24-48 hours. Patient does not have current TOC needs. Please place [facility] consult if new discharge needs are identified. ? ?Expected Discharge Plan: Home/Self Care ?Barriers to Discharge: Homeless with medical needs ? ? ?Patient Goals and CMS Choice ?  ?  ?  ? ?Expected Discharge Plan and Services ?Expected Discharge Plan: Home/Self Care ?In-house Referral: Clinical Social Work ?  ?  ?Living arrangements for the past 2 months: [facility]) ?                ?  ?  ?  ?  ?  ?  ?  ?  ?  ?  ? ?Prior Living Arrangements/Services ?Living arrangements for the past 2 months: [facility]) ?Lives with:: Spouse ?Patient language and need for interpreter reviewed:: No ?Do you feel safe going back to the place where you live?: Yes      ?Need for Family Participation in Patient Care: Yes (Comment) ?Care giver support system in place?: Yes (comment) ?  ?Criminal Activity/Legal Involvement Pertinent to Current Situation/Hospitalization: No - Comment as needed ? ?Activities of Daily Living ?Home Assistive Devices/Equipment: BIPAP, Other (Comment) (rollator) ?ADL Screening (condition at time of admission) ?Patient's cognitive ability adequate to safely complete daily activities?: Yes ?Is the patient deaf or have difficulty hearing?: Yes ?Does the patient have difficulty seeing, even when wearing glasses/contacts?: No ?Does the patient have  difficulty concentrating, remembering, or making decisions?: No ?Patient able to express need for assistance with ADLs?: Yes ?Does the patient have difficulty dressing or bathing?: No ?Independently performs ADLs?: Yes (appropriate for developmental age) ?Does the patient have difficulty walking or climbing stairs?: Yes ?Weakness of Legs: Both ?Weakness of Arms/Hands: None ? ?Permission Sought/Granted ?Permission sought to share information with : Family Supports ?Permission granted to share information with : Yes, Verbal Permission Granted ? Share Information with NAME: Salbador, Fiveash (Spouse)   [phone] [facility] Phone) ?   ?   ?   ? ?Emotional Assessment ?Appearance:: Appears stated age ?Attitude/Demeanor/Rapport: Engaged ?Affect (typically observed): Stable ?Orientation: : Oriented to Situation, Oriented to  Time, Oriented to Place, Oriented to Self ?Alcohol / Substance Use: Not Applicable ?Psych Involvement: No (comment) ? ?Admission diagnosis:  Thrombocytopenia ([place]) [D69.6] ?Chills (without fever) [R68.83] ?AKI (acute kidney injury) ([place]) [N17.9] ?Fever, unspecified fever cause [R50.9] ?Patient Active Problem List  ? Diagnosis Date Noted  ? SIRS (systemic inflammatory response syndrome) ([place]) 12/22/2021  ? Acute kidney injury superimposed on chronic kidney disease ([place]) 12/22/2021  ? Dyslipidemia 12/22/2021  ? Atrial fibrillation, chronic ([place]) 12/22/2021  ? Stage 3a chronic kidney disease ([place]) 07/13/2019  ? Full thickness rotator cuff tear 01/11/2019  ? Disorder of bursae of shoulder region 01/11/2019  ? Total knee replacement status 07/04/2018  ? S/P left rotator cuff repair 07/02/2018  ? Localized, primary osteoarthritis of shoulder region 03/24/2018  ? Venous stasis 07/07/2017  ? Primary localized  osteoarthritis of right hip 06/19/2017  ? B12 deficiency 01/22/2017  ? Urethral stricture 07/30/2016  ? Moderate tricuspid insufficiency 03/26/2016  ? Bilateral carotid artery stenosis 08/10/2014  ?  Moderate mitral valve regurgitation 08/10/2014  ? Bilateral hearing loss 07/05/2014  ? Edema 05/31/2014  ? Pain in limb 05/10/2014  ? S/P rotator cuff surgery 05/03/2014  ? Dizziness and giddiness 05/02/2014  ? Pure hypercholesterolemia 02/04/2014  ? Cough 08/15/2013  ? History of tobacco abuse 08/13/2013  ? OSA (obstructive sleep apnea) 06/01/2013  ? Pyelonephritis 05/20/2013  ? Myoclonic jerking while sleeping 05/20/2013  ? Routine general medical examination at a health care facility 04/16/2013  ? Long term (current) use of anticoagulants 08/11/2012  ? Hematuria, gross 04/11/2012  ? Chronic atrial fibrillation with RVR (HCC)   ? Coronary artery disease, non-occlusive   ? Carotid artery plaque   ? Valvular cardiomyopathy ([place])   ? renal cell carcinoma   ? Benign prostatic hyperplasia   ? Screening for colon cancer 02/14/2012  ? Retinopathy   ? Pulmonary nodule/lesion, solitary 08/16/2011  ? Substance abuse ([place])   ? Thrombocytopenia, unspecified ([place])   ? Neuropathy, alcoholic ([place])   ? Peripheral vascular disease ([place]) 08/06/2011  ? LUMBAR RADICULOPATHY, LEFT 05/02/2009  ? PERIPHERAL NEUROPATHY 01/05/2009  ? HYPERTENSION 12/05/2008  ? ATRIAL FIBRILLATION 12/05/2008  ? SKIN CANCER, HX OF 12/05/2008  ? COLONIC POLYPS, HX OF 12/05/2008  ? DIVERTICULITIS, HX OF 12/05/2008  ? ?PCP:  Dion Body, MD ?Pharmacy:   ?[id number] [address], [place] [address] ?Phone: [phone] Fax: [phone] ? ? ? ? ?Social Determinants of Health (SDOH) Interventions ?  ? ?Readmission Risk Interventions ?   ? View : No data to display.  ?  ?  ?  ? ? ? ?

## 2021-12-23 NOTE — Progress Notes (Signed)
Patient states machine is very uncomfortable, does not want to wear hospital unit. Unit has been removed from room. ?

## 2021-12-23 NOTE — Consult Note (Signed)
?CARDIOLOGY CONSULT NOTE  ? ?   ?    ?  ? ? ? ?Patient ID: ?Kyle Gutierrez ?MRN: [id number] ?DOB/AGE: 02/28/1932 88 y.o. ? ?Admit date: 12/22/2021 ?Referring Physician Dr. Eugenie Norrie hospitalist ?Primary Physician Dr. Netty Starring primary ?Primary Cardiologist Dr. Nehemiah Massed ?Reason for Consultation bradycardia atrial fibrillation ? ?HPI: Patient 88 year old multiple medical problems came to emergency room with fever chills shaking patient was fine yesterday and went to bed and then woke up with shaking chills dyspneic no cough or wheezing denies any pain no nausea vomiting or diarrhea temperature was slightly elevated over 100 EKG still showed persistent atrial fibrillation chest x-ray was clear patient was treated with broad-spectrum antibiotic therapy for possible sepsis and continued evaluation for possible infection otherwise patient is doing reasonably well ? ?Review of systems complete and found to be negative unless listed above  ? ? ? ?Past Medical History:  ?Diagnosis Date  ? Benign prostatic hypertrophy   ? s/p TURP  ? Cardiomyopathy ([place])   ? Carotid artery plaque   ? bilateral  ? CHF (congestive heart failure) ([place])   ? Chronic atrial fibrillation (HCC)   ? Complication of anesthesia   ? balance problem  ? Coronary artery disease, non-occlusive   ? Dyspnea   ? Dysrhythmia   ? atrial fib  ? Elevated lipids   ? Gross hematuria   ? Hypertension   ? Neuropathy, alcoholic ([place])   ? per Neurologic eval, remote  ? Nocturia   ? Poor balance   ? renal cell carcinoma 2002  ? left kidney cancer with partial nephrectomy.  ? Retinopathy   ? followed by [facility]  ? Sleep apnea   ? Thrombocytopenia, unspecified ([place])   ? Urethral stricture   ? Urinary frequency   ? 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 severe mitral and tricuspid insufficiency, ECHO July 2012  ?  ?Past Surgical History:  ?Procedure Laterality Date  ? CYSTOSCOPY W/ RETROGRADES N/A 05/06/2013  ? Procedure: CYSTOSCOPY WITH RETROGRADE PYELOGRAM,  BALLOON DILATION  OF URETHRAL STRICTURE;  Surgeon: Dutch Gray, MD;  Location: [facility];  Service: Urology;  Laterality: N/A;  ? EYE SURGERY    ? bilateral cataracts removed  ? left rotator cuff repair    ? PARTIAL NEPHRECTOMY Left   ? 10'02  ? PROSTATE SURGERY    ? ROTATOR CUFF REPAIR Right   ? SHOULDER ARTHROSCOPY WITH OPEN ROTATOR CUFF REPAIR Left 07/02/2018  ? Procedure: SHOULDER ARTHROSCOPY WITH MINI OPEN ROTATOR CUFF REPAIR INCLUDING SUBSCAPULARIS ,SUBACROMINAL DECOMPRESSION.;  Surgeon: Thornton Park, MD;  Location: [facility] ORS;  Service: Orthopedics;  Laterality: Left;  ? TOTAL HIP ARTHROPLASTY Right 06/19/2017  ? Procedure: TOTAL HIP ARTHROPLASTY ANTERIOR APPROACH;  Surgeon: Hessie Knows, MD;  Location: [facility] ORS;  Service: Orthopedics;  Laterality: Right;  ? TRANSURETHRAL RESECTION OF PROSTATE  2003  ?  ?Medications Prior to Admission  ?Medication Sig Dispense Refill Last Dose  ? acetaminophen (TYLENOL) 500 MG tablet Take 1,000 mg by mouth 2 (two) times daily.      ? furosemide (LASIX) 40 MG tablet Take 40 mg by mouth daily.     ? losartan-hydrochlorothiazide (HYZAAR) 100-12.5 MG per tablet Take 1 tablet by mouth daily.      ? metoprolol succinate (TOPROL-XL) 25 MG 24 hr tablet Take 12.5 mg by mouth daily.      ? Multiple Vitamins-Minerals (PRESERVISION AREDS 2 PO) Take 1 capsule by mouth 2 (two) times daily.     ? simvastatin (ZOCOR) 40 MG tablet  Take 1 tablet (40 mg total) by mouth every morning. 90 tablet 2   ? spironolactone (ALDACTONE) 25 MG tablet Take 1 tablet by mouth daily.     ? warfarin (COUMADIN) 2 MG tablet Take 2 mg by mouth 3 (three) times a week.     ? ?Social History  ? ?Socioeconomic History  ? Marital status: Married  ?  Spouse name: Not on file  ? Number of children: Not on file  ? Years of education: Not on file  ? Highest education level: Not on file  ?Occupational History  ? Not on file  ?Tobacco Use  ? Smoking status: Former  ?  Types: Cigarettes  ?  Quit date: 03/29/1977  ?  Years since quitting: 44.7  ?  Smokeless tobacco: Never  ?Vaping Use  ? Vaping Use: Never used  ?Substance and Sexual Activity  ? Alcohol use: Yes  ?  Comment: daily 5-6 ounces wine or whiskey   ? Drug use: No  ? Sexual activity: Yes  ?Other Topics Concern  ? Not on file  ?Social History Narrative  ? Not on file  ? ?Social Determinants of Health  ? ?Financial Resource Strain: Not on file  ?Food Insecurity: Not on file  ?Transportation Needs: Not on file  ?Physical Activity: Not on file  ?Stress: Not on file  ?Social Connections: Not on file  ?Intimate Partner Violence: Not on file  ?  ?Family History  ?Problem Relation Age of Onset  ? Heart disease Mother 74  ?     heart failure  ? Early death Father 20  ? Heart disease Father   ?     CAD  ? Cancer Sister   ?     Breast  ?  ? ? ?Review of systems complete and found to be negative unless listed above  ? ? ? ? ?PHYSICAL EXAM ? ?General: Well developed, well nourished, in no acute distress ?HEENT:  Normocephalic and atramatic ?Neck:  No JVD.  ?Lungs: Clear bilaterally to auscultation and percussion. ?Heart: Irregular. Normal S1 and S2 without gallops or murmurs.  ?Abdomen: Bowel sounds are positive, abdomen soft and non-tender  ?Msk:  Back normal, normal gait. Normal strength and tone for age. ?Extremities: No clubbing, cyanosis or edema.   ?Neuro: Alert and oriented X 3. ?Psych:  Good affect, responds appropriately ? ?Labs: ?  ?Lab Results  ?Component Value Date  ? WBC 10.3 12/23/2021  ? HGB 12.3 (L) 12/23/2021  ? HCT 38.7 (L) 12/23/2021  ? MCV 95.6 12/23/2021  ? PLT 82 (L) 12/23/2021  ?  ?Recent Labs  ?Lab 12/22/21 ?0206 12/23/21 ?6160 12/23/21 ?7371  ?NA 138  --  136  ?K 4.4  --  3.9  ?CL 106  --  110  ?CO2 24  --  21*  ?BUN 45*  --  36*  ?CREATININE 1.47*   < > 1.38*  ?CALCIUM 9.7  --  8.6*  ?PROT 7.1  --   --   ?BILITOT 2.1*  --   --   ?ALKPHOS 131*  --   --   ?ALT 33  --   --   ?AST 32  --   --   ?GLUCOSE 107*  --  112*  ? < > = values in this interval not displayed.  ? ?Lab Results   ?Component Value Date  ? TROPONINI < 0.02 03/19/2014  ?  ?Lab Results  ?Component Value Date  ? CHOL 134 05/02/2014  ? CHOL 136  04/16/2013  ? CHOL 139 02/13/2012  ? ?Lab Results  ?Component Value Date  ? HDL 50.20 05/02/2014  ? HDL 54.50 04/16/2013  ? HDL 54 02/13/2012  ? ?Lab Results  ?Component Value Date  ? [place] 68 05/02/2014  ? [place] 70 04/16/2013  ? [place] 75 02/13/2012  ? ?Lab Results  ?Component Value Date  ? TRIG 78.0 05/02/2014  ? TRIG 58.0 04/16/2013  ? TRIG 52 02/13/2012  ? ?Lab Results  ?Component Value Date  ? CHOLHDL 3 05/02/2014  ? CHOLHDL 2 04/16/2013  ? CHOLHDL 3 08/06/2011  ? ?No results found for: LDLDIRECT  ?  ?Radiology: CT ABDOMEN PELVIS WO CONTRAST ? ?Result Date: 12/22/2021 ?CLINICAL DATA:  Possible sepsis, fever, chills EXAM: CT ABDOMEN AND PELVIS WITHOUT CONTRAST TECHNIQUE: Multidetector CT imaging of the abdomen and pelvis was performed following the standard protocol without IV contrast. RADIATION DOSE REDUCTION: This exam was performed according to the departmental dose-optimization program which includes automated exposure control, adjustment of the mA and/or kV according to patient size and/or use of iterative reconstruction technique. COMPARISON:  10/02/2020 FINDINGS: Lower chest: Heart is enlarged in size. Calcifications are seen in the mitral annulus. Breathing motion artifacts limit evaluation of lower lung fields. Small linear densities seen in the cardiophrenic angles suggesting minimal scarring or subsegmental atelectasis. Hepatobiliary: There are few low-density foci in the left lobe of liver each measuring less than 11 mm. Similar finding was seen in the previous examination. There is 3.3 cm low-density structure in the posterior right lobe with no significant interval change. There is no dilation of bile ducts. Gallbladder is unremarkable. Pancreas: There is stranding in the peripancreatic fat. No focal abnormality is seen in the pancreas. Spleen: Spleen measures 13 cm  in maximum diameter. Adrenals/Urinary Tract: Adrenals are unremarkable. There is no hydronephrosis. There is exophytic lesion with coarse calcification in the lower pole of left kidney with no significant cha

## 2021-12-23 NOTE — Progress Notes (Signed)
CCMD called several times throughout night reporting afib with nonsustained HR of 28-39 and 2.49-2.7 sec pauses. Patient sleeping when assessed and asymptomatic when aroused. NP Randol Kern notified and cardiac consult put in.  ?

## 2021-12-23 NOTE — Consult Note (Addendum)
Pharmacy Antibiotic Note ? ?Kyle Gutierrez is a 88 y.o. male admitted on 12/22/2021 with sepsis.  Pharmacy has been consulted for Cefepime and Vancomycin dosing. Unknown source.  ? ?Plan: ?Continue cefepime 2 g BID.  ? ?Continue flaygl 500 mg BID ? ?Pt received vancomycin 2000 mg x 1 loading dose on 4/22. Will order vancomycin 1000 mg q24H. Pt is at baseline Scr (1.3-1.5). Predicted AUC 420 (Scr 1.38, Vd 0.72) Goal AUC 400-550. Plan to obtain vancomycin level after 4th or 5th dose.  ? ? ?Height: 6' (182.9 cm) ?Weight: 85.3 kg (188 lb) ?IBW/kg (Calculated) : 77.6 ? ?Temp (24hrs), Avg:98.5 ?F (36.9 ?C), Min:98.1 ?F (36.7 ?C), Max:99.1 ?F (37.3 ?C) ? ?Recent Labs  ?Lab 12/22/21 ?0206 12/22/21 ?0311 12/22/21 ?1039 12/22/21 ?1310 12/23/21 ?0420 12/23/21 ?4097  ?WBC 13.3*  --   --   --   --  10.3  ?CREATININE 1.47*  --   --   --  1.39* 1.38*  ?LATICACIDVEN  --  1.1 2.0* 2.3*  --   --   ?VANCORANDOM  --   --   --   --  12  --   ? ?  ?Estimated Creatinine Clearance: 41.4 mL/min (A) (by C-G formula based on SCr of 1.38 mg/dL (H)).   ? ?Allergies  ?Allergen Reactions  ? Ace Inhibitors Cough  ?  REACTION: cough  ? Latex Rash  ? Tape Rash and Other (See Comments)  ?  OK to use paper tape ?OK to use paper tape  ? Tapentadol Rash  ? ? ?Antimicrobials this admission: ?Zosyn x1 in ED >> ?Cefepime 2gm IV q12h >> ?Metronidazole 500mg  IV q12h >> ?Vancomycin >> ? ?Dose adjustments this admission: ? ? ?Microbiology results: ?4/22 BCx: NGTD ?4/22 UCx: sent  ?4/22 MRSA PCR: ordered ? ? ?Thank you for allowing pharmacy to be a part of this pleasant patient?s care. ? ?Oswald Hillock, PharmD ?Clinical Pharmacist ?12/23/2021 ?9:55 AM ? ? ?

## 2021-12-23 NOTE — Progress Notes (Signed)
Cross Cover ?Patient with controlled atrial fib experiencing asymptomatic non sustained bradycardic periods. Patietn is followed by [facility] cardiology services. Message sent to Dr. Clayborn Bigness to request evaluation ?

## 2021-12-23 NOTE — Plan of Care (Signed)
?  Problem: Education: ?Goal: Knowledge of General Education information will improve ?Description: Including pain rating scale, medication(s)/side effects and non-pharmacologic comfort measures ?12/23/2021 2249 by Lydia Guiles, RN ?Outcome: Progressing ?12/23/2021 2248 by Lydia Guiles, RN ?Outcome: Progressing ?  ?Problem: Nutrition: ?Goal: Adequate nutrition will be maintained ?12/23/2021 2249 by Lydia Guiles, RN ?Outcome: Progressing ?12/23/2021 2248 by Lydia Guiles, RN ?Outcome: Progressing ?  ?Problem: Pain Managment: ?Goal: General experience of comfort will improve ?12/23/2021 2249 by Lydia Guiles, RN ?Outcome: Progressing ?12/23/2021 2248 by Lydia Guiles, RN ?Outcome: Progressing ?  ?Problem: Safety: ?Goal: Ability to remain free from injury will improve ?12/23/2021 2249 by Lydia Guiles, RN ?Outcome: Progressing ?12/23/2021 2248 by Lydia Guiles, RN ?Outcome: Progressing ?  ?

## 2021-12-24 DIAGNOSIS — R6510 Systemic inflammatory response syndrome (SIRS) of non-infectious origin without acute organ dysfunction: Secondary | ICD-10-CM | POA: Diagnosis not present

## 2021-12-24 LAB — PROTIME-INR
INR: 2 — ABNORMAL HIGH (ref 0.8–1.2)
Prothrombin Time: 22.1 seconds — ABNORMAL HIGH (ref 11.4–15.2)

## 2021-12-24 MED ORDER — WARFARIN SODIUM 3 MG PO TABS: 3.0000 mg | ORAL_TABLET | Freq: Once | ORAL | Status: DC

## 2021-12-24 MED ORDER — BLISTEX MEDICATED EX OINT
TOPICAL_OINTMENT | CUTANEOUS | Status: DC | PRN
  Administered 2021-12-24: 1 via TOPICAL
  Filled 2021-12-24: qty 6.3

## 2021-12-24 MED ORDER — AMOXICILLIN-POT CLAVULANATE 875-125 MG PO TABS: 1.0000 | ORAL_TABLET | Freq: Two times a day (BID) | ORAL | 0 refills | Status: AC

## 2021-12-24 MED ORDER — AMOXICILLIN-POT CLAVULANATE 875-125 MG PO TABS: 1.0000 | ORAL_TABLET | Freq: Two times a day (BID) | ORAL | Status: DC

## 2021-12-24 MED ORDER — AMOXICILLIN-POT CLAVULANATE 875-125 MG PO TABS
1.0000 | ORAL_TABLET | Freq: Two times a day (BID) | ORAL | Status: DC
  Administered 2021-12-24: 1 via ORAL
  Filled 2021-12-24 (×2): qty 1

## 2021-12-24 NOTE — Consult Note (Signed)
ANTICOAGULATION CONSULT NOTE ? ?Pharmacy Consult for Warfarin ?Indication: atrial fibrillation ? ?Patient Measurements: ?Height: 6' (182.9 cm) ?Weight: 85.3 kg (188 lb) ?IBW/kg (Calculated) : 77.6 ? ?Labs: ?Recent Labs  ?  12/22/21 ?0206 12/22/21 ?6283 12/23/21 ?0420 12/23/21 ?1517 12/24/21 ?0420  ?HGB 13.1  --   --  12.3*  --   ?HCT 42.2  --   --  38.7*  --   ?PLT 83*  --   --  82*  --   ?LABPROT 21.6*  --  24.5*  --  22.1*  ?INR 1.9*  --  2.2*  --  2.0*  ?CREATININE 1.47*  --  1.39* 1.38*  --   ?TROPONINIHS 9 10  --   --   --   ? ? ? ?Estimated Creatinine Clearance: 41.4 mL/min (A) (by C-G formula based on SCr of 1.38 mg/dL (H)). ? ? ?Medical History: ?Past Medical History:  ?Diagnosis Date  ? Benign prostatic hypertrophy   ? s/p TURP  ? Cardiomyopathy ([place])   ? Carotid artery plaque   ? bilateral  ? CHF (congestive heart failure) ([place])   ? Chronic atrial fibrillation (HCC)   ? Complication of anesthesia   ? balance problem  ? Coronary artery disease, non-occlusive   ? Dyspnea   ? Dysrhythmia   ? atrial fib  ? Elevated lipids   ? Gross hematuria   ? Hypertension   ? Neuropathy, alcoholic ([place])   ? per Neurologic eval, remote  ? Nocturia   ? Poor balance   ? renal cell carcinoma 2002  ? left kidney cancer with partial nephrectomy.  ? Retinopathy   ? followed by [facility]  ? Sleep apnea   ? Thrombocytopenia, unspecified ([place])   ? Urethral stricture   ? Urinary frequency   ? Valvular cardiomyopathy ([place])   ? severe mitral and tricuspid insufficiency, ECHO July 2012  ? ? ?Medications:  ?Warfarin 2 mg TuThSa and 3 mg SuMoWeFr ? ?Assessment: ?Patient is an 88 y/o M with medical history as above and including Afib on warfarin and alcohol use disorder who is admitted with SIRS / sepsis rule out. Pharmacy consulted to manage warfarin while patient is admitted. ? ?DDI: Cefepime, metronidazole (discontinued 4/23) ? ?CBC notable for stable thrombocytopenia ? ?Date INR Plan  ?4/22 1.9 Held  ?4/23 2.2 2 mg  ?4/24 2 3  mg  ? ?Goal  of Therapy:  ?INR 2-3 ? ?Plan:  ?--Patient missed one day of therapy which may be confounding effect of drug-drug interactions. Metronidazole discontinued 4/23 ?--Warfarin 3 mg tonight, consistent with home regimen ?--Daily INR per protocol ?--CBC at least every 3 days per protocol ? ?Benita Gutter ?12/24/2021,7:40 AM ? ? ?

## 2021-12-24 NOTE — Discharge Summary (Signed)
Physician Discharge Summary  ?Kyle Gutierrez BSW:[id number] DOB: May 12, 1932 DOA: 12/22/2021 ? ?PCP: Kyle Body, MD ? ?Admit date: 12/22/2021 ?Discharge date: 12/24/2021 ? ?Admitted From: ALF ?Disposition: ALF [facility] [place] ? ?Recommendations for Outpatient Follow-up:  ?Follow up with PCP in 1-2 weeks ? ? ?Home Health: No ?Equipment/Devices: None ? ?Discharge Condition: Stable ?CODE STATUS: Full ?Diet recommendation: Regular ? ?Brief/Interim Summary: ?88 year old Caucasian male with history significant for congestive heart failure, chronic atrial fibrillation, coronary artery disease, history of renal cell cancer with partial nephrectomy, chronic thrombocytopenia, valvulopathy who presents to the ED with acute onset of chills with subjective fever.  Symptoms started on the day prior to admission.  Patient lives in assisted living.  Called EMS to bedside who recommended patient present to the ER.  On presentation to the ER patient does have a low-grade temperature.  He has some acute kidney injury.  Mild leukocytosis.  Admitted with working diagnosis of sepsis of unclear source.  ?  ?Patient clinically improving over interval.  No fevers or chills noted.  Feels better overall.  Stool study negative for infectious diarrhea.  No clear source of infection.  Evidence of mild peripancreatic fat stranding on CT of unclear etiology.  Clinically consistent with acute pancreatitis.  Procalcitonin elevated but downtrending. ? ?Patient back to baseline at time of discharge.  Seen by cardiology in consultation.  No specific changes in medications recommended.  Bradycardia asymptomatic and transient.  Unclear septic focus.  Suspect element of viral gastroenteritis considering recent diarrhea.  Diarrhea has stopped.  GI PCR and C. difficile negative.  Considering elevated procalcitonin and suspicion for a bacterial sepsis on presentation we will treat empirically with course of Augmentin.  875/125 for total 7-day antibiotic  course.  Discharged home in stable condition. ? ? ? ?Discharge Diagnoses:  ?Principal Problem: ?  SIRS (systemic inflammatory response syndrome) (HCC) ?Active Problems: ?  Acute kidney injury superimposed on chronic kidney disease ([place]) ?  Atrial fibrillation, chronic ([place]) ?  Dyslipidemia ?Suspected sepsis of unclear source ?Suspect infectious source but unclear ?Sepsis physiology manifested by fever and tachycardia ?CT abdomen pelvis negative for infectious focus ?Urinalysis CXR unremarkable ?Unable to completely rule out sepsis ?Procalcitonin elevated but downtrending ?Plan: ?DC IV antibiotics ?Start Augmentin 875/125 ?Complete additional 5 days for total 7-day antibiotic course ?Follow-up outpatient PCP ? ?AKI on CKD stage IIIa ?Improving over interval ?Making good urine ?Outpatient PCP/nephrology follow-up ?  ?Chronic atrial fibrillation ?Bradycardia with pauses ?Cardiology engaged ?Patient asymptomatic ?Plan: ?Continue Toprol-XL with holding parameters ?Resume warfarin ?Recommend follow-up with cardiology for discussion regarding transition to [place] ?  ?Hyperlipidemia ?PTA statin ?  ? ? ?Discharge Instructions ? ?Discharge Instructions   ? ? Diet - low sodium heart healthy   Complete by: As directed ?  ? Increase activity slowly   Complete by: As directed ?  ? No wound care   Complete by: As directed ?  ? ?  ? ?Allergies as of 12/24/2021   ? ?   Reactions  ? Ace Inhibitors Cough  ? REACTION: cough  ? Latex Rash  ? Tape Rash, Other (See Comments)  ? OK to use paper tape ?OK to use paper tape  ? Tapentadol Rash  ? ?  ? ?  ?Medication List  ?  ? ?TAKE these medications   ? ?acetaminophen 500 MG tablet ?Commonly known as: TYLENOL ?Take 1,000 mg by mouth 2 (two) times daily. ?  ?amoxicillin-clavulanate 875-125 MG tablet ?Commonly known as: AUGMENTIN ?Take 1 tablet by mouth every  12 (twelve) hours for 5 days. ?  ?furosemide 40 MG tablet ?Commonly known as: LASIX ?Take 40 mg by mouth daily. ?   ?losartan-hydrochlorothiazide 100-12.5 MG tablet ?Commonly known as: HYZAAR ?Take 1 tablet by mouth daily. ?  ?metoprolol succinate 25 MG 24 hr tablet ?Commonly known as: TOPROL-XL ?Take 12.5 mg by mouth daily. ?  ?PRESERVISION AREDS 2 PO ?Take 1 capsule by mouth 2 (two) times daily. ?  ?simvastatin 40 MG tablet ?Commonly known as: ZOCOR ?Take 1 tablet (40 mg total) by mouth every morning. ?  ?spironolactone 25 MG tablet ?Commonly known as: ALDACTONE ?Take 1 tablet by mouth daily. ?  ?warfarin 2 MG tablet ?Commonly known as: COUMADIN ?Take 2 mg by mouth as directed. Take 3 mg (1 and 1/2 tablets) Sunday, Monday, Wednesday, and Friday and 2 mg (1 tablet) on Tuesday, Thursday, and Saturday ?  ? ?  ? ? ?Allergies  ?Allergen Reactions  ? Ace Inhibitors Cough  ?  REACTION: cough  ? Latex Rash  ? Tape Rash and Other (See Comments)  ?  OK to use paper tape ?OK to use paper tape  ? Tapentadol Rash  ? ? ?Consultations: ?Cardiology ? ? ?Procedures/Studies: ?CT ABDOMEN PELVIS WO CONTRAST ? ?Result Date: 12/22/2021 ?CLINICAL DATA:  Possible sepsis, fever, chills EXAM: CT ABDOMEN AND PELVIS WITHOUT CONTRAST TECHNIQUE: Multidetector CT imaging of the abdomen and pelvis was performed following the standard protocol without IV contrast. RADIATION DOSE REDUCTION: This exam was performed according to the departmental dose-optimization program which includes automated exposure control, adjustment of the mA and/or kV according to patient size and/or use of iterative reconstruction technique. COMPARISON:  10/02/2020 FINDINGS: Lower chest: Heart is enlarged in size. Calcifications are seen in the mitral annulus. Breathing motion artifacts limit evaluation of lower lung fields. Small linear densities seen in the cardiophrenic angles suggesting minimal scarring or subsegmental atelectasis. Hepatobiliary: There are few low-density foci in the left lobe of liver each measuring less than 11 mm. Similar finding was seen in the previous  examination. There is 3.3 cm low-density structure in the posterior right lobe with no significant interval change. There is no dilation of bile ducts. Gallbladder is unremarkable. Pancreas: There is stranding in the peripancreatic fat. No focal abnormality is seen in the pancreas. Spleen: Spleen measures 13 cm in maximum diameter. Adrenals/Urinary Tract: Adrenals are unremarkable. There is no hydronephrosis. There is exophytic lesion with coarse calcification in the lower pole of left kidney with no significant change. There are no renal or ureteral stones. Urinary bladder is not distended. Stomach/Bowel: Stomach is unremarkable. Small bowel loops are not dilated. Appendix is not seen. There is no pericecal inflammation. There is no significant wall thickening in colon. Multiple diverticula are seen in the colon without signs of focal diverticulitis. Vascular/Lymphatic: Arterial calcifications are seen in the aorta and its major branches. Reproductive: Coarse calcifications are seen in the prostate. Other: There is no pneumoperitoneum. Small ascites is present in the abdomen and pelvis. Small bilateral inguinal hernias containing fat are noted. Musculoskeletal: There is previous right hip arthroplasty. Degenerative changes are noted in the lumbar spine with disc space narrowing, bony spurs and encroachment of neural foramina at multiple levels. IMPRESSION: There is stranding in the peripancreatic fat. This may suggest acute pancreatitis. Please correlate with laboratory findings. There is no evidence of intestinal obstruction or pneumoperitoneum. There is no hydronephrosis. Cardiomegaly. Small ascites. Diverticulosis of colon without signs of focal diverticulitis. Lumbar spondylosis. Few low-density lesions in the liver have not  changed significantly. Other findings as described in the Gutierrez of the report. Electronically Signed   By: Elmer Picker M.D.   On: 12/22/2021 15:13  ? ?DG Chest Port 1 View ? ?Result  Date: 12/22/2021 ?CLINICAL DATA:  Shaking EXAM: PORTABLE CHEST 1 VIEW COMPARISON:  07/02/2018 FINDINGS: Cardiomegaly. No confluent opacities, effusions or edema. No acute bony abnormality. IMPRESSION: Cardiomegaly.  No active disease. El

## 2021-12-27 LAB — CULTURE, BLOOD (ROUTINE X 2)
Culture: NO GROWTH
Culture: NO GROWTH
Special Requests: ADEQUATE

## 2021-12-31 ENCOUNTER — Encounter: Payer: Self-pay | Admitting: Urology

## 2021-12-31 ENCOUNTER — Ambulatory Visit (INDEPENDENT_AMBULATORY_CARE_PROVIDER_SITE_OTHER): Payer: Medicare Other | Admitting: Urology

## 2021-12-31 VITALS — BP 138/71 | HR 92 | Ht 72.0 in | Wt 213.0 lb

## 2021-12-31 DIAGNOSIS — R350 Frequency of micturition: Secondary | ICD-10-CM | POA: Diagnosis not present

## 2021-12-31 DIAGNOSIS — N2889 Other specified disorders of kidney and ureter: Secondary | ICD-10-CM

## 2021-12-31 NOTE — Progress Notes (Signed)
? ?12/31/2021 ?11:00 AM  ? ?Kyle Gutierrez ?01-May-1932 ?[id number] ? ?Referring provider: Dion Body, MD ?[place] ?[employer] ?[place],  [place] [address] ? ?No chief complaint on file. ? ? ?HPI: ?Patient last seen 5 years ago.  He has been dilated for stricture disease.  He has had a left partial nephrectomy in 2002 for renal cell carcinoma.  He has had a transurethral resection of the prostate and his residual 5 years ago was 37 mL. ?  ?Primary care referred the patient because he reported to them that once or twice a year for less than 24 hours she will have blood in the urine.  He does take Coumadin for last 25 years ?  ?At baseline he voids every 2 hours and gets up once or twice a night.  Flow is reasonable.  He generally feels empty.  Intermittently he may have mild strain.  He has a smoking history ?  ?Patient has a history of gross hematuria.  I will order a CT scan.  He has a distant history of renal cell carcinoma.  I will have him come back for cystoscopy.  If he has a stricture and he ever needed dilation I would asked my partner to do this just in case he had a bladder cancer that need to be treated simultaneously under anesthesia. ?  ?I think he went through a lot in 2016 when he had a stricture dilated under anesthesia and he said he does not want to go through that again ?  ?The patient had balloon dilation of urethral stricture looking for causes of hematuria in 2014 by Dr. Alinda Money in [place].  I was dilated again here in 2016. ?Frequency stable no more blood in urine.  The procedure in 2016 was done at the bedside.  He said he does not want cystoscopy and he said he would never have another anesthetic at his age just to clear his bladder.  We talked about differential diagnosis and I think he made a very good decision   ?  ?The patient as needed.  At age 88 he does not want further evaluation ?  ?In September 2022 patient saw a Designer, jewellery.  He is on  Coumadin.  He had a 9-day history of blood in the urine.  He passed a clot the day prior.  He had a negative culture 3 days prior at [facility] and was given ciprofloxacin.  He declined a repeat urinalysis on that day and his residual was 27 mL.  He was given Flomax.  He had a positive culture July 22, 2020.  A hematuria CT scan demonstrated normal kidneys and a large median lobe of the prostate ?  ?Cystoscopy: Patient underwent flexible cystoscopy.  The penile and majority of the bulbar urethra normal.  He had a mild stricture in the proximal bulbar urethra approximately 2 cm distal to the sphincter.  Is approximately 16 [place] was only a web but I could not negotiate through it. ?  ?Patient has not had any more bleeding for 2 weeks.  The patient has a choice of going to sleep for a dilation and bladder examination.  If he had cancer he would be resected or biopsied.  We talked about the differential diagnosis.  If the scope was firmer today I would have been able to negotiate through it likely.  Based upon his age and comorbidities and general health certainly watchful waiting is a reasonable choice. ? ?We spoke about the patient's options.  He would need medical clearance.  This would be done by one of my partners in case he needs resection of a bladder tumor.  Which are was drawn.  Patient shows watchful waiting.  He has bled off and on for 15 years with the similar situation.  The only difference is that it lasted longer at this time were going to avoid the risk of an anesthetic.  See him as needed and see him in 1 year   ? ?Today ?I last saw the patient in September 2022.  Patient had a CT scan December 22, 2021.  He had an exophytic lesion with coarse calcification in lower pole left kidney with no significant change.  In October 02, 2020 he had bilateral too small to characterize renal lesions.  He had a large prostate.  He had a renal ultrasound December 22, 2021 of the right upper quadrant and liver. ? ?When I  looked at the CT scan in April 2023 and compared it to January 2022 the exophytic lesion approximately a centimeter in size and very acute and is protuberance is unchanged.  I showed it to my partner ? ?Patient was nonspecific when he came in and said he had a little bit of ascites on the CT scan.  Again I reviewed all the x-rays.  The ultrasound said he had advanced cirrhosis he has had no change in bladder function.  Frequency is stable.  No infections.  No blood for over a year.   ? ?Was recently in the hospital with chills and systemic inflammatory response syndrome.  He has a lot of medical comorbidities.  Urine culture and blood culture negative ? ? ?PMH: ?Past Medical History:  ?Diagnosis Date  ? Benign prostatic hypertrophy   ? s/p TURP  ? Cardiomyopathy ([place])   ? Carotid artery plaque   ? bilateral  ? CHF (congestive heart failure) ([place])   ? Chronic atrial fibrillation (HCC)   ? Complication of anesthesia   ? balance problem  ? Coronary artery disease, non-occlusive   ? Dyspnea   ? Dysrhythmia   ? atrial fib  ? Elevated lipids   ? Gross hematuria   ? Hypertension   ? Neuropathy, alcoholic ([place])   ? per Neurologic eval, remote  ? Nocturia   ? Poor balance   ? renal cell carcinoma 2002  ? left kidney cancer with partial nephrectomy.  ? Retinopathy   ? followed by [facility]  ? Sleep apnea   ? Thrombocytopenia, unspecified ([place])   ? Urethral stricture   ? Urinary frequency   ? Valvular cardiomyopathy ([place])   ? severe mitral and tricuspid insufficiency, ECHO July 2012  ? ? ?Surgical History: ?Past Surgical History:  ?Procedure Laterality Date  ? CYSTOSCOPY W/ RETROGRADES N/A 05/06/2013  ? Procedure: CYSTOSCOPY WITH RETROGRADE PYELOGRAM,  BALLOON DILATION OF URETHRAL STRICTURE;  Surgeon: Dutch Gray, MD;  Location: [facility];  Service: Urology;  Laterality: N/A;  ? EYE SURGERY    ? bilateral cataracts removed  ? left rotator cuff repair    ? PARTIAL NEPHRECTOMY Left   ? 10'02  ? PROSTATE SURGERY    ? ROTATOR CUFF REPAIR  Right   ? SHOULDER ARTHROSCOPY WITH OPEN ROTATOR CUFF REPAIR Left 07/02/2018  ? Procedure: SHOULDER ARTHROSCOPY WITH MINI OPEN ROTATOR CUFF REPAIR INCLUDING SUBSCAPULARIS ,SUBACROMINAL DECOMPRESSION.;  Surgeon: Thornton Park, MD;  Location: [facility] ORS;  Service: Orthopedics;  Laterality: Left;  ? TOTAL HIP ARTHROPLASTY Right 06/19/2017  ? Procedure: TOTAL HIP ARTHROPLASTY ANTERIOR  APPROACH;  Surgeon: Hessie Knows, MD;  Location: [facility] ORS;  Service: Orthopedics;  Laterality: Right;  ? TRANSURETHRAL RESECTION OF PROSTATE  2003  ? ? ?Home Medications:  ?Allergies as of 12/31/2021   ? ?   Reactions  ? Ace Inhibitors Cough  ? REACTION: cough  ? Latex Rash  ? Tape Rash, Other (See Comments)  ? OK to use paper tape ?OK to use paper tape  ? Tapentadol Rash  ? ?  ? ?  ?Medication List  ?  ? ?  ? Accurate as of Dec 31, 2021 11:00 AM. If you have any questions, ask your nurse or doctor.  ?  ?  ? ?  ? ?acetaminophen 500 MG tablet ?Commonly known as: TYLENOL ?Take 1,000 mg by mouth 2 (two) times daily. ?  ?furosemide 40 MG tablet ?Commonly known as: LASIX ?Take 40 mg by mouth daily. ?  ?losartan-hydrochlorothiazide 100-12.5 MG tablet ?Commonly known as: HYZAAR ?Take 1 tablet by mouth daily. ?  ?metoprolol succinate 25 MG 24 hr tablet ?Commonly known as: TOPROL-XL ?Take 12.5 mg by mouth daily. ?  ?PRESERVISION AREDS 2 PO ?Take 1 capsule by mouth 2 (two) times daily. ?  ?simvastatin 40 MG tablet ?Commonly known as: ZOCOR ?Take 1 tablet (40 mg total) by mouth every morning. ?  ?spironolactone 25 MG tablet ?Commonly known as: ALDACTONE ?Take 1 tablet by mouth daily. ?  ?warfarin 2 MG tablet ?Commonly known as: COUMADIN ?Take 2 mg by mouth as directed. Take 3 mg (1 and 1/2 tablets) Sunday, Monday, Wednesday, and Friday and 2 mg (1 tablet) on Tuesday, Thursday, and Saturday ?  ? ?  ? ? ?Allergies:  ?Allergies  ?Allergen Reactions  ? Ace Inhibitors Cough  ?  REACTION: cough  ? Latex Rash  ? Tape Rash and Other (See Comments)  ?  OK to  use paper tape ?OK to use paper tape  ? Tapentadol Rash  ? ? ?Family History: ?Family History  ?Problem Relation Age of Onset  ? Heart disease Mother 96  ?     heart failure  ? Early death Father 17  ? Heart di

## 2022-03-20 ENCOUNTER — Other Ambulatory Visit: Payer: Self-pay | Admitting: Student

## 2022-03-20 DIAGNOSIS — R9389 Abnormal findings on diagnostic imaging of other specified body structures: Secondary | ICD-10-CM

## 2022-03-20 DIAGNOSIS — R911 Solitary pulmonary nodule: Secondary | ICD-10-CM

## 2022-03-22 ENCOUNTER — Ambulatory Visit
Admission: RE | Admit: 2022-03-22 | Discharge: 2022-03-22 | Disposition: A | Discharge status: Home / Self Care | Payer: Medicare Other | Source: Ambulatory Visit | Attending: Student | Admitting: Student

## 2022-03-22 DIAGNOSIS — R9389 Abnormal findings on diagnostic imaging of other specified body structures: Secondary | ICD-10-CM | POA: Diagnosis present

## 2022-03-22 DIAGNOSIS — R911 Solitary pulmonary nodule: Secondary | ICD-10-CM | POA: Diagnosis present

## 2022-04-09 ENCOUNTER — Encounter: Payer: Self-pay | Admitting: Cardiology

## 2022-04-09 ENCOUNTER — Encounter
Admission: RE | Disposition: A | Discharge status: Home / Self Care | Payer: Self-pay | Source: Home / Self Care | Attending: Cardiology

## 2022-04-09 ENCOUNTER — Other Ambulatory Visit: Payer: Self-pay

## 2022-04-09 ENCOUNTER — Observation Stay
Admission: RE | Admit: 2022-04-09 | Discharge: 2022-04-10 | Disposition: A | Discharge status: Home / Self Care | Payer: Medicare Other | Attending: Cardiology | Admitting: Cardiology

## 2022-04-09 DIAGNOSIS — I4510 Unspecified right bundle-branch block: Secondary | ICD-10-CM | POA: Insufficient documentation

## 2022-04-09 DIAGNOSIS — Z79899 Other long term (current) drug therapy: Secondary | ICD-10-CM | POA: Diagnosis not present

## 2022-04-09 DIAGNOSIS — Z950 Presence of cardiac pacemaker: Secondary | ICD-10-CM | POA: Insufficient documentation

## 2022-04-09 DIAGNOSIS — I4891 Unspecified atrial fibrillation: Secondary | ICD-10-CM | POA: Diagnosis not present

## 2022-04-09 DIAGNOSIS — Z006 Encounter for examination for normal comparison and control in clinical research program: Secondary | ICD-10-CM | POA: Insufficient documentation

## 2022-04-09 DIAGNOSIS — Z7901 Long term (current) use of anticoagulants: Secondary | ICD-10-CM | POA: Diagnosis not present

## 2022-04-09 DIAGNOSIS — I495 Sick sinus syndrome: Secondary | ICD-10-CM | POA: Diagnosis not present

## 2022-04-09 DIAGNOSIS — Z91040 Latex allergy status: Secondary | ICD-10-CM | POA: Diagnosis not present

## 2022-04-09 HISTORY — PX: PACEMAKER LEADLESS INSERTION: EP1219

## 2022-04-09 SURGERY — PACEMAKER LEADLESS INSERTION
Anesthesia: Moderate Sedation

## 2022-04-09 MED ORDER — SODIUM CHLORIDE 0.9 % IV SOLN: 250.0000 mL | INTRAVENOUS | Status: DC | PRN

## 2022-04-09 MED ORDER — LIDOCAINE HCL 1 % IJ SOLN
INTRAMUSCULAR | Status: AC
  Filled 2022-04-09: qty 20

## 2022-04-09 MED ORDER — LOSARTAN POTASSIUM 50 MG PO TABS
100.0000 mg | ORAL_TABLET | Freq: Every day | ORAL | Status: DC
  Administered 2022-04-09 – 2022-04-10 (×2): 100 mg via ORAL
  Filled 2022-04-09 (×2): qty 2

## 2022-04-09 MED ORDER — FLUTICASONE PROPIONATE 50 MCG/ACT NA SUSP
1.0000 | Freq: Every day | NASAL | Status: DC
  Administered 2022-04-09: 1 via NASAL
  Filled 2022-04-09: qty 16

## 2022-04-09 MED ORDER — HEPARIN SODIUM (PORCINE) 1000 UNIT/ML IJ SOLN
INTRAMUSCULAR | Status: AC
  Filled 2022-04-09: qty 10

## 2022-04-09 MED ORDER — SODIUM CHLORIDE 0.9% FLUSH
3.0000 mL | Freq: Two times a day (BID) | INTRAVENOUS | Status: DC
Start: 2022-04-09 — End: 2022-04-10

## 2022-04-09 MED ORDER — FENTANYL CITRATE (PF) 100 MCG/2ML IJ SOLN
INTRAMUSCULAR | Status: DC | PRN
  Administered 2022-04-09: 25 ug via INTRAVENOUS

## 2022-04-09 MED ORDER — FENTANYL CITRATE (PF) 100 MCG/2ML IJ SOLN
INTRAMUSCULAR | Status: AC
  Filled 2022-04-09: qty 2

## 2022-04-09 MED ORDER — SODIUM CHLORIDE 0.9% FLUSH
3.0000 mL | Freq: Two times a day (BID) | INTRAVENOUS | Status: DC
  Administered 2022-04-09 – 2022-04-10 (×2): 3 mL via INTRAVENOUS

## 2022-04-09 MED ORDER — HEPARIN SODIUM (PORCINE) 1000 UNIT/ML IJ SOLN
INTRAMUSCULAR | Status: DC | PRN
  Administered 2022-04-09: 3000 [IU] via INTRAVENOUS
  Administered 2022-04-09: 5000 [IU] via INTRAVENOUS

## 2022-04-09 MED ORDER — HEPARIN (PORCINE) IN NACL 1000-0.9 UT/500ML-% IV SOLN
INTRAVENOUS | Status: AC
  Filled 2022-04-09: qty 500

## 2022-04-09 MED ORDER — CHLORHEXIDINE GLUCONATE CLOTH 2 % EX PADS
6.0000 | MEDICATED_PAD | Freq: Every day | CUTANEOUS | Status: DC
  Administered 2022-04-09 – 2022-04-10 (×2): 6 via TOPICAL

## 2022-04-09 MED ORDER — METOPROLOL SUCCINATE ER 50 MG PO TB24
25.0000 mg | ORAL_TABLET | Freq: Every day | ORAL | Status: DC
  Administered 2022-04-10: 25 mg via ORAL
  Filled 2022-04-09 (×2): qty 1

## 2022-04-09 MED ORDER — MIDAZOLAM HCL 2 MG/2ML IJ SOLN
INTRAMUSCULAR | Status: AC
  Filled 2022-04-09: qty 2

## 2022-04-09 MED ORDER — HEPARIN (PORCINE) IN NACL 1000-0.9 UT/500ML-% IV SOLN
INTRAVENOUS | Status: DC | PRN
  Administered 2022-04-09: 500 mL

## 2022-04-09 MED ORDER — SODIUM CHLORIDE 0.9% FLUSH: 3.0000 mL | INTRAVENOUS | Status: DC | PRN

## 2022-04-09 MED ORDER — ACETAMINOPHEN 325 MG PO TABS: 650.0000 mg | ORAL_TABLET | ORAL | Status: DC | PRN

## 2022-04-09 MED ORDER — HEPARIN (PORCINE) IN NACL 1000-0.9 UT/500ML-% IV SOLN
INTRAVENOUS | Status: AC
  Filled 2022-04-09: qty 1000

## 2022-04-09 MED ORDER — FUROSEMIDE 40 MG PO TABS
40.0000 mg | ORAL_TABLET | Freq: Every day | ORAL | Status: DC
  Administered 2022-04-09 – 2022-04-10 (×2): 40 mg via ORAL
  Filled 2022-04-09 (×2): qty 1

## 2022-04-09 MED ORDER — LIDOCAINE HCL (PF) 1 % IJ SOLN
INTRAMUSCULAR | Status: DC | PRN
  Administered 2022-04-09: 30 mL

## 2022-04-09 MED ORDER — SIMVASTATIN 40 MG PO TABS
40.0000 mg | ORAL_TABLET | Freq: Every day | ORAL | Status: DC
  Administered 2022-04-09: 40 mg via ORAL
  Filled 2022-04-09 (×2): qty 1

## 2022-04-09 MED ORDER — ONDANSETRON HCL 4 MG/2ML IJ SOLN: 4.0000 mg | Freq: Four times a day (QID) | INTRAMUSCULAR | Status: DC | PRN

## 2022-04-09 MED ORDER — SODIUM CHLORIDE 0.9% FLUSH
3.0000 mL | INTRAVENOUS | Status: DC | PRN
Start: 2022-04-09 — End: 2022-04-10

## 2022-04-09 MED ORDER — MIDAZOLAM HCL 2 MG/2ML IJ SOLN
INTRAMUSCULAR | Status: DC | PRN
  Administered 2022-04-09: 1 mg via INTRAVENOUS
  Administered 2022-04-09: .5 mg via INTRAVENOUS

## 2022-04-09 MED ORDER — SPIRONOLACTONE 25 MG PO TABS
25.0000 mg | ORAL_TABLET | Freq: Every day | ORAL | Status: DC
  Administered 2022-04-09 – 2022-04-10 (×2): 25 mg via ORAL
  Filled 2022-04-09 (×4): qty 1

## 2022-04-09 MED ORDER — IOHEXOL 300 MG/ML  SOLN
INTRAMUSCULAR | Status: DC | PRN
  Administered 2022-04-09: 40 mL

## 2022-04-09 MED ORDER — HEPARIN (PORCINE) IN NACL 2000-0.9 UNIT/L-% IV SOLN
INTRAVENOUS | Status: DC | PRN
  Administered 2022-04-09: 1000 mL

## 2022-04-09 MED ORDER — SODIUM CHLORIDE 0.9 % IV SOLN: INTRAVENOUS | Status: DC

## 2022-04-09 SURGICAL SUPPLY — 15 items
DILATOR VESSEL 38 20CM 12FR (INTRODUCER) ×1 IMPLANT
DILATOR VESSEL 38 20CM 14FR (INTRODUCER) ×1 IMPLANT
DILATOR VESSEL 38 20CM 18FR (INTRODUCER) ×1 IMPLANT
DILATOR VESSEL 38 20CM 8FR (INTRODUCER) ×1 IMPLANT
MICRA AV TRANSCATH PACING SYS (Pacemaker) ×2 IMPLANT
MICRA INTRODUCER SHEATH (SHEATH) ×2
NDL PERC 18GX7CM (NEEDLE) IMPLANT
NEEDLE PERC 18GX7CM (NEEDLE) ×2 IMPLANT
PACK CARDIAC CATH (CUSTOM PROCEDURE TRAY) ×2 IMPLANT
PAD ELECT DEFIB RADIOL ZOLL (MISCELLANEOUS) ×2 IMPLANT
SHEATH AVANTI 7FRX11 (SHEATH) ×1 IMPLANT
SHEATH INTRODUCER MICRA (SHEATH) IMPLANT
SUT SILK 0 FSL (SUTURE) ×1 IMPLANT
SYSTEM PACING TRNSCTH AV MICRA (Pacemaker) IMPLANT
WIRE AMPLATZ SS-J .035X180CM (WIRE) ×1 IMPLANT

## 2022-04-09 NOTE — Progress Notes (Signed)
Pt requesting to be placed on home CPAP. Dr. Clayborn Bigness paged. New orders obtained. RT made aware.

## 2022-04-10 DIAGNOSIS — I495 Sick sinus syndrome: Secondary | ICD-10-CM

## 2022-04-10 DIAGNOSIS — Z950 Presence of cardiac pacemaker: Secondary | ICD-10-CM | POA: Diagnosis not present

## 2022-04-10 LAB — BASIC METABOLIC PANEL
Anion gap: 4 — ABNORMAL LOW (ref 5–15)
BUN: 42 mg/dL — ABNORMAL HIGH (ref 8–23)
CO2: 24 mmol/L (ref 22–32)
Calcium: 8.8 mg/dL — ABNORMAL LOW (ref 8.9–10.3)
Chloride: 110 mmol/L (ref 98–111)
Creatinine, Ser: 1.46 mg/dL — ABNORMAL HIGH (ref 0.61–1.24)
GFR, Estimated: 46 mL/min — ABNORMAL LOW (ref >60)
Glucose, Bld: 94 mg/dL (ref 70–99)
Potassium: 4.6 mmol/L (ref 3.5–5.1)
Sodium: 138 mmol/L (ref 135–145)

## 2022-04-10 NOTE — Discharge Summary (Signed)
Physician Discharge Summary      Patient ID: Kyle Gutierrez MRN: [id number] DOB/AGE: 08-Jan-1932 88 y.o.  Admit date: 04/09/2022 Discharge date: 04/10/2022  Primary Discharge Diagnosis sick sinus syndrome Secondary Discharge Diagnosis same, s/p Micra leadless pacemaker implant  Significant Diagnostic Studies:   Micra Leadless pacemaker implant - 04/09/2022 The right groin was prepped and draped in the usual sterile manner.  Anesthesia was obtained 1% lidocaine locally.  Access to the right femoral vein was obtained with ultrasound guidance.  A 7 French sheath was introduced right femoral vein.  The right femoral vein was dilated with 8, 12, 14 and 18 [place] dilators.  Hydrophilic 23 French sheath was introduced right femoral vein.  Micra AV leadless pacemaker ([employer] [place]  [id number]) was positioned in the right ventricular apical septum under fluoroscopic guidance.  Optimal positioning was confirmed with contrast injection in RAO and LAO views.  Optimal implantation was confirmed post implant interrogation and the pull and hold test.  The tether was cut and delivery catheter removed.  Hemostasis was achieved with figure of 8-0 silk suture tie and manual compression.  This procedure interrogation revealed R waves 9.1 mV, impedance 1150 ohms, threshold 0.5 V at 0.24 ms pulse width.  Mode was set at VVI lower rate 60 bpm. Patient received 8000 units of heparin.  The patient received 25 mcg of fentanyl and 1 mg of Versed.  Total contrast 80 cc. Estimated blood loss <50 mL.   During this procedure medications were administered to achieve and maintain moderate conscious sedation while the patient's heart rate, blood pressure, and oxygen saturation were continuously monitored and I was present face-to-face 100% of this time.  Consults: none  Hospital Course: The patient was brought to the cardiac cath lab and underwent micra leadless pacemaker placement with Dr. Isaias Cowman on 04/09/2022. The patient tolerated with procedure well without complications. The device was interrogated post procedure and is functioning appropriately with good threshold and lead impedance. On 04/10/2022 the incision site was examined and found to be without significant erythema, tenderness to palpation, or apparent aneurysm. Figure of eight suture was removed at the beside by me on 8/9 and was covered with dry gauze and tegaderm dressing. The patient was given care instructions and warning symptoms to look out at the incision site and will follow up in office in 1 week, or sooner if needed.     Discharge Exam: Blood pressure 124/67, pulse 98, temperature 98.6 F (37 C), resp. rate 14, height 5' 11.5" (1.816 m), weight 86.2 kg, SpO2 95 %.   PHYSICAL EXAM General: Pleasant elderly male, well nourished, in no acute distress. Wife at bedside HEENT:  Normocephalic and atraumatic. Neck:   No JVD.  Lungs: Normal respiratory effort on room air.   Heart: HRRR .  Abdomen: non-distended appearing.  Msk: Normal strength and tone for age. Extremities: No clubbing, cyanosis, edema. R groin with dry gauze and tegaderm in place with trace ecchymosis, without significant erythema, bleeding or apparent aneurysm Neuro: Alert and oriented x3 Psych:  mood appropriate for situation.    Labs:   Lab Results  Component Value Date   WBC 10.3 12/23/2021   HGB 12.3 (L) 12/23/2021   HCT 38.7 (L) 12/23/2021   MCV 95.6 12/23/2021   PLT 82 (L) 12/23/2021    Recent Labs  Lab 04/10/22 0519  NA 138  K 4.6  CL 110  CO2 24  BUN 42*  CREATININE 1.46*  CALCIUM 8.8*  GLUCOSE  28    FOLLOW UP PLANS AND APPOINTMENTS  Allergies as of 04/10/2022       Reactions   Ace Inhibitors Cough   REACTION: cough   Latex Rash   Tape Rash, Other (See Comments)   OK to use paper tape OK to use paper tape   Tapentadol Rash        Medication List     STOP taking these medications     losartan-hydrochlorothiazide 100-12.5 MG tablet Commonly known as: HYZAAR       TAKE these medications    acetaminophen 500 MG tablet Commonly known as: TYLENOL Take 1,000 mg by mouth 2 (two) times daily.   furosemide 40 MG tablet Commonly known as: LASIX Take 40 mg by mouth daily.   losartan 100 MG tablet Commonly known as: COZAAR Take 100 mg by mouth daily.   metoprolol succinate 25 MG 24 hr tablet Commonly known as: TOPROL-XL Take 12.5 mg by mouth daily.   PRESERVISION AREDS 2 PO Take 1 capsule by mouth 2 (two) times daily.   simvastatin 40 MG tablet Commonly known as: ZOCOR Take 1 tablet (40 mg total) by mouth every morning.   spironolactone 25 MG tablet Commonly known as: ALDACTONE Take 1 tablet by mouth daily.   warfarin 2 MG tablet Commonly known as: COUMADIN Take 2 mg by mouth as directed. Take 3 mg (1 and 1/2 tablets) Sunday, Monday, Wednesday, and Friday and 2 mg (1 tablet) on Tuesday, Thursday, and Saturday        Follow-up Information     Corey Skains, MD. Go in 1 week(s).   Specialty: Cardiology Contact information: [facility] [place] [address] [phone]                 BRING ALL MEDICATIONS WITH YOU TO FOLLOW UP APPOINTMENTS  Time spent with patient : >30 mins Signed:  Tristan Schroeder , PA-C 04/10/2022, 10:08 AM

## 2022-04-10 NOTE — Discharge Instructions (Addendum)
Restart your Warfarin (Coumadin) starting tomorrow 8/10 and continue your regular dosing schedule. Please avoid showering/submerging yourself in water for the next week. If your bandage gets wet or starts to fall off, replace it with another piece of gauze and the Tegaderm (clear bandage I provided). You should try to keep it covered for a week. You will follow up with Dr. Nehemiah Massed in the office in about 1 week. If you have bleeding from the site, lie down and apply firm pressure for 20 minutes, if it continues to bleed, call our office [phone]). Avoid heavy lifting, squatting (other than sitting) and strenuous activity for 1 week. You will get a call from [employer] to set up your pacemaker and the [employer] device. You do not need to bring this device with you to appointments. It is used to monitor your pacemaker from home.

## 2022-04-10 NOTE — Progress Notes (Signed)
  Transition of Care [facility]) Screening Note   Patient Details  Name: Kyle Gutierrez Date of Birth: 04-11-1932   Transition of Care [facility]) CM/SW Contact:    Alberteen Sam, LCSW Phone Number: 04/10/2022, 10:33 AM    Transition of Care Department [facility]) has reviewed patient and no TOC needs have been identified at this time. We will continue to monitor patient advancement through interdisciplinary progression rounds. If new patient transition needs arise, please place a TOC consult.  [place], [place]

## 2022-05-02 ENCOUNTER — Other Ambulatory Visit (HOSPITAL_COMMUNITY): Payer: Self-pay | Admitting: Pulmonary Disease

## 2022-05-02 ENCOUNTER — Other Ambulatory Visit: Payer: Self-pay | Admitting: Pulmonary Disease

## 2022-05-02 DIAGNOSIS — R918 Other nonspecific abnormal finding of lung field: Secondary | ICD-10-CM

## 2022-05-08 ENCOUNTER — Ambulatory Visit
Admission: RE | Admit: 2022-05-08 | Discharge: 2022-05-08 | Disposition: A | Discharge status: Home / Self Care | Payer: Medicare Other | Source: Ambulatory Visit | Attending: Pulmonary Disease | Admitting: Pulmonary Disease

## 2022-05-08 DIAGNOSIS — J439 Emphysema, unspecified: Secondary | ICD-10-CM | POA: Diagnosis not present

## 2022-05-08 DIAGNOSIS — R188 Other ascites: Secondary | ICD-10-CM | POA: Diagnosis not present

## 2022-05-08 DIAGNOSIS — I700 Atherosclerosis of aorta: Secondary | ICD-10-CM | POA: Diagnosis not present

## 2022-05-08 DIAGNOSIS — R918 Other nonspecific abnormal finding of lung field: Secondary | ICD-10-CM | POA: Insufficient documentation

## 2022-05-08 DIAGNOSIS — I2510 Atherosclerotic heart disease of native coronary artery without angina pectoris: Secondary | ICD-10-CM | POA: Insufficient documentation

## 2022-05-08 DIAGNOSIS — K7460 Unspecified cirrhosis of liver: Secondary | ICD-10-CM | POA: Diagnosis not present

## 2022-05-08 DIAGNOSIS — J90 Pleural effusion, not elsewhere classified: Secondary | ICD-10-CM | POA: Insufficient documentation

## 2022-05-08 LAB — GLUCOSE, CAPILLARY: Glucose-Capillary: 83 mg/dL (ref 70–99)

## 2022-05-08 MED ORDER — FLUDEOXYGLUCOSE F - 18 (FDG) INJECTION
9.8000 | Freq: Once | INTRAVENOUS | Status: AC | PRN
  Administered 2022-05-08: 10.51 via INTRAVENOUS

## 2022-05-14 ENCOUNTER — Ambulatory Visit
Admission: RE | Admit: 2022-05-14 | Discharge: 2022-05-14 | Disposition: A | Discharge status: Home / Self Care | Payer: Medicare Other | Source: Ambulatory Visit | Attending: Radiation Oncology | Admitting: Radiation Oncology

## 2022-05-14 ENCOUNTER — Encounter: Payer: Self-pay | Admitting: *Deleted

## 2022-05-14 DIAGNOSIS — R918 Other nonspecific abnormal finding of lung field: Secondary | ICD-10-CM | POA: Insufficient documentation

## 2022-05-14 DIAGNOSIS — M899 Disorder of bone, unspecified: Secondary | ICD-10-CM | POA: Diagnosis not present

## 2022-05-14 DIAGNOSIS — R911 Solitary pulmonary nodule: Secondary | ICD-10-CM

## 2022-05-14 NOTE — Progress Notes (Signed)
Met with patient after initial visit with Dr. Baruch Gouty. All questions answered during visit. Informed pt that can get him scheduled to see Dr. Tasia Catchings on Friday 9/15 at 9am for initial consultation. Pt is agreeable to this appt. Appt information given to patient along with contact info. Instructed to call with any questions or needs. Pt verbalized understanding. Nothing further needed at this time.

## 2022-05-14 NOTE — Consult Note (Signed)
NEW PATIENT EVALUATION  Name: Kyle Gutierrez  MRN: [id number]  Date:   05/14/2022     DOB: April 16, 1932   This 88 y.o. male patient presents to the clinic for initial evaluation of probable stage IV primary bronchogenic carcinoma and patient with multiple comorbidities for consideration of palliative radiation  REFERRING PHYSICIAN: Dion Body, MD  CHIEF COMPLAINT: No chief complaint on file.   DIAGNOSIS: There were no encounter diagnoses.   PREVIOUS INVESTIGATIONS:  PET CT scan reviewed Labs reviewed Clinical notes reviewed  HPI: Patient is an 88 year old male seen by pulmonology for increasing shortness of breath.  He has multiple comorbidities including sick sinus syndrome valvular cardiomyopathy PAF aortic atherosclerosis severe tricuspid regurg moderate mitral valve regurg.  PET CT scan was performed showing findings most consistent with stage IV primary bronchogenic carcinoma with bilateral pulmonary nodules hypermetabolic lymph nodes in the low neck and chest and hypermetabolic bone lesions.  The majority of his disease in the left chest.  He is now referred to [facility] onc[facility] for consideration of palliative treatment.  PLANNED TREATMENT REGIMEN: Referral to medical oncology and possible palliative radiation therapy to left chest  PAST MEDICAL HISTORY:  has a past medical history of Benign prostatic hypertrophy, Cardiomyopathy ([place]), Carotid artery plaque, CHF (congestive heart failure) ([place]), Chronic atrial fibrillation ([place]), Complication of anesthesia, Coronary artery disease, non-occlusive, Dyspnea, Dysrhythmia, Elevated lipids, Gross hematuria, Hypertension, Neuropathy, alcoholic ([place]), Nocturia, Poor balance, renal cell carcinoma (2002), Retinopathy, Sleep apnea, Thrombocytopenia, unspecified ([place]), Urethral stricture, Urinary frequency, and Valvular cardiomyopathy ([place]).    PAST SURGICAL HISTORY:  Past Surgical History:  Procedure Laterality Date   CYSTOSCOPY W/  RETROGRADES N/A 05/06/2013   Procedure: CYSTOSCOPY WITH RETROGRADE PYELOGRAM,  BALLOON DILATION OF URETHRAL STRICTURE;  Surgeon: Dutch Gray, MD;  Location: [facility];  Service: Urology;  Laterality: N/A;   EYE SURGERY     bilateral cataracts removed   left rotator cuff repair     PACEMAKER LEADLESS INSERTION N/A 04/09/2022   Procedure: PACEMAKER LEADLESS INSERTION;  Surgeon: Isaias Cowman, MD;  Location: [facility];  Service: Cardiovascular;  Laterality: N/A;   PARTIAL NEPHRECTOMY Left    10'02   PROSTATE SURGERY     ROTATOR CUFF REPAIR Right    SHOULDER ARTHROSCOPY WITH OPEN ROTATOR CUFF REPAIR Left 07/02/2018   Procedure: SHOULDER ARTHROSCOPY WITH MINI OPEN ROTATOR CUFF REPAIR INCLUDING SUBSCAPULARIS ,SUBACROMINAL DECOMPRESSION.;  Surgeon: Thornton Park, MD;  Location: [facility];  Service: Orthopedics;  Laterality: Left;   TOTAL HIP ARTHROPLASTY Right 06/19/2017   Procedure: TOTAL HIP ARTHROPLASTY ANTERIOR APPROACH;  Surgeon: Hessie Knows, MD;  Location: [facility] OR[facility];  Service: Orthopedics;  Laterality: Right;   TRANSURETHRAL RESECTION OF PROSTATE  2003    FAMILY HISTORY: family history includes Cancer in his sister; Early death (age of onset: 51) in his father; Heart disease in his father; Heart disease (age of onset: 77) in his mother.  SOCIAL HISTORY:  reports that he quit smoking about 45 years ago. His smoking use included cigarettes. He has never used smokeless tobacco. He reports current alcohol use. He reports that he does not use drugs.  ALLERGIES: Ace inhibitors, Latex, Tape, and Tapentadol  MEDICATIONS:  Current Outpatient Medications  Medication Sig Dispense Refill   acetaminophen (TYLENOL) 500 MG tablet Take 1,000 mg by mouth 2 (two) times daily.      furosemide (LASIX) 40 MG tablet Take 40 mg by mouth daily.     losartan (COZAAR) 100 MG tablet Take 100 mg by mouth daily.  metoprolol succinate (TOPROL-XL) 25 MG 24 hr tablet Take 12.5 mg by mouth daily.       Multiple Vitamins-Minerals (PRESERVISION AREDS 2 PO) Take 1 capsule by mouth 2 (two) times daily.     simvastatin (ZOCOR) 40 MG tablet Take 1 tablet (40 mg total) by mouth every morning. 90 tablet 2   spironolactone (ALDACTONE) 25 MG tablet Take 1 tablet by mouth daily.     warfarin (COUMADIN) 2 MG tablet Take 2 mg by mouth as directed. Take 3 mg (1 and 1/2 tablets) Sunday, Monday, Wednesday, and Friday and 2 mg (1 tablet) on Tuesday, Thursday, and Saturday     No current facility-administered medications for this encounter.    ECOG PERFORMANCE STATUS:  1 - Symptomatic but completely ambulatory  REVIEW OF SYSTEMS: Patient with sick sinus syndrome advanced aortic atherosclerosis tricuspid regurg. Patient denies any weight loss, fatigue, weakness, fever, chills or night sweats. Patient denies any loss of vision, blurred vision. Patient denies any ringing  of the ears or hearing loss. No irregular heartbeat. Patient denies heart murmur or history of fainting. Patient denies any chest pain or pain radiating to her upper extremities. Patient denies any shortness of breath, difficulty breathing at night, cough or hemoptysis. Patient denies any swelling in the lower legs. Patient denies any nausea vomiting, vomiting of blood, or coffee ground material in the vomitus. Patient denies any stomach pain. Patient states has had normal bowel movements no significant constipation or diarrhea. Patient denies any dysuria, hematuria or significant nocturia. Patient denies any problems walking, swelling in the joints or loss of balance. Patient denies any skin changes, loss of hair or loss of weight. Patient denies any excessive worrying or anxiety or significant depression. Patient denies any problems with insomnia. Patient denies excessive thirst, polyuria, polydipsia. Patient denies any swollen glands, patient denies easy bruising or easy bleeding. Patient denies any recent infections, allergies or URI. Patient "s visual  fields have not changed significantly in recent time.   PHYSICAL EXAM: There were no vitals taken for this visit. Frail-appearing male in NAD.  Well-developed well-nourished patient in NAD. HEENT reveals PERLA, EOMI, discs not visualized.  Oral cavity is clear. No oral mucosal lesions are identified. Neck is clear without evidence of cervical or supraclavicular adenopathy. Lungs are clear to A&P. Cardiac examination is essentially unremarkable with regular rate and rhythm without murmur rub or thrill. Abdomen is benign with no organomegaly or masses noted. Motor sensory and DTR levels are equal and symmetric in the upper and lower extremities. Cranial nerves II through XII are grossly intact. Proprioception is intact. No peripheral adenopathy or edema is identified. No motor or sensory levels are noted. Crude visual fields are within normal range.  LABORATORY DATA: Labs reviewed    RADIOLOGY RESULTS: PET CT scan reviewed compatible with above-stated findings   IMPRESSION: Stage IV non-small cell lung cancer an 88 year old male  PLAN: This time like him to be evaluated by medical oncology.  Patient is a former patient of Dr. Gary Fleet we are setting up consultation.  I do not see a real role for palliative radiation therapy at this time.  Patient may benefit from immunotherapy and he may be excepting of a biopsy should that be necessary.  After patient seen by Dr. Grayland Ormond and we can come up with a treatment plan and patient is comfortable with that.  He has no evidence of obstruction of his lung hemoptysis or bone pain at this time although would reserve palliative radiation therapy  should those things develop.  Patient comprehends my recommendations well.  I would like to take this opportunity to thank you for allowing me to participate in the care of your patient.Noreene Filbert, MD

## 2022-05-15 ENCOUNTER — Other Ambulatory Visit: Payer: Medicare Other

## 2022-05-17 ENCOUNTER — Encounter: Payer: Self-pay | Admitting: Oncology

## 2022-05-17 ENCOUNTER — Encounter: Payer: Self-pay | Admitting: *Deleted

## 2022-05-17 ENCOUNTER — Inpatient Hospital Stay: Payer: Medicare Other | Attending: Oncology | Admitting: Oncology

## 2022-05-17 ENCOUNTER — Inpatient Hospital Stay: Payer: Medicare Other

## 2022-05-17 VITALS — BP 101/63 | HR 79 | Temp 97.8°F | Resp 20 | Ht 71.0 in | Wt 203.0 lb

## 2022-05-17 DIAGNOSIS — Z7901 Long term (current) use of anticoagulants: Secondary | ICD-10-CM | POA: Diagnosis not present

## 2022-05-17 DIAGNOSIS — D649 Anemia, unspecified: Secondary | ICD-10-CM | POA: Diagnosis not present

## 2022-05-17 DIAGNOSIS — R748 Abnormal levels of other serum enzymes: Secondary | ICD-10-CM

## 2022-05-17 DIAGNOSIS — Z79899 Other long term (current) drug therapy: Secondary | ICD-10-CM | POA: Insufficient documentation

## 2022-05-17 DIAGNOSIS — I4891 Unspecified atrial fibrillation: Secondary | ICD-10-CM

## 2022-05-17 DIAGNOSIS — Z7189 Other specified counseling: Secondary | ICD-10-CM

## 2022-05-17 DIAGNOSIS — N1831 Chronic kidney disease, stage 3a: Secondary | ICD-10-CM

## 2022-05-17 DIAGNOSIS — R591 Generalized enlarged lymph nodes: Secondary | ICD-10-CM | POA: Diagnosis not present

## 2022-05-17 DIAGNOSIS — R911 Solitary pulmonary nodule: Secondary | ICD-10-CM | POA: Diagnosis not present

## 2022-05-17 DIAGNOSIS — M899 Disorder of bone, unspecified: Secondary | ICD-10-CM | POA: Diagnosis not present

## 2022-05-17 DIAGNOSIS — Z87891 Personal history of nicotine dependence: Secondary | ICD-10-CM

## 2022-05-17 DIAGNOSIS — D631 Anemia in chronic kidney disease: Secondary | ICD-10-CM

## 2022-05-17 DIAGNOSIS — D509 Iron deficiency anemia, unspecified: Secondary | ICD-10-CM

## 2022-05-17 LAB — CBC WITH DIFFERENTIAL/PLATELET
Abs Immature Granulocytes: 0.03 10*3/uL (ref 0.00–0.07)
Basophils Absolute: 0 10*3/uL (ref 0.0–0.1)
Basophils Relative: 0 %
Eosinophils Absolute: 0.2 10*3/uL (ref 0.0–0.5)
Eosinophils Relative: 2 %
HCT: 39.1 % (ref 39.0–52.0)
Hemoglobin: 12.4 g/dL — ABNORMAL LOW (ref 13.0–17.0)
Immature Granulocytes: 0 %
Lymphocytes Relative: 15 %
Lymphs Abs: 1.2 10*3/uL (ref 0.7–4.0)
MCH: 31.1 pg (ref 26.0–34.0)
MCHC: 31.7 g/dL (ref 30.0–36.0)
MCV: 98 fL (ref 80.0–100.0)
Monocytes Absolute: 0.6 10*3/uL (ref 0.1–1.0)
Monocytes Relative: 7 %
Neutro Abs: 5.8 10*3/uL (ref 1.7–7.7)
Neutrophils Relative %: 76 %
Platelets: 129 10*3/uL — ABNORMAL LOW (ref 150–400)
RBC: 3.99 MIL/uL — ABNORMAL LOW (ref 4.22–5.81)
RDW: 14.3 % (ref 11.5–15.5)
WBC: 7.7 10*3/uL (ref 4.0–10.5)
nRBC: 0 % (ref 0.0–0.2)

## 2022-05-17 LAB — COMPREHENSIVE METABOLIC PANEL
ALT: 25 U/L (ref 0–44)
AST: 24 U/L (ref 15–41)
Albumin: 3.8 g/dL (ref 3.5–5.0)
Alkaline Phosphatase: 191 U/L — ABNORMAL HIGH (ref 38–126)
Anion gap: 7 (ref 5–15)
BUN: 43 mg/dL — ABNORMAL HIGH (ref 8–23)
CO2: 25 mmol/L (ref 22–32)
Calcium: 9.5 mg/dL (ref 8.9–10.3)
Chloride: 108 mmol/L (ref 98–111)
Creatinine, Ser: 1.38 mg/dL — ABNORMAL HIGH (ref 0.61–1.24)
GFR, Estimated: 49 mL/min — ABNORMAL LOW (ref >60)
Glucose, Bld: 121 mg/dL — ABNORMAL HIGH (ref 70–99)
Potassium: 4.2 mmol/L (ref 3.5–5.1)
Sodium: 140 mmol/L (ref 135–145)
Total Bilirubin: 1.1 mg/dL (ref 0.3–1.2)
Total Protein: 6.9 g/dL (ref 6.5–8.1)

## 2022-05-17 NOTE — Progress Notes (Signed)
Patient here for initial oncology appointment, expresses concerns of SOB

## 2022-05-17 NOTE — Progress Notes (Signed)
Hematology/Oncology Consult Note [phone] [phone]     REFERRING PROVIDER: Dion Body, MD   Patient Care Team: Dion Body, MD as PCP - General (Family Medicine) Telford Nab, RN as Oncology Nurse Navigator  CHIEF COMPLAINTS/PURPOSE OF CONSULTATION:  Lung nodule, lymphadenopathy  HISTORY OF PRESENTING ILLNESS:  Kyle Gutierrez 88 y.o. male presents to establish care for multiple lung nodules, lymphadenopathy, abnormal imagings.  Patient follows up with cardiology for extensive cardiac history.  Patient had a chest x-ray done on 03/13/2022 which showed a pulmonary nodule followed by a CT scan. 03/22/2022, CT chest without contrast showed bilateral pulmonary nodules and mediastinal lymphadenopathy most consistent with neoplastic process.  Primary lesion is not definitively identified on this study.  Small volume on upper abdominal ascites.  Low attenuating liver with an irregular border worrisome for cirrhosis.  Marked cardiomegaly, coronary artery disease.  Aortic atherosclerosis, emphysema. 04/10/2022 patient underwent Micra Leadless pacemaker implant   Patient was referred to establish care with pulmonology Dr. Lanney Gins for further evaluation.  Patient was seen on 04/29/2022 by Dr. Lanney Gins.  Notes from that encounter is currently not available to me in EMR via [facility].  05/10/2022 PET scan showed  Hypermetabolic lower neck lymphadenopathy- Left level 2 lymph nodes measure up to 6 mm (2/47), SUV max 1.8. Hypermetabolic left level 5 lymph nodes with index 6 mm lymph node (2/56), SUV max 2.2.  hypermetabolic supraclavicle mediastinal and left hilar adenopathy,Index low right paratracheal lymph node measures 1.3 cm (2/84), SUV max 6.4.  Hypermetabolic bilateral pulmonary nodules.Index spiculated nodule in the apical left upper lobe measures 2.5 x 3.1 cm (2/73) and has enlarged minimally from 03/22/2022 with SUV max 10.4. Scattered  hypermetabolic osseous lesions, index lytic lesion in the chest which showed anterior left sixth rib has an SUV max 4.7.  05/25/22 patient establish care with radiation oncology Dr. Baruch Gouty.  It was felt that there was no role of palliative radiation at this point.  Patient was referred to establish care with hematology oncology for further evaluation.  Patient was accompanied by wife today.  He denies any pain currently. Patient has atrial fibrillation and is on anticoagulation with Coumadin.  Former smoker, quitting many years ago.   MEDICAL HISTORY:  Past Medical History:  Diagnosis Date   Benign prostatic hypertrophy    s/p TURP   Cardiomyopathy ([place])    Carotid artery plaque    bilateral   CHF (congestive heart failure) (HCC)    Chronic atrial fibrillation (HCC)    Complication of anesthesia    balance problem   Coronary artery disease, non-occlusive    Dyspnea    Dysrhythmia    atrial fib   Elevated lipids    Gross hematuria    Hypertension    Neuropathy, alcoholic ([place])    per Neurologic eval, remote   Nocturia    Poor balance    renal cell carcinoma 2002   left kidney cancer with partial nephrectomy.   Retinopathy    followed by [facility]   Sleep apnea    Thrombocytopenia, unspecified (HCC)    Urethral stricture    Urinary frequency    Valvular cardiomyopathy (HCC)    severe mitral and tricuspid insufficiency, ECHO July 2012    SURGICAL HISTORY: Past Surgical History:  Procedure Laterality Date   CYSTOSCOPY W/ RETROGRADES N/A 05/06/2013   Procedure: CYSTOSCOPY WITH RETROGRADE PYELOGRAM,  BALLOON DILATION OF URETHRAL STRICTURE;  Surgeon: Dutch Gray, MD;  Location: [facility];  Service: Urology;  Laterality: N/A;  EYE SURGERY     bilateral cataracts removed   left rotator cuff repair     PACEMAKER LEADLESS INSERTION N/A 04/09/2022   Procedure: PACEMAKER LEADLESS INSERTION;  Surgeon: Isaias Cowman, MD;  Location: [facility];  Service: Cardiovascular;   Laterality: N/A;   PARTIAL NEPHRECTOMY Left    10'02   PROSTATE SURGERY     ROTATOR CUFF REPAIR Right    SHOULDER ARTHROSCOPY WITH OPEN ROTATOR CUFF REPAIR Left 07/02/2018   Procedure: SHOULDER ARTHROSCOPY WITH MINI OPEN ROTATOR CUFF REPAIR INCLUDING SUBSCAPULARIS ,SUBACROMINAL DECOMPRESSION.;  Surgeon: Thornton Park, MD;  Location: [facility];  Service: Orthopedics;  Laterality: Left;   TOTAL HIP ARTHROPLASTY Right 06/19/2017   Procedure: TOTAL HIP ARTHROPLASTY ANTERIOR APPROACH;  Surgeon: Hessie Knows, MD;  Location: [facility];  Service: Orthopedics;  Laterality: Right;   TRANSURETHRAL RESECTION OF PROSTATE  2003    SOCIAL HISTORY: Social History   Socioeconomic History   Marital status: Married    Spouse name: Not on file   Number of children: Not on file   Years of education: Not on file   Highest education level: Not on file  Occupational History   Not on file  Tobacco Use   Smoking status: Former    Types: Cigarettes    Quit date: 03/29/1977    Years since quitting: 45.1   Smokeless tobacco: Never  Vaping Use   Vaping Use: Never used  Substance and Sexual Activity   Alcohol use: Yes    Comment: daily 5-6 ounces wine or whiskey    Drug use: No   Sexual activity: Yes  Other Topics Concern   Not on file  Social History Narrative   Not on file   Social Determinants of Health   Financial Resource Strain: Not on file  Food Insecurity: Not on file  Transportation Needs: Not on file  Physical Activity: Not on file  Stress: Not on file  Social Connections: Not on file  Intimate Partner Violence: Not on file    FAMILY HISTORY: Family History  Problem Relation Age of Onset   Heart disease Mother 17       heart failure   Early death Father 34   Heart disease Father        CAD   Cancer Sister        Breast    ALLERGIES:  is allergic to ace inhibitors, latex, tape, and tapentadol.  MEDICATIONS:  Current Outpatient Medications  Medication Sig Dispense Refill    acetaminophen (TYLENOL) 500 MG tablet Take 1,000 mg by mouth 2 (two) times daily.      fluticasone (FLONASE) 50 MCG/ACT nasal spray Place into the nose.     furosemide (LASIX) 40 MG tablet Take 40 mg by mouth daily.     losartan (COZAAR) 100 MG tablet Take 100 mg by mouth daily.     metoprolol succinate (TOPROL-XL) 25 MG 24 hr tablet Take 12.5 mg by mouth daily.      Multiple Vitamins-Minerals (PRESERVISION AREDS 2 PO) Take 1 capsule by mouth 2 (two) times daily.     simvastatin (ZOCOR) 40 MG tablet Take 1 tablet (40 mg total) by mouth every morning. 90 tablet 2   spironolactone (ALDACTONE) 25 MG tablet Take 1 tablet by mouth daily.     warfarin (COUMADIN) 2 MG tablet Take 2 mg by mouth as directed. Take 3 mg (1 and 1/2 tablets) Sunday, Monday, Wednesday, and Friday and 2 mg (1 tablet) on Tuesday, Thursday, and Saturday  No current facility-administered medications for this visit.    Review of Systems  Constitutional:  Negative for fatigue.  HENT:   Positive for hearing loss.   Eyes:  Negative for icterus.  Respiratory:  Positive for shortness of breath. Negative for chest tightness and cough.   Cardiovascular:  Negative for chest pain.  Gastrointestinal:  Negative for abdominal pain.  Musculoskeletal:  Negative for back pain.  Skin:  Negative for rash.  Neurological:  Negative for speech difficulty.  Hematological:  Negative for adenopathy.  Psychiatric/Behavioral:  Negative for confusion.      PHYSICAL EXAMINATION: ECOG PERFORMANCE STATUS: 1 - Symptomatic but completely ambulatory  Vitals:   05/17/22 0855  BP: 101/63  Pulse: 79  Resp: 20  Temp: 97.8 F (36.6 C)  SpO2: 99%   Filed Weights   05/17/22 0855  Weight: 203 lb (92.1 kg)    Physical Exam Constitutional:      General: He is not in acute distress.    Appearance: He is not diaphoretic.  HENT:     Head: Normocephalic and atraumatic.     Mouth/Throat:     Pharynx: No oropharyngeal exudate.  Eyes:      General: No scleral icterus.    Pupils: Pupils are equal, round, and reactive to light.  Cardiovascular:     Rate and Rhythm: Normal rate.  Pulmonary:     Effort: Pulmonary effort is normal. No respiratory distress.     Breath sounds: No wheezing.     Comments: Decreased breath sounds bilaterally Abdominal:     General: There is no distension.     Palpations: Abdomen is soft.     Tenderness: There is no abdominal tenderness.  Musculoskeletal:        General: No tenderness.     Cervical back: Normal range of motion and neck supple.  Skin:    General: Skin is warm and dry.     Findings: No erythema.  Neurological:     Mental Status: He is alert and oriented to person, place, and time. Mental status is at baseline.     Cranial Nerves: No cranial nerve deficit.     Motor: No abnormal muscle tone.  Psychiatric:        Mood and Affect: Mood and affect normal.      LABORATORY DATA:  I have reviewed the data as listed    Latest Ref Rng & Units 05/17/2022    9:50 AM 12/23/2021    8:21 AM 12/22/2021    2:06 AM  CBC  WBC 4.0 - 10.5 K/uL 7.7  10.3  13.3   Hemoglobin 13.0 - 17.0 g/dL 12.4  12.3  13.1   Hematocrit 39.0 - 52.0 % 39.1  38.7  42.2   Platelets 150 - 400 K/uL 129  82  83       Latest Ref Rng & Units 05/17/2022    9:50 AM 04/10/2022    5:19 AM 12/23/2021    8:21 AM  CMP  Glucose 70 - 99 mg/dL 121  94  112   BUN 8 - 23 mg/dL 43  42  36   Creatinine 0.61 - 1.24 mg/dL 1.38  1.46  1.38   Sodium 135 - 145 mmol/L 140  138  136   Potassium 3.5 - 5.1 mmol/L 4.2  4.6  3.9   Chloride 98 - 111 mmol/L 108  110  110   CO2 22 - 32 mmol/L 25  24  21    Calcium 8.9 -  10.3 mg/dL 9.5  8.8  8.6   Total Protein 6.5 - 8.1 g/dL 6.9     Total Bilirubin 0.3 - 1.2 mg/dL 1.1     Alkaline Phos 38 - 126 U/L 191     AST 15 - 41 U/L 24     ALT 0 - 44 U/L 25        RADIOGRAPHIC STUDIES: I have personally reviewed the radiological images as listed and agreed with the findings in the report. NM  PET Image Initial (PI) Skull Base To Thigh (F-18 FDG)  Result Date: 05/10/2022 CLINICAL DATA:  Initial treatment strategy for lung nodules. EXAM: NUCLEAR MEDICINE PET SKULL BASE TO THIGH TECHNIQUE: 10.5 mCi F-18 FDG was injected intravenously. Full-ring PET imaging was performed from the skull base to thigh after the radiotracer. CT data was obtained and used for attenuation correction and anatomic localization. Fasting blood glucose: 83 mg/dl COMPARISON:  CT chest 03/22/2022 and CT abdomen pelvis 12/22/2021. FINDINGS: Mediastinal blood pool activity: SUV max 1.5 Liver activity: SUV max NA NECK: Left level 2 lymph nodes measure up to 6 mm (2/47), SUV max 1.8. Hypermetabolic left level 5 lymph nodes with index 6 mm lymph node (2/56), SUV max 2.2. Incidental CT findings: None. CHEST: Hypermetabolic supraclavicular mediastinal and left hilar adenopathy. Index low right paratracheal lymph node measures 1.3 cm (2/84), SUV max 6.4. Hypermetabolic bilateral pulmonary nodules. Index spiculated nodule in the apical left upper lobe measures 2.5 x 3.1 cm (2/73) and has enlarged minimally from 03/22/2022 with SUV max 10.4. Incidental CT findings: Atherosclerotic calcification of the aorta and coronary arteries. Heart is enlarged. No pericardial effusion. Small left pleural effusion. Centrilobular emphysema. ABDOMEN/PELVIS: No abnormal hypermetabolism in the liver, adrenal glands, spleen or pancreas. No hypermetabolic lymph nodes. Incidental CT findings: Liver margin is irregular. Low-attenuation lesions in the liver measure up to 3.8 cm in the right hepatic lobe, likely cysts. Liver, gallbladder, adrenal glands otherwise unremarkable. Subcentimeter lesions in the kidneys, too small to characterize. No specific follow-up necessary. Spleen, pancreas, stomach and bowel are grossly unremarkable. Moderate ascites. SKELETON: Scattered hypermetabolic osseous lesions. Index lytic lesion in the anterior left sixth rib has an SUV max  4.7. Incidental CT findings: Right hip arthroplasty.  Degenerative changes in the spine IMPRESSION: 1. Findings most compatible with stage IV primary bronchogenic carcinoma as evidenced by hypermetabolic bilateral pulmonary nodules, hypermetabolic lymph nodes in the low neck and chest and hypermetabolic bone lesions. 2. Cirrhosis with moderate ascites. 3. Small left pleural effusion. 4. Aortic atherosclerosis (ICD10-I70.0). Coronary artery calcification. 5.  Emphysema (ICD10-J43.9). Electronically Signed   By: Lorin Picket M.D.   On: 05/10/2022 08:26    ASSESSMENT & PLAN:  1. Pulmonary nodule/lesion, solitary   2. Normocytic anemia   3. Lymphadenopathy   4. Goals of care, counseling/discussion   5. Anemia in stage 3a chronic kidney disease ([place])     #Abnormal CT and PET scan, bilateral lung nodules, lymphadenopathy, bone lesions. Imaging findings were reviewed and discussed with patient. Suspicious for underlying stage IV malignancy.  Probably stage IV lung cancer with bone metastasis. I recommend biopsy to establish tissue diagnosis. Recommend MRI brain with and without contrast to complete staging. Recommend consult interventional radiology for CT-guided biopsy of supraclavicular lymphadenopathy.We discussed about the possibility of nondiagnostic biopsy, and need of additional biopsy to establish tissue diagnosis. Further recommendations depending on tissue diagnosis/histology. Goals of care is with palliative intent.  Discussed with patient. Patient has multiple comorbidities.  Not a good candidate for  aggressive chemotherapy treatments.  Consider immunotherapy if clinically indicated. We will also refer patient to establish care with palliative care service once diagnosis is established. Check lab CBC CMP today.  His imaging also showed irregular liver contour with ascites, suggesting liver cirrhosis.  Impression was discussed with patient. low-attenuation lesions in the liver,  measuring up to 3.8 cm likely liver cysts.    #Today's blood work was reviewed.  Patient has a mild anemia with a hemoglobin of 12.1.  Creatinine is elevated at 1.38, close to his baseline, chronic kidney disease.  Anemia is likely secondary to chronic kidney disease, mild.  Continue monitor. Elevated alkaline phosphatase, likely secondary to bone metastasis.   Orders Placed This Encounter  Procedures   [place] CORE BIOPSY (LYMPH NODES)    Standing Status:   Future    Standing Expiration Date:   05/18/2023    Order Specific Question:   Lab orders requested (DO NOT place separate lab orders, these will be automatically ordered during procedure specimen collection):    Answer:   Surgical Pathology    Order Specific Question:   Reason for Exam (SYMPTOM  OR DIAGNOSIS REQUIRED)    Answer:   supraclavicular lymphadenopathy    Order Specific Question:   Preferred location?    Answer:   [facility]   MR Brain W Wo Contrast    Standing Status:   Future    Standing Expiration Date:   05/17/2023    Order Specific Question:   If indicated for the ordered procedure, I authorize the administration of contrast media per Radiology protocol    Answer:   Yes    Order Specific Question:   What is the patient's sedation requirement?    Answer:   No Sedation    Order Specific Question:   Does the patient have a pacemaker or implanted devices?    Answer:   Yes    Order Specific Question:   Manufacturer of pacemake or implanted device?    Answer:   [employer]    Order Specific Question:   Year of pacemaker or implanted device?    Answer:   04/09/2022    Order Specific Question:   Use SRS Protocol?    Answer:   No    Order Specific Question:   Preferred imaging location?    Answer:   [facility] (table limit - 550lbs)   CBC with Differential/Platelet    Standing Status:   Future    Number of Occurrences:   1    Standing Expiration Date:   05/18/2023   Comprehensive metabolic panel     Standing Status:   Future    Number of Occurrences:   1    Standing Expiration Date:   05/17/2023      All questions were answered. The patient knows to call the clinic with any problems, questions or concerns. No barriers to learning was detected.  Earlie Server, MD 05/17/2022

## 2022-05-17 NOTE — Progress Notes (Signed)
Met with patient and his wife during initial consult with Dr. Tasia Catchings to discuss recent imaging findings and next steps. All questions answered during visit. Pt will need further workup with brain MRI and US guided biopsy of the supraclavicular lymph node. Pt has pacemaker in place and centralized scheduling will reach out to patient to schedule. Will need to obtain clearance from cardiologist to hold coumadin before scheduling biopsy. Will work on obtaining clearance and notify pt with appt for biopsy once scheduled. Instructed pt to call with any questions or needs. Pt verbalized understanding.

## 2022-05-19 ENCOUNTER — Encounter: Payer: Self-pay | Admitting: *Deleted

## 2022-05-20 ENCOUNTER — Encounter: Payer: Self-pay | Admitting: *Deleted

## 2022-05-20 ENCOUNTER — Other Ambulatory Visit: Payer: Self-pay | Admitting: Oncology

## 2022-05-20 ENCOUNTER — Institutional Professional Consult (permissible substitution): Payer: Medicare Other | Admitting: Radiation Oncology

## 2022-05-20 MED ORDER — TRAMADOL HCL 50 MG PO TABS: 50.0000 mg | ORAL_TABLET | Freq: Four times a day (QID) | ORAL | 0 refills | Status: DC | PRN

## 2022-05-21 ENCOUNTER — Encounter: Payer: Self-pay | Admitting: *Deleted

## 2022-05-22 ENCOUNTER — Ambulatory Visit
Admission: RE | Admit: 2022-05-22 | Discharge: 2022-05-22 | Disposition: A | Discharge status: Home / Self Care | Payer: Medicare Other | Source: Ambulatory Visit | Attending: Radiation Oncology | Admitting: Radiation Oncology

## 2022-05-22 ENCOUNTER — Encounter: Payer: Self-pay | Admitting: *Deleted

## 2022-05-22 ENCOUNTER — Encounter: Payer: Self-pay | Admitting: Radiation Oncology

## 2022-05-22 DIAGNOSIS — E785 Hyperlipidemia, unspecified: Secondary | ICD-10-CM | POA: Insufficient documentation

## 2022-05-22 DIAGNOSIS — C7951 Secondary malignant neoplasm of bone: Secondary | ICD-10-CM

## 2022-05-22 DIAGNOSIS — R918 Other nonspecific abnormal finding of lung field: Secondary | ICD-10-CM | POA: Diagnosis not present

## 2022-05-22 NOTE — Progress Notes (Signed)
Radiation Oncology Follow up Note  Name: Kyle Gutierrez   Date:   05/22/2022 MRN:  [id number] DOB: Nov 09, 1931    This 88 y.o. male presents to the clinic today for left rib pain and patient with widespread metastatic disease from presumed stage IV primary bronchogenic carcinoma.  REFERRING PROVIDER: Dion Body, MD  HPI: Patient is an 88 year old male originally consulted for widespread metastatic disease from probable stage IV primary bronchogenic carcinoma.  The needle biopsy is being arranged for tissue diagnosis.  He recently felt a pop in his left lower ribs from tying his shoe.  He is seen for possible palliative radiation.  I have reviewed his PET CT scan it is miss stated in the report that there is a left sided sixth rib anterior hypermetabolic metastatic lesion although this is incorrect it is a right sided sixth rib hypermetabolic activity..  COMPLICATIONS OF TREATMENT: none  FOLLOW UP COMPLIANCE: keeps appointments   PHYSICAL EXAM:  BP 115/60 (BP Location: Left Arm, Patient Position: Sitting)   Pulse 88   Resp 19   Ht 5\' 11"  (1.803 m)   Wt 207 lb (93.9 kg)   SpO2 100%   BMI 28.87 kg/m  Deep palpation of his left ribs does not elicit pain deep palpation of the spine does not elicit pain well-developed well-nourished patient in NAD. HEENT reveals PERLA, EOMI, discs not visualized.  Oral cavity is clear. No oral mucosal lesions are identified. Neck is clear without evidence of cervical or supraclavicular adenopathy. Lungs are clear to A&P. Cardiac examination is essentially unremarkable with regular rate and rhythm without murmur rub or thrill. Abdomen is benign with no organomegaly or masses noted. Motor sensory and DTR levels are equal and symmetric in the upper and lower extremities. Cranial nerves II through XII are grossly intact. Proprioception is intact. No peripheral adenopathy or edema is identified. No motor or sensory levels are noted. Crude visual fields are  within normal range.  RADIOLOGY RESULTS: PET CT scan reviewed again with above-stated correction in the report.  PLAN: This time of getting plain films of his left ribs.  Do not see anything hypermetabolic in his left ribs.  Does have a some hypermetabolic lesions in his spine may be causing referred pain to that area.  He will proceed with needle biopsy and possible systemic treatment through medical oncology.  We will make further recommendations based on his plain films of his left ribs.  Patient and wife comprehend my recommendations well.  I did share his PET CT scan with them.  I would like to take this opportunity to thank you for allowing me to participate in the care of your patient.Noreene Filbert, MD

## 2022-05-27 ENCOUNTER — Other Ambulatory Visit (HOSPITAL_COMMUNITY): Payer: Self-pay | Admitting: Radiology

## 2022-05-27 ENCOUNTER — Ambulatory Visit: Payer: Medicare Other | Admitting: Urology

## 2022-05-27 DIAGNOSIS — D696 Thrombocytopenia, unspecified: Secondary | ICD-10-CM

## 2022-05-29 ENCOUNTER — Ambulatory Visit
Admission: RE | Admit: 2022-05-29 | Discharge: 2022-05-29 | Disposition: A | Discharge status: Home / Self Care | Payer: Medicare Other | Source: Ambulatory Visit | Attending: Oncology | Admitting: Oncology

## 2022-05-29 ENCOUNTER — Ambulatory Visit: Admission: RE | Admit: 2022-05-29 | Payer: Medicare Other | Source: Ambulatory Visit

## 2022-05-29 ENCOUNTER — Ambulatory Visit: Payer: Medicare Other

## 2022-05-29 ENCOUNTER — Other Ambulatory Visit: Payer: Medicare Other

## 2022-05-29 DIAGNOSIS — Z950 Presence of cardiac pacemaker: Secondary | ICD-10-CM | POA: Diagnosis not present

## 2022-05-29 DIAGNOSIS — R911 Solitary pulmonary nodule: Secondary | ICD-10-CM | POA: Insufficient documentation

## 2022-05-29 DIAGNOSIS — R591 Generalized enlarged lymph nodes: Secondary | ICD-10-CM | POA: Diagnosis not present

## 2022-05-29 DIAGNOSIS — J90 Pleural effusion, not elsewhere classified: Secondary | ICD-10-CM | POA: Insufficient documentation

## 2022-05-29 DIAGNOSIS — R590 Localized enlarged lymph nodes: Secondary | ICD-10-CM | POA: Diagnosis not present

## 2022-05-29 NOTE — Procedures (Signed)
Interventional Radiology Procedure Note  Procedure: US guided biopsy of left supraclavicular nodes Complications: None EBL: None Recommendations:   - Routine wound care - Follow up pathology - Advance diet   Signed,  Corrie Mckusick, DO

## 2022-05-29 NOTE — Progress Notes (Signed)
Chief Complaint: Presumed metastatic lung cancer  Referring Physician(s): Tasia Catchings  Supervising Physician: Corrie Mckusick  Patient Status: [facility] - Out-pt  History of Present Illness: Kyle Gutierrez is a 88 y.o. male  who was undergoing cardiac evaluation in July. He had a chest x-ray on 03/13/2022 which showed pulmonary nodules.  He then had a CT scan of the chest done 03/22/2022 which showed bilateral pulmonary nodules and mediastinal lymphadenopathy most consistent with neoplastic process.     On 04/10/2022 he underwent implantation of a  Micra Leadless pacemaker.  PET done 05/10/22 showed= Findings most compatible with stage IV primary bronchogenic carcinoma as evidenced by hypermetabolic bilateral pulmonary nodules, hypermetabolic lymph nodes in the low neck and chest and hypermetabolic bone lesions.  He is here today for image guided biopsy of the right supraclavicular lymph node.  No nausea/vomiting. No Fever/chills. ROS negative.   Past Medical History:  Diagnosis Date   Benign prostatic hypertrophy    s/p TURP   Cardiomyopathy ([place])    Carotid artery plaque    bilateral   CHF (congestive heart failure) (HCC)    Chronic atrial fibrillation (HCC)    Complication of anesthesia    balance problem   Coronary artery disease, non-occlusive    Dyspnea    Dysrhythmia    atrial fib   Elevated lipids    Gross hematuria    Hypertension    Neuropathy, alcoholic ([place])    per Neurologic eval, remote   Nocturia    Poor balance    renal cell carcinoma 2002   left kidney cancer with partial nephrectomy.   Retinopathy    followed by [facility]   Sleep apnea    Thrombocytopenia, unspecified (HCC)    Urethral stricture    Urinary frequency    Valvular cardiomyopathy (HCC)    severe mitral and tricuspid insufficiency, ECHO July 2012    Past Surgical History:  Procedure Laterality Date   CYSTOSCOPY W/ RETROGRADES N/A 05/06/2013   Procedure: CYSTOSCOPY WITH RETROGRADE  PYELOGRAM,  BALLOON DILATION OF URETHRAL STRICTURE;  Surgeon: Dutch Gray, MD;  Location: [facility];  Service: Urology;  Laterality: N/A;   EYE SURGERY     bilateral cataracts removed   left rotator cuff repair     PACEMAKER LEADLESS INSERTION N/A 04/09/2022   Procedure: PACEMAKER LEADLESS INSERTION;  Surgeon: Isaias Cowman, MD;  Location: [place] [facility];  Service: Cardiovascular;  Laterality: N/A;   PARTIAL NEPHRECTOMY Left    10'02   PROSTATE SURGERY     ROTATOR CUFF REPAIR Right    SHOULDER ARTHROSCOPY WITH OPEN ROTATOR CUFF REPAIR Left 07/02/2018   Procedure: SHOULDER ARTHROSCOPY WITH MINI OPEN ROTATOR CUFF REPAIR INCLUDING SUBSCAPULARIS ,SUBACROMINAL DECOMPRESSION.;  Surgeon: Thornton Park, MD;  Location: [facility];  Service: Orthopedics;  Laterality: Left;   TOTAL HIP ARTHROPLASTY Right 06/19/2017   Procedure: TOTAL HIP ARTHROPLASTY ANTERIOR APPROACH;  Surgeon: Hessie Knows, MD;  Location: [facility];  Service: Orthopedics;  Laterality: Right;   TRANSURETHRAL RESECTION OF PROSTATE  2003    Allergies: Ace inhibitors, Latex, Tape, and Tapentadol  Medications: Prior to Admission medications   Medication Sig Start Date End Date Taking? Authorizing Provider  acetaminophen (TYLENOL) 500 MG tablet Take 1,000 mg by mouth 2 (two) times daily.     [provider]  fluticasone (FLONASE) 50 MCG/ACT nasal spray Place into the nose.    [provider]  furosemide (LASIX) 40 MG tablet Take 40 mg by mouth daily. 10/15/21   [provider]  losartan (COZAAR) 100 MG tablet Take 100 mg by mouth daily.    [provider]  metoprolol succinate (TOPROL-XL) 25 MG 24 hr tablet Take 12.5 mg by mouth daily.     [provider]  Multiple Vitamins-Minerals (PRESERVISION AREDS 2 PO) Take 1 capsule by mouth 2 (two) times daily.    [provider]  simvastatin (ZOCOR) 40 MG tablet Take 1 tablet (40 mg total) by mouth every morning. 08/13/13   Crecencio Mc, MD  spironolactone (ALDACTONE) 25 MG tablet Take 1 tablet by mouth daily. 05/09/20   [provider]  traMADol (ULTRAM) 50 MG tablet Take 1 tablet (50 mg total) by mouth every 6 (six) hours as needed. 05/20/22   Earlie Server, MD  warfarin (COUMADIN) 2 MG tablet Take 2 mg by mouth as directed. Take 3 mg (1 and 1/2 tablets) Sunday, Monday, Wednesday, and Friday and 2 mg (1 tablet) on Tuesday, Thursday, and Saturday 11/09/21   [provider]     Family History  Problem Relation Age of Onset   Heart disease Mother 70       heart failure   Early death Father 56   Heart disease Father        CAD   Cancer Sister        Breast    Social History   Socioeconomic History   Marital status: Married    Spouse name: Not on file   Number of children: Not on file   Years of education: Not on file   Highest education level: Not on file  Occupational History   Not on file  Tobacco Use   Smoking status: Former    Types: Cigarettes    Quit date: 03/29/1977    Years since quitting: 45.1   Smokeless tobacco: Never  Vaping Use   Vaping Use: Never used  Substance and Sexual Activity   Alcohol use: Yes    Comment: daily 5-6 ounces wine or whiskey    Drug use: No   Sexual activity: Yes  Other Topics Concern   Not on file  Social History Narrative   Not on file   Social Determinants of Health   Financial Resource Strain: Not on file  Food Insecurity: Not on file  Transportation Needs: Not on file  Physical Activity: Not on file  Stress: Not on file  Social Connections: Not on file     Review of Systems: A 12 point ROS discussed and pertinent positives are indicated in the HPI above.  All other systems are negative.  Review of Systems  Vital Signs: There were no vitals taken for this visit.  Physical Exam Constitutional:      Appearance: Normal appearance.  HENT:     Head: Normocephalic and atraumatic.  Eyes:     Extraocular Movements: Extraocular movements  intact.  Pulmonary:     Effort: Pulmonary effort is normal. No respiratory distress.  Musculoskeletal:        General: Normal range of motion.     Cervical back: Normal range of motion.  Neurological:     General: No focal deficit present.     Mental Status: He is alert and oriented to person, place, and time.  Psychiatric:        Mood and Affect: Mood normal.        Behavior: Behavior normal.        Thought Content: Thought content normal.  Judgment: Judgment normal.     Imaging: DG Chest 1 View  Result Date: 05/24/2022 CLINICAL DATA:  Left rib pain. EXAM: CHEST  1 VIEW COMPARISON:  Chest radiograph December 22, 2021; PET-CT May 08, 2022 FINDINGS: Cardiomegaly. Aortic atherosclerosis. Redemonstrated multiple bilateral pulmonary nodules throughout the left and right lungs most pronounced within the left lung apex measuring up to 3 cm. Bilateral interstitial pulmonary opacities. Small bilateral pleural effusions. Thoracic spine degenerative changes. IMPRESSION: Bilateral pulmonary nodules as recently evaluated on PET-CT compatible with history of metastatic bronchogenic carcinoma. Cardiomegaly. Mild interstitial opacities suggestive of edema with associated small pleural effusions. Electronically Signed   By: Lovey Newcomer M.D.   On: 05/24/2022 06:18   NM PET Image Initial (PI) Skull Base To Thigh (F-18 FDG)  Result Date: 05/10/2022 CLINICAL DATA:  Initial treatment strategy for lung nodules. EXAM: NUCLEAR MEDICINE PET SKULL BASE TO THIGH TECHNIQUE: 10.5 mCi F-18 FDG was injected intravenously. Full-ring PET imaging was performed from the skull base to thigh after the radiotracer. CT data was obtained and used for attenuation correction and anatomic localization. Fasting blood glucose: 83 mg/dl COMPARISON:  CT chest 03/22/2022 and CT abdomen pelvis 12/22/2021. FINDINGS: Mediastinal blood pool activity: SUV max 1.5 Liver activity: SUV max NA NECK: Left level 2 lymph nodes measure up to 6  mm (2/47), SUV max 1.8. Hypermetabolic left level 5 lymph nodes with index 6 mm lymph node (2/56), SUV max 2.2. Incidental CT findings: None. CHEST: Hypermetabolic supraclavicular mediastinal and left hilar adenopathy. Index low right paratracheal lymph node measures 1.3 cm (2/84), SUV max 6.4. Hypermetabolic bilateral pulmonary nodules. Index spiculated nodule in the apical left upper lobe measures 2.5 x 3.1 cm (2/73) and has enlarged minimally from 03/22/2022 with SUV max 10.4. Incidental CT findings: Atherosclerotic calcification of the aorta and coronary arteries. Heart is enlarged. No pericardial effusion. Small left pleural effusion. Centrilobular emphysema. ABDOMEN/PELVIS: No abnormal hypermetabolism in the liver, adrenal glands, spleen or pancreas. No hypermetabolic lymph nodes. Incidental CT findings: Liver margin is irregular. Low-attenuation lesions in the liver measure up to 3.8 cm in the right hepatic lobe, likely cysts. Liver, gallbladder, adrenal glands otherwise unremarkable. Subcentimeter lesions in the kidneys, too small to characterize. No specific follow-up necessary. Spleen, pancreas, stomach and bowel are grossly unremarkable. Moderate ascites. SKELETON: Scattered hypermetabolic osseous lesions. Index lytic lesion in the anterior left sixth rib has an SUV max 4.7. Incidental CT findings: Right hip arthroplasty.  Degenerative changes in the spine IMPRESSION: 1. Findings most compatible with stage IV primary bronchogenic carcinoma as evidenced by hypermetabolic bilateral pulmonary nodules, hypermetabolic lymph nodes in the low neck and chest and hypermetabolic bone lesions. 2. Cirrhosis with moderate ascites. 3. Small left pleural effusion. 4. Aortic atherosclerosis (ICD10-I70.0). Coronary artery calcification. 5.  Emphysema (ICD10-J43.9). Electronically Signed   By: Lorin Picket M.D.   On: 05/10/2022 08:26    Labs:  CBC: Recent Labs    12/22/21 0206 12/23/21 0821 05/17/22 0950  WBC  13.3* 10.3 7.7  HGB 13.1 12.3* 12.4*  HCT 42.2 38.7* 39.1  PLT 83* 82* 129*    COAGS: Recent Labs    12/22/21 0206 12/23/21 0420 12/24/21 0420  INR 1.9* 2.2* 2.0*    BMP: Recent Labs    12/22/21 0206 12/23/21 0420 12/23/21 0821 04/10/22 0519 05/17/22 0950  NA 138  --  136 138 140  K 4.4  --  3.9 4.6 4.2  CL 106  --  110 110 108  CO2 24  --  21* 24 25  GLUCOSE 107*  --  112* 94 121*  BUN 45*  --  36* 42* 43*  CALCIUM 9.7  --  8.6* 8.8* 9.5  CREATININE 1.47* 1.39* 1.38* 1.46* 1.38*  GFRNONAA 46* 49* 49* 46* 49*    LIVER FUNCTION TESTS: Recent Labs    12/22/21 0206 05/17/22 0950  BILITOT 2.1* 1.1  AST 32 24  ALT 33 25  ALKPHOS 131* 191*  PROT 7.1 6.9  ALBUMIN 4.2 3.8    TUMOR MARKERS: No results for input(s): "AFPTM", "CEA", "CA199", "CHROMGRNA" in the last 8760 hours.  Assessment and Plan:  Presumed metastatic lung cancer  Will proceed with image guided biopsy of the left supraclavicular lymph node today by Dr. Earleen Newport.  Risks and benefits of lymph node biopsy was discussed with the patient and/or patient's family including, but not limited to bleeding, infection, damage to adjacent structures or low yield requiring additional tests.  All of the questions were answered and there is agreement to proceed.  Consent signed and in chart.   Thank you for allowing our service to participate in Kyle Gutierrez 's care.  Electronically Signed: Murrell Redden, PA-C   05/29/2022, 12:59 PM      I spent a total of  15 Minutes  in face to face in clinical consultation, greater than 50% of which was counseling/coordinating care for lymph node biopsy.

## 2022-05-31 ENCOUNTER — Other Ambulatory Visit: Payer: Self-pay | Admitting: Pathology

## 2022-05-31 LAB — SURGICAL PATHOLOGY

## 2022-06-02 ENCOUNTER — Encounter: Payer: Self-pay | Admitting: *Deleted

## 2022-06-04 ENCOUNTER — Encounter: Payer: Self-pay | Admitting: *Deleted

## 2022-06-04 ENCOUNTER — Other Ambulatory Visit: Payer: Self-pay

## 2022-06-05 ENCOUNTER — Inpatient Hospital Stay: Payer: Medicare Other | Attending: Oncology | Admitting: Hospice and Palliative Medicine

## 2022-06-05 DIAGNOSIS — Z950 Presence of cardiac pacemaker: Secondary | ICD-10-CM | POA: Insufficient documentation

## 2022-06-05 DIAGNOSIS — R911 Solitary pulmonary nodule: Secondary | ICD-10-CM

## 2022-06-05 DIAGNOSIS — R188 Other ascites: Secondary | ICD-10-CM | POA: Insufficient documentation

## 2022-06-05 DIAGNOSIS — D631 Anemia in chronic kidney disease: Secondary | ICD-10-CM | POA: Insufficient documentation

## 2022-06-05 DIAGNOSIS — Z79899 Other long term (current) drug therapy: Secondary | ICD-10-CM | POA: Insufficient documentation

## 2022-06-05 DIAGNOSIS — Z87891 Personal history of nicotine dependence: Secondary | ICD-10-CM | POA: Insufficient documentation

## 2022-06-05 DIAGNOSIS — F40240 Claustrophobia: Secondary | ICD-10-CM | POA: Insufficient documentation

## 2022-06-05 DIAGNOSIS — N189 Chronic kidney disease, unspecified: Secondary | ICD-10-CM | POA: Insufficient documentation

## 2022-06-05 DIAGNOSIS — R948 Abnormal results of function studies of other organs and systems: Secondary | ICD-10-CM | POA: Insufficient documentation

## 2022-06-05 DIAGNOSIS — C3490 Malignant neoplasm of unspecified part of unspecified bronchus or lung: Secondary | ICD-10-CM | POA: Insufficient documentation

## 2022-06-05 DIAGNOSIS — G893 Neoplasm related pain (acute) (chronic): Secondary | ICD-10-CM | POA: Insufficient documentation

## 2022-06-05 DIAGNOSIS — C770 Secondary and unspecified malignant neoplasm of lymph nodes of head, face and neck: Secondary | ICD-10-CM | POA: Insufficient documentation

## 2022-06-05 DIAGNOSIS — C7951 Secondary malignant neoplasm of bone: Secondary | ICD-10-CM | POA: Insufficient documentation

## 2022-06-05 DIAGNOSIS — R0781 Pleurodynia: Secondary | ICD-10-CM | POA: Insufficient documentation

## 2022-06-05 NOTE — Progress Notes (Signed)
Multidisciplinary Oncology Council Documentation  Jayvon Mounger was presented by our [facility] on 06/05/2022, which included representatives from:  Palliative Care Dietitian  Physical/Occupational Therapist Nurse Navigator Genetics Speech Therapist Social work Survivorship RN Financial Navigator Research RN   Phyllip currently presents with history of lung cancer  We reviewed previous medical and familial history, history of present illness, and recent lab results along with all available histopathologic and imaging studies. The [facility] considered available treatment options and made the following recommendations/referrals:  Nutrition, recommend palliative care  The MOC is a meeting of clinicians from various specialty areas who evaluate and discuss patients for whom a multidisciplinary approach is being considered. Final determinations in the plan of care are those of the provider(s).   Today's extended care, comprehensive team conference, Roger was not present for the discussion and was not examined.

## 2022-06-06 ENCOUNTER — Inpatient Hospital Stay (HOSPITAL_BASED_OUTPATIENT_CLINIC_OR_DEPARTMENT_OTHER): Payer: Medicare Other | Admitting: Oncology

## 2022-06-06 ENCOUNTER — Other Ambulatory Visit: Payer: Medicare Other

## 2022-06-06 ENCOUNTER — Encounter: Payer: Self-pay | Admitting: *Deleted

## 2022-06-06 ENCOUNTER — Encounter: Payer: Self-pay | Admitting: Oncology

## 2022-06-06 VITALS — BP 120/83 | HR 90 | Temp 98.3°F | Resp 20 | Wt 198.2 lb

## 2022-06-06 DIAGNOSIS — R948 Abnormal results of function studies of other organs and systems: Secondary | ICD-10-CM | POA: Diagnosis not present

## 2022-06-06 DIAGNOSIS — R748 Abnormal levels of other serum enzymes: Secondary | ICD-10-CM

## 2022-06-06 DIAGNOSIS — N189 Chronic kidney disease, unspecified: Secondary | ICD-10-CM | POA: Diagnosis not present

## 2022-06-06 DIAGNOSIS — C7951 Secondary malignant neoplasm of bone: Secondary | ICD-10-CM | POA: Diagnosis not present

## 2022-06-06 DIAGNOSIS — C3490 Malignant neoplasm of unspecified part of unspecified bronchus or lung: Secondary | ICD-10-CM

## 2022-06-06 DIAGNOSIS — Z7189 Other specified counseling: Secondary | ICD-10-CM | POA: Diagnosis not present

## 2022-06-06 DIAGNOSIS — D631 Anemia in chronic kidney disease: Secondary | ICD-10-CM | POA: Diagnosis not present

## 2022-06-06 DIAGNOSIS — Z87891 Personal history of nicotine dependence: Secondary | ICD-10-CM | POA: Diagnosis not present

## 2022-06-06 DIAGNOSIS — Z950 Presence of cardiac pacemaker: Secondary | ICD-10-CM | POA: Diagnosis not present

## 2022-06-06 DIAGNOSIS — Z79899 Other long term (current) drug therapy: Secondary | ICD-10-CM | POA: Diagnosis not present

## 2022-06-06 DIAGNOSIS — R180 Malignant ascites: Secondary | ICD-10-CM

## 2022-06-06 DIAGNOSIS — F40240 Claustrophobia: Secondary | ICD-10-CM | POA: Diagnosis not present

## 2022-06-06 DIAGNOSIS — R188 Other ascites: Secondary | ICD-10-CM | POA: Insufficient documentation

## 2022-06-06 DIAGNOSIS — C770 Secondary and unspecified malignant neoplasm of lymph nodes of head, face and neck: Secondary | ICD-10-CM | POA: Diagnosis not present

## 2022-06-06 DIAGNOSIS — R0781 Pleurodynia: Secondary | ICD-10-CM | POA: Diagnosis not present

## 2022-06-06 DIAGNOSIS — G893 Neoplasm related pain (acute) (chronic): Secondary | ICD-10-CM | POA: Diagnosis not present

## 2022-06-06 NOTE — Assessment & Plan Note (Signed)
Discussed with the patient. He understands that his condition is not curable and treatment is with palliative intent.

## 2022-06-06 NOTE — Progress Notes (Signed)
Hematology/Oncology Consult Note [phone] [phone]     REFERRING PROVIDER: Dion Body, MD   Patient Care Team: Dion Body, MD as PCP - General (Family Medicine) Telford Nab, RN as Oncology Nurse Navigator    ASSESSMENT & PLAN:   Primary lung adenocarcinoma [facility]) MRI brain with and without contrast to complete staging. PET scan imaging and biopsy pathology result reviewed and discussed with patient. I discussed with patient about the diagnosis of stage IV lung adenocarcinoma. He understands that his condition is not curable. Recommend to send NGS.  If patient does not have palpable mutation, with PD-L1>= 50%, may consider immunotherapy. If PD-L1 <50%, immunotherapy monotherapy may not be effective. Patient has multiple comorbidities and I do not feel he is a candidate for aggressive chemotherapy. Patient is undecided and will update me if he desires to proceed with treatment.  Refer to palliative care   Goals of care, counseling/discussion Discussed with the patient. He understands that his condition is not curable and treatment is with palliative intent.  Ascites Likely secondary to liver cirrhosis, +/- malignant involvement. Ascites may contribute to his chronic SOB Recommend palliative paracentesis, will submit fluid studies.  Anemia in chronic kidney disease (CKD) Likely due to chronic kidney disease.  Monitor.  Elevated alkaline phosphatase level Likely due to liver cirrhosis/bone metastasis.  Orders Placed This Encounter  Procedures   US Paracentesis    Standing Status:   Future    Standing Expiration Date:   06/07/2023    Order Specific Question:   If therapeutic, is there a maximum amount of fluid to be removed?    Answer:   Yes    Order Specific Question:   What is the maximum amount of fluide to be removed?    Answer:   4L    Order Specific Question:   Are labs required for specimen collection?    Answer:    Yes    Order Specific Question:   Lab orders requested (DO NOT place separate lab orders, these will be automatically ordered during procedure specimen collection):    Answer:   Protein, Body Fluid    Order Specific Question:   Lab orders requested (DO NOT place separate lab orders, these will be automatically ordered during procedure specimen collection):    Answer:   Cytology - Non Pap    Order Specific Question:   Lab orders requested (DO NOT place separate lab orders, these will be automatically ordered during procedure specimen collection):    Answer:   Lactate Dehydrogenase, Body Fluid    Order Specific Question:   Lab orders requested (DO NOT place separate lab orders, these will be automatically ordered during procedure specimen collection):    Answer:   Albumin, Body Fluid    Order Specific Question:   Is Albumin medication needed?    Answer:   No    Order Specific Question:   Reason for Exam (SYMPTOM  OR DIAGNOSIS REQUIRED)    Answer:   ascites    Order Specific Question:   Preferred imaging location?    Answer:   [place]   Ambulatory Referral to Palliative Care    Referral Priority:   Routine    Referral Type:   Consultation    Referral Reason:   Goals of Care    Number of Visits Requested:   1   Follow up TBD All questions were answered. The patient knows to call the clinic with any problems, questions or concerns.  Earlie Server, MD,  PhD [facility] Hematology Oncology 06/06/2022   CHIEF COMPLAINTS/PURPOSE OF CONSULTATION:  Lung nodule, lymphadenopathy  HISTORY OF PRESENTING ILLNESS:  Kyle Gutierrez 88 y.o. male presents to establish care for Stage IV lung cancer treatment Oncology History  Primary lung adenocarcinoma ([place])  03/22/2022 Imaging   CT chest without contrast showed bilateral pulmonary nodules and mediastinal lymphadenopathy most consistent with neoplastic process.  Primary lesion is not definitively identified on this study.  Small volume on upper  abdominal ascites.  Low attenuating liver with an irregular border worrisome for cirrhosis.  Marked cardiomegaly, coronary artery disease.  Aortic atherosclerosis, emphysema.   04/10/2022 Imaging   patient underwent Micra Leadless pacemaker implant    05/10/2022 Imaging    PET scan showed  Hypermetabolic lower neck lymphadenopathy- Left level 2 lymph nodes measure up to 6 mm (2/47), SUV max 1.8. Hypermetabolic left level 5 lymph nodes with index 6 mm lymph node (2/56), SUV max 2.2.  hypermetabolic supraclavicle mediastinal and left hilar adenopathy,Index low right paratracheal lymph node measures 1.3 cm (2/84), SUV max 6.4.  Hypermetabolic bilateral pulmonary nodules.Index spiculated nodule in the apical left upper lobe measures 2.5 x 3.1 cm (2/73) and has enlarged minimally from 03/22/2022 with SUV max 10.4. Scattered hypermetabolic osseous lesions, index lytic lesion in the chest which showed anterior left sixth rib has an SUV max 4.7.     05/10/2022 Imaging   PET scan showed 1. Findings most compatible with stage IV primary bronchogenic carcinoma as evidenced by hypermetabolic bilateral pulmonary nodules, hypermetabolic lymph nodes in the low neck and chest and hypermetabolic bone lesions. 2. Cirrhosis with moderate ascites.3. Small left pleural effusion. 4. Aortic atherosclerosis (ICD10-I70.0). Coronary artery calcification. 5.  Emphysema (ICD10-J43.9).   05/29/2022 Procedure   Biopsy of left supraclavicular adenopathy   05/29/2022 Initial Diagnosis   Primary lung adenocarcinoma ([place])  05/29/22 Left supraclavicular adenopathy biopsy showed metastatic adenocarcinoma of lung.    06/06/2022 Cancer Staging   Staging form: Lung, AJCC 8th Edition - Clinical stage from 06/06/2022: Stage IV (cT2, cN3, cM1) - Signed by Earlie Server, MD on 06/06/2022 Stage prefix: Initial diagnosis     Patient has atrial fibrillation and is on anticoagulation with Coumadin.  Former smoker, quitting many years  ago.   INTERVAL HISTORY Kyle Gutierrez is a 88 y.o. male who has above history reviewed by me today presents for follow up visit for Stage IV lung cancer treatment.  He reports worse SOB, he has appt with pulmonology.  +Abdomen distention.    MEDICAL HISTORY:  Past Medical History:  Diagnosis Date   Benign prostatic hypertrophy    s/p TURP   Cardiomyopathy ([place])    Carotid artery plaque    bilateral   CHF (congestive heart failure) (HCC)    Chronic atrial fibrillation (HCC)    Complication of anesthesia    balance problem   Coronary artery disease, non-occlusive    Dyspnea    Dysrhythmia    atrial fib   Elevated lipids    Gross hematuria    Hypertension    Neuropathy, alcoholic ([place])    per Neurologic eval, remote   Nocturia    Poor balance    renal cell carcinoma 2002   left kidney cancer with partial nephrectomy.   Retinopathy    followed by [facility]   Sleep apnea    Thrombocytopenia, unspecified (HCC)    Urethral stricture    Urinary frequency    Valvular cardiomyopathy (HCC)    severe mitral and tricuspid insufficiency, ECHO  July 2012    SURGICAL HISTORY: Past Surgical History:  Procedure Laterality Date   CYSTOSCOPY W/ RETROGRADES N/A 05/06/2013   Procedure: CYSTOSCOPY WITH RETROGRADE PYELOGRAM,  BALLOON DILATION OF URETHRAL STRICTURE;  Surgeon: Crecencio Mc, MD;  Location: [facility] ORS;  Service: Urology;  Laterality: N/A;   EYE SURGERY     bilateral cataracts removed   left rotator cuff repair     PACEMAKER LEADLESS INSERTION N/A 04/09/2022   Procedure: PACEMAKER LEADLESS INSERTION;  Surgeon: Marcina Millard, MD;  Location: [facility] INVASIVE CV LAB;  Service: Cardiovascular;  Laterality: N/A;   PARTIAL NEPHRECTOMY Left    10'02   PROSTATE SURGERY     ROTATOR CUFF REPAIR Right    SHOULDER ARTHROSCOPY WITH OPEN ROTATOR CUFF REPAIR Left 07/02/2018   Procedure: SHOULDER ARTHROSCOPY WITH MINI OPEN ROTATOR CUFF REPAIR INCLUDING SUBSCAPULARIS ,SUBACROMINAL  DECOMPRESSION.;  Surgeon: Juanell Fairly, MD;  Location: [facility] ORS;  Service: Orthopedics;  Laterality: Left;   TOTAL HIP ARTHROPLASTY Right 06/19/2017   Procedure: TOTAL HIP ARTHROPLASTY ANTERIOR APPROACH;  Surgeon: Kennedy Bucker, MD;  Location: [facility] ORS;  Service: Orthopedics;  Laterality: Right;   TRANSURETHRAL RESECTION OF PROSTATE  2003    SOCIAL HISTORY: Social History   Socioeconomic History   Marital status: Married    Spouse name: Not on file   Number of children: Not on file   Years of education: Not on file   Highest education level: Not on file  Occupational History   Not on file  Tobacco Use   Smoking status: Former    Types: Cigarettes    Quit date: 03/29/1977    Years since quitting: 45.2   Smokeless tobacco: Never  Vaping Use   Vaping Use: Never used  Substance and Sexual Activity   Alcohol use: Yes    Comment: daily 5-6 ounces wine or whiskey    Drug use: No   Sexual activity: Yes  Other Topics Concern   Not on file  Social History Narrative   Not on file   Social Determinants of Health   Financial Resource Strain: Not on file  Food Insecurity: Not on file  Transportation Needs: Not on file  Physical Activity: Not on file  Stress: Not on file  Social Connections: Not on file  Intimate Partner Violence: Not on file    FAMILY HISTORY: Family History  Problem Relation Age of Onset   Heart disease Mother 15       heart failure   Early death Father 33   Heart disease Father        CAD   Cancer Sister        Breast    ALLERGIES:  is allergic to ace inhibitors, latex, tape, and tapentadol.  MEDICATIONS:  Current Outpatient Medications  Medication Sig Dispense Refill   acetaminophen (TYLENOL) 500 MG tablet Take 1,000 mg by mouth 2 (two) times daily.      furosemide (LASIX) 40 MG tablet Take 40 mg by mouth daily.     losartan (COZAAR) 100 MG tablet Take 100 mg by mouth daily.     metoprolol succinate (TOPROL-XL) 25 MG 24 hr tablet Take 12.5 mg  by mouth daily.      Multiple Vitamins-Minerals (PRESERVISION AREDS 2 PO) Take 1 capsule by mouth 2 (two) times daily.     simvastatin (ZOCOR) 40 MG tablet Take 1 tablet (40 mg total) by mouth every morning. 90 tablet 2   spironolactone (ALDACTONE) 25 MG tablet Take 1 tablet by mouth daily.  traMADol (ULTRAM) 50 MG tablet Take 1 tablet (50 mg total) by mouth every 6 (six) hours as needed. 30 tablet 0   warfarin (COUMADIN) 2 MG tablet Take 2 mg by mouth as directed. Take 3 mg (1 and 1/2 tablets) Sunday, Monday, Wednesday, and Friday and 2 mg (1 tablet) on Tuesday, Thursday, and Saturday     No current facility-administered medications for this visit.    Review of Systems  Constitutional:  Negative for fatigue.  HENT:   Positive for hearing loss.   Eyes:  Negative for icterus.  Respiratory:  Positive for shortness of breath. Negative for chest tightness and cough.   Cardiovascular:  Negative for chest pain.  Gastrointestinal:  Positive for abdominal distention. Negative for abdominal pain.  Musculoskeletal:  Negative for back pain.  Skin:  Negative for rash.  Neurological:  Negative for speech difficulty.  Hematological:  Negative for adenopathy.  Psychiatric/Behavioral:  Negative for confusion.      PHYSICAL EXAMINATION: ECOG PERFORMANCE STATUS: 1 - Symptomatic but completely ambulatory  Vitals:   06/06/22 1022  BP: 120/83  Pulse: 90  Resp: 20  Temp: 98.3 F (36.8 C)  SpO2: 93%   Filed Weights   06/06/22 1022  Weight: 198 lb 3.2 oz (89.9 kg)    Physical Exam Constitutional:      General: He is not in acute distress.    Appearance: He is not diaphoretic.     Comments: Ambulate independently.  HENT:     Head: Normocephalic and atraumatic.     Mouth/Throat:     Pharynx: No oropharyngeal exudate.  Eyes:     General: No scleral icterus.    Pupils: Pupils are equal, round, and reactive to light.  Cardiovascular:     Rate and Rhythm: Normal rate.  Pulmonary:      Effort: Pulmonary effort is normal. No respiratory distress.     Breath sounds: No wheezing.     Comments: Decreased breath sounds bilaterally Abdominal:     General: There is distension.     Palpations: Abdomen is soft.     Tenderness: There is no abdominal tenderness.  Musculoskeletal:        General: No tenderness.     Cervical back: Normal range of motion and neck supple.  Skin:    General: Skin is warm and dry.     Findings: No erythema.  Neurological:     Mental Status: He is alert and oriented to person, place, and time. Mental status is at baseline.     Cranial Nerves: No cranial nerve deficit.     Motor: No abnormal muscle tone.  Psychiatric:        Mood and Affect: Mood and affect normal.      LABORATORY DATA:  I have reviewed the data as listed    Latest Ref Rng & Units 05/17/2022    9:50 AM 12/23/2021    8:21 AM 12/22/2021    2:06 AM  CBC  WBC 4.0 - 10.5 K/uL 7.7  10.3  13.3   Hemoglobin 13.0 - 17.0 g/dL 93.1  93.6  58.3   Hematocrit 39.0 - 52.0 % 39.1  38.7  42.2   Platelets 150 - 400 K/uL 129  82  83       Latest Ref Rng & Units 05/17/2022    9:50 AM 04/10/2022    5:19 AM 12/23/2021    8:21 AM  CMP  Glucose 70 - 99 mg/dL 649  94  402  BUN 8 - 23 mg/dL 43  42  36   Creatinine 0.61 - 1.24 mg/dL 1.38  1.46  1.38   Sodium 135 - 145 mmol/L 140  138  136   Potassium 3.5 - 5.1 mmol/L 4.2  4.6  3.9   Chloride 98 - 111 mmol/L 108  110  110   CO2 22 - 32 mmol/L $RemoveB'25  24  21   'ldWsvXAT$ Calcium 8.9 - 10.3 mg/dL 9.5  8.8  8.6   Total Protein 6.5 - 8.1 g/dL 6.9     Total Bilirubin 0.3 - 1.2 mg/dL 1.1     Alkaline Phos 38 - 126 U/L 191     AST 15 - 41 U/L 24     ALT 0 - 44 U/L 25        RADIOGRAPHIC STUDIES: I have personally reviewed the radiological images as listed and agreed with the findings in the report. [place] CORE BIOPSY (LYMPH NODES)  Result Date: 05/29/2022 INDICATION: 88 year old male with a history suspected metastatic lung carcinoma referred for diagnostic biopsy  supraclavicular lymph nodes EXAM: ULTRASOUND-GUIDED BIOPSY LEFT SUPRACLAVICULAR LYMPH NODES MEDICATIONS: None. ANESTHESIA/SEDATION: None COMPLICATIONS: None PROCEDURE: Informed written consent was obtained from the patient after a thorough discussion of the procedural risks, benefits and alternatives. All questions were addressed. Maximal Sterile Barrier Technique was utilized including caps, mask, sterile gowns, sterile gloves, sterile drape, hand hygiene and skin antiseptic. A timeout was performed prior to the initiation of the procedure. Ultrasound survey was performed with images stored and sent to PACs. The left neck was prepped with chlorhexidine in a sterile fashion, and a sterile drape was applied covering the operative field. A sterile gown and sterile gloves were used for the procedure. Local anesthesia was provided with 1% Lidocaine. Ultrasound guidance was used to infiltrate the region with 1% lidocaine for local anesthesia. Multiple 18 gauge core biopsy were then acquired of left supraclavicular pathologic adenopathy using ultrasound guidance. Images were stored. Specimen placed into fresh specimen for transport to the lab. Final image was stored after biopsy. Patient tolerated the procedure well and remained hemodynamically stable throughout. No complications were encountered and no significant blood loss was encounter IMPRESSION: Status post ultrasound-guided biopsy pathologic left supraclavicular adenopathy Signed, Dulcy Fanny. Nadene Rubins, RPVI [facility] Electronically Signed   By: Corrie Mckusick D.O.   On: 05/29/2022 14:16   DG Chest 1 View  Result Date: 05/24/2022 CLINICAL DATA:  Left rib pain. EXAM: CHEST  1 VIEW COMPARISON:  Chest radiograph December 22, 2021; PET-CT May 08, 2022 FINDINGS: Cardiomegaly. Aortic atherosclerosis. Redemonstrated multiple bilateral pulmonary nodules throughout the left and right lungs most pronounced  within the left lung apex measuring up to 3 cm. Bilateral interstitial pulmonary opacities. Small bilateral pleural effusions. Thoracic spine degenerative changes. IMPRESSION: Bilateral pulmonary nodules as recently evaluated on PET-CT compatible with history of metastatic bronchogenic carcinoma. Cardiomegaly. Mild interstitial opacities suggestive of edema with associated small pleural effusions. Electronically Signed   By: Lovey Newcomer M.D.   On: 05/24/2022 06:18   NM PET Image Initial (PI) Skull Base To Thigh (F-18 FDG)  Result Date: 05/10/2022 CLINICAL DATA:  Initial treatment strategy for lung nodules. EXAM: NUCLEAR MEDICINE PET SKULL BASE TO THIGH TECHNIQUE: 10.5 mCi F-18 FDG was injected intravenously. Full-ring PET imaging was performed from the skull base to thigh after the radiotracer. CT data was obtained and used for attenuation correction and anatomic localization. Fasting blood glucose: 83 mg/dl COMPARISON:  CT chest 03/22/2022 and CT abdomen pelvis 12/22/2021. FINDINGS: Mediastinal blood pool activity: SUV max 1.5 Liver activity: SUV max NA NECK: Left level 2 lymph nodes measure up to 6 mm (2/47), SUV max 1.8. Hypermetabolic left level 5 lymph nodes with index 6 mm lymph node (2/56), SUV max 2.2. Incidental CT findings: None. CHEST: Hypermetabolic supraclavicular mediastinal and left hilar adenopathy. Index low right paratracheal lymph node measures 1.3 cm (2/84), SUV max 6.4. Hypermetabolic bilateral pulmonary nodules. Index spiculated nodule in the apical left upper lobe measures 2.5 x 3.1 cm (2/73) and has enlarged minimally from 03/22/2022 with SUV max 10.4. Incidental CT findings: Atherosclerotic calcification of the aorta and coronary arteries. Heart is enlarged. No pericardial effusion. Small left pleural effusion. Centrilobular emphysema. ABDOMEN/PELVIS: No abnormal hypermetabolism in the liver, adrenal glands, spleen or pancreas. No hypermetabolic lymph nodes. Incidental CT findings: Liver  margin is irregular. Low-attenuation lesions in the liver measure up to 3.8 cm in the right hepatic lobe, likely cysts. Liver, gallbladder, adrenal glands otherwise unremarkable. Subcentimeter lesions in the kidneys, too small to characterize. No specific follow-up necessary. Spleen, pancreas, stomach and bowel are grossly unremarkable. Moderate ascites. SKELETON: Scattered hypermetabolic osseous lesions. Index lytic lesion in the anterior left sixth rib has an SUV max 4.7. Incidental CT findings: Right hip arthroplasty.  Degenerative changes in the spine IMPRESSION: 1. Findings most compatible with stage IV primary bronchogenic carcinoma as evidenced by hypermetabolic bilateral pulmonary nodules, hypermetabolic lymph nodes in the low neck and chest and hypermetabolic bone lesions. 2. Cirrhosis with moderate ascites. 3. Small left pleural effusion. 4. Aortic atherosclerosis (ICD10-I70.0). Coronary artery calcification. 5.  Emphysema (ICD10-J43.9). Electronically Signed   By: Lorin Picket M.D.   On: 05/10/2022 08:26

## 2022-06-06 NOTE — Progress Notes (Signed)
Patient here for follow up. Pt reports worsening shortness of breath.

## 2022-06-06 NOTE — Assessment & Plan Note (Signed)
Likely due to liver cirrhosis/bone metastasis.

## 2022-06-06 NOTE — Assessment & Plan Note (Signed)
Likely due to chronic kidney disease.  Monitor.

## 2022-06-06 NOTE — Progress Notes (Signed)
Met with patient and his wife during follow up visit with Dr. Tasia Catchings to discuss recent biopsy results and treatment options. All questions answered during visit. Reviewed upcoming appts. Informed pt that will be called with his appt for paracentesis once scheduled. Instructed pt to call with any questions or needs. Pt verbalized understanding.

## 2022-06-06 NOTE — Progress Notes (Signed)
Tumor Board Documentation  Kyle Gutierrez was presented by Dr Tasia Catchings at our Tumor Board on 06/06/2022, which included representatives from medical oncology, radiology, pathology, surgical, pharmacy, pulmonology, radiation oncology, navigation, research, internal medicine, palliative care.  Kyle Gutierrez currently presents as a new patient, for new positive pathology, for [name] with history of the following treatments: surgical intervention(s).  Additionally, we reviewed previous medical and familial history, history of present illness, and recent lab results along with all available histopathologic and imaging studies. The tumor board considered available treatment options and made the following recommendations: Immunotherapy NGS Testing pending results  The following procedures/referrals were also placed: No orders of the defined types were placed in this encounter.   Clinical Trial Status: not discussed   Staging used: Pathologic Stage AJCC Staging:       Group: Stage IV Metastativ Adenocarcinoma of Lung   National site-specific guidelines   were discussed with respect to the case.  Tumor board is a meeting of clinicians from various specialty areas who evaluate and discuss patients for whom a multidisciplinary approach is being considered. Final determinations in the plan of care are those of the provider(s). The responsibility for follow up of recommendations given during tumor board is that of the provider.   Today's extended care, comprehensive team conference, Kyle Gutierrez was not present for the discussion and was not examined.   Multidisciplinary Tumor Board is a multidisciplinary case peer review process.  Decisions discussed in the Multidisciplinary Tumor Board reflect the opinions of the specialists present at the conference without having examined the patient.  Ultimately, treatment and diagnostic decisions rest with the primary provider(s) and the patient.

## 2022-06-06 NOTE — Progress Notes (Signed)
Spoke with patient over the phone to review appt for paracentesis on 10/6 at 2pm with 1:30pm arrival. Pt verbalized understanding and confirmed appt.

## 2022-06-06 NOTE — Assessment & Plan Note (Signed)
Likely secondary to liver cirrhosis, +/- malignant involvement. Ascites may contribute to his chronic SOB Recommend palliative paracentesis, will submit fluid studies.

## 2022-06-06 NOTE — Assessment & Plan Note (Signed)
MRI brain with and without contrast to complete staging. PET scan imaging and biopsy pathology result reviewed and discussed with patient. I discussed with patient about the diagnosis of stage IV lung adenocarcinoma. He understands that his condition is not curable. Recommend to send NGS.  If patient does not have palpable mutation, with PD-L1>= 50%, may consider immunotherapy. If PD-L1 <50%, immunotherapy monotherapy may not be effective. Patient has multiple comorbidities and I do not feel he is a candidate for aggressive chemotherapy. Patient is undecided and will update me if he desires to proceed with treatment.  Refer to palliative care

## 2022-06-07 ENCOUNTER — Other Ambulatory Visit: Payer: Self-pay | Admitting: Oncology

## 2022-06-07 ENCOUNTER — Ambulatory Visit
Admission: RE | Admit: 2022-06-07 | Discharge: 2022-06-07 | Disposition: A | Discharge status: Home / Self Care | Payer: Medicare Other | Source: Ambulatory Visit | Attending: Oncology | Admitting: Oncology

## 2022-06-07 DIAGNOSIS — R188 Other ascites: Secondary | ICD-10-CM | POA: Insufficient documentation

## 2022-06-07 DIAGNOSIS — R180 Malignant ascites: Secondary | ICD-10-CM

## 2022-06-07 NOTE — Progress Notes (Signed)
Patient presented to the [facility] Ultrasound department for possible paracentesis. Limited ultrasound examination of the abdomen revealed insufficient fluid to safely perform this procedure.   Image findings discussed with the patient. No procedure performed. Dr. Dwaine Gale aware. Images saved in Epic.  Soyla Dryer, [place] [phone] 06/07/2022, 3:17 PM

## 2022-06-09 ENCOUNTER — Encounter: Payer: Self-pay | Admitting: *Deleted

## 2022-06-10 ENCOUNTER — Encounter: Payer: Medicare Other | Admitting: Hospice and Palliative Medicine

## 2022-06-10 ENCOUNTER — Other Ambulatory Visit: Payer: Self-pay | Admitting: *Deleted

## 2022-06-10 ENCOUNTER — Encounter: Payer: Self-pay | Admitting: *Deleted

## 2022-06-10 MED ORDER — TRAMADOL HCL 50 MG PO TABS: 50.0000 mg | ORAL_TABLET | Freq: Four times a day (QID) | ORAL | 0 refills | Status: DC | PRN

## 2022-06-11 ENCOUNTER — Other Ambulatory Visit: Payer: Self-pay

## 2022-06-11 ENCOUNTER — Inpatient Hospital Stay (HOSPITAL_BASED_OUTPATIENT_CLINIC_OR_DEPARTMENT_OTHER): Payer: Medicare Other | Admitting: Hospice and Palliative Medicine

## 2022-06-11 ENCOUNTER — Encounter: Payer: Self-pay | Admitting: Hospice and Palliative Medicine

## 2022-06-11 VITALS — Temp 97.8°F | Resp 22 | Ht 71.0 in | Wt 191.5 lb

## 2022-06-11 DIAGNOSIS — Z515 Encounter for palliative care: Secondary | ICD-10-CM | POA: Diagnosis not present

## 2022-06-11 DIAGNOSIS — C3490 Malignant neoplasm of unspecified part of unspecified bronchus or lung: Secondary | ICD-10-CM

## 2022-06-11 MED ORDER — LIDOCAINE 5 % EX OINT: 1.0000 | TOPICAL_OINTMENT | CUTANEOUS | 0 refills | Status: AC | PRN

## 2022-06-11 MED ORDER — LORAZEPAM 0.5 MG PO TABS: ORAL_TABLET | ORAL | 0 refills | Status: DC

## 2022-06-11 NOTE — Progress Notes (Signed)
[place] at [facility] [phone] [phone]) [phone]   Name: Kyle Gutierrez Date: 06/11/2022 MRN: [id number]  DOB: [birth year]  Patient Care Team: Dion Body, MD as PCP - General (Family Medicine) Telford Nab, RN as Oncology Nurse Navigator    REASON FOR CONSULTATION: Kyle Gutierrez is a 88 y.o. 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  Patient was not felt to be a candidate for aggressive chemotherapy.  He was referred to palliative care to address goals.  SOCIAL HISTORY:     reports that he quit smoking about 45 years ago. His smoking use included cigarettes. He has never used smokeless tobacco. He reports current alcohol use. He reports that he does not use drugs.  Patient is married and lives at home with his wife at [facility].  He is a retired Engineer, maintenance (IT). He has two sons and a daughter, all of whom live out of state.  Patient has another son who is now deceased.  ADVANCE DIRECTIVES:  Not on file  CODE STATUS:   PAST MEDICAL HISTORY: Past Medical History:  Diagnosis Date   Benign prostatic hypertrophy    s/p TURP   Cardiomyopathy ([place])    Carotid artery plaque    bilateral   CHF (congestive heart failure) (HCC)    Chronic atrial fibrillation (HCC)    Complication of anesthesia    balance problem   Coronary artery disease, non-occlusive    Dyspnea    Dysrhythmia    atrial fib   Elevated lipids    Gross hematuria    Hypertension    Neuropathy, alcoholic ([place])    per Neurologic eval, remote   Nocturia    Poor balance    renal cell carcinoma 2002   left kidney cancer with partial nephrectomy.   Retinopathy    followed by [facility]   Sleep apnea    Thrombocytopenia, unspecified (HCC)    Urethral stricture    Urinary  frequency    Valvular cardiomyopathy (HCC)    severe mitral and tricuspid insufficiency, ECHO July 2012    PAST SURGICAL HISTORY:  Past Surgical History:  Procedure Laterality Date   CYSTOSCOPY W/ RETROGRADES N/A 05/06/2013   Procedure: CYSTOSCOPY WITH RETROGRADE PYELOGRAM,  BALLOON DILATION OF URETHRAL STRICTURE;  Surgeon: Dutch Gray, MD;  Location: [facility];  Service: Urology;  Laterality: N/A;   EYE SURGERY     bilateral cataracts removed   left rotator cuff repair     PACEMAKER LEADLESS INSERTION N/A 04/09/2022   Procedure: PACEMAKER LEADLESS INSERTION;  Surgeon: Isaias Cowman, MD;  Location: [facility];  Service: Cardiovascular;  Laterality: N/A;   PARTIAL NEPHRECTOMY Left    10'02   PROSTATE SURGERY     ROTATOR CUFF REPAIR Right    SHOULDER ARTHROSCOPY WITH OPEN ROTATOR CUFF REPAIR Left 07/02/2018   Procedure: SHOULDER ARTHROSCOPY WITH MINI OPEN ROTATOR CUFF REPAIR INCLUDING SUBSCAPULARIS ,SUBACROMINAL DECOMPRESSION.;  Surgeon: Thornton Park, MD;  Location: [facility];  Service: Orthopedics;  Laterality: Left;   TOTAL HIP ARTHROPLASTY Right 06/19/2017   Procedure: TOTAL HIP ARTHROPLASTY ANTERIOR APPROACH;  Surgeon: Hessie Knows, MD;  Location: [facility];  Service: Orthopedics;  Laterality: Right;   TRANSURETHRAL RESECTION OF PROSTATE  2003    HEMATOLOGY/ONCOLOGY HISTORY:  Oncology History  Primary lung adenocarcinoma ([place])  03/22/2022 Imaging  CT chest without contrast showed bilateral pulmonary nodules and mediastinal lymphadenopathy most consistent with neoplastic process.  Primary lesion is not definitively identified on this study.  Small volume on upper abdominal ascites.  Low attenuating liver with an irregular border worrisome for cirrhosis.  Marked cardiomegaly, coronary artery disease.  Aortic atherosclerosis, emphysema.   04/10/2022 Imaging   patient underwent Micra Leadless pacemaker implant    05/10/2022 Imaging    PET scan showed  Hypermetabolic lower  neck lymphadenopathy- Left level 2 lymph nodes measure up to 6 mm (2/47), SUV max 1.8. Hypermetabolic left level 5 lymph nodes with index 6 mm lymph node (2/56), SUV max 2.2.  hypermetabolic supraclavicle mediastinal and left hilar adenopathy,Index low right paratracheal lymph node measures 1.3 cm (2/84), SUV max 6.4.  Hypermetabolic bilateral pulmonary nodules.Index spiculated nodule in the apical left upper lobe measures 2.5 x 3.1 cm (2/73) and has enlarged minimally from 03/22/2022 with SUV max 10.4. Scattered hypermetabolic osseous lesions, index lytic lesion in the chest which showed anterior left sixth rib has an SUV max 4.7.     05/10/2022 Imaging   PET scan showed 1. Findings most compatible with stage IV primary bronchogenic carcinoma as evidenced by hypermetabolic bilateral pulmonary nodules, hypermetabolic lymph nodes in the low neck and chest and hypermetabolic bone lesions. 2. Cirrhosis with moderate ascites.3. Small left pleural effusion. 4. Aortic atherosclerosis (ICD10-I70.0). Coronary artery calcification. 5.  Emphysema (ICD10-J43.9).   05/29/2022 Procedure   Biopsy of left supraclavicular adenopathy   05/29/2022 Initial Diagnosis   Primary lung adenocarcinoma (HCC)  05/29/22 Left supraclavicular adenopathy biopsy showed metastatic adenocarcinoma of lung.    06/06/2022 Cancer Staging   Staging form: Lung, AJCC 8th Edition - Clinical stage from 06/06/2022: Stage IV (cT2, cN3, cM1) - Signed by Rickard Patience, MD on 06/06/2022 Stage prefix: Initial diagnosis     ALLERGIES:  is allergic to ace inhibitors, latex, tape, and tapentadol.  MEDICATIONS:  Current Outpatient Medications  Medication Sig Dispense Refill   acetaminophen (TYLENOL) 500 MG tablet Take 1,000 mg by mouth 2 (two) times daily.      furosemide (LASIX) 40 MG tablet Take 40 mg by mouth daily.     losartan (COZAAR) 100 MG tablet Take 100 mg by mouth daily.     metoprolol succinate (TOPROL-XL) 25 MG 24 hr tablet Take 12.5  mg by mouth daily.      Multiple Vitamins-Minerals (PRESERVISION AREDS 2 PO) Take 1 capsule by mouth 2 (two) times daily.     simvastatin (ZOCOR) 40 MG tablet Take 1 tablet (40 mg total) by mouth every morning. 90 tablet 2   spironolactone (ALDACTONE) 25 MG tablet Take 1 tablet by mouth daily.     traMADol (ULTRAM) 50 MG tablet Take 1 tablet (50 mg total) by mouth every 6 (six) hours as needed. 30 tablet 0   warfarin (COUMADIN) 2 MG tablet Take 2 mg by mouth as directed. Take 3 mg (1 and 1/2 tablets) Sunday, Monday, Wednesday, and Friday and 2 mg (1 tablet) on Tuesday, Thursday, and Saturday     No current facility-administered medications for this visit.    VITAL SIGNS: There were no vitals taken for this visit. There were no vitals filed for this visit.  Estimated body mass index is 27.64 kg/m as calculated from the following:   Height as of 05/22/22: 5\' 11"  (1.803 m).   Weight as of 06/06/22: 198 lb 3.2 oz (89.9 kg).  LABS: CBC:    Component Value Date/Time  WBC 7.7 05/17/2022 0950   HGB 12.4 (L) 05/17/2022 0950   HGB 14.5 05/04/2021 1336   HCT 39.1 05/17/2022 0950   HCT 44.2 05/04/2021 1336   PLT 129 (L) 05/17/2022 0950   PLT 122 (L) 03/19/2014 1006   MCV 98.0 05/17/2022 0950   MCV 93 03/19/2014 1006   NEUTROABS 5.8 05/17/2022 0950   NEUTROABS 8.5 (H) 03/17/2014 0637   LYMPHSABS 1.2 05/17/2022 0950   LYMPHSABS 0.6 (L) 03/17/2014 0637   MONOABS 0.6 05/17/2022 0950   MONOABS 0.5 03/17/2014 0637   EOSABS 0.2 05/17/2022 0950   EOSABS 0.0 03/17/2014 0637   BASOSABS 0.0 05/17/2022 0950   BASOSABS 0.0 03/17/2014 0637   Comprehensive Metabolic Panel:    Component Value Date/Time   NA 140 05/17/2022 0950   NA 139 03/19/2014 1006   K 4.2 05/17/2022 0950   K 4.0 03/19/2014 1006   CL 108 05/17/2022 0950   CL 103 03/19/2014 1006   CO2 25 05/17/2022 0950   CO2 34 (H) 03/19/2014 1006   BUN 43 (H) 05/17/2022 0950   BUN 19 (H) 03/19/2014 1006   CREATININE 1.38 (H) 05/17/2022  0950   CREATININE 1.34 (H) 03/19/2014 1006   GLUCOSE 121 (H) 05/17/2022 0950   GLUCOSE 93 03/19/2014 1006   CALCIUM 9.5 05/17/2022 0950   CALCIUM 9.2 03/19/2014 1006   AST 24 05/17/2022 0950   AST 35 03/19/2014 1006   ALT 25 05/17/2022 0950   ALT 22 03/19/2014 1006   ALKPHOS 191 (H) 05/17/2022 0950   ALKPHOS 67 03/19/2014 1006   BILITOT 1.1 05/17/2022 0950   BILITOT 1.0 03/19/2014 1006   PROT 6.9 05/17/2022 0950   PROT 6.8 03/19/2014 1006   ALBUMIN 3.8 05/17/2022 0950   ALBUMIN 3.1 (L) 03/19/2014 1006    RADIOGRAPHIC STUDIES: US Abdomen Limited  Result Date: 06/07/2022 CLINICAL DATA:  Ascites EXAM: LIMITED ABDOMEN ULTRASOUND FOR ASCITES TECHNIQUE: Limited ultrasound survey for ascites was performed in all four abdominal quadrants. COMPARISON:  PET-CT 05/08/2022 FINDINGS: Trace perihepatic ascites, insufficient for aspiration. IMPRESSION: Trace perihepatic ascites, insufficient for aspiration. Electronically Signed   By: Miachel Roux M.D.   On: 06/07/2022 15:09   [place] CORE BIOPSY (LYMPH NODES)  Result Date: 05/29/2022 INDICATION: 88 year old male with a history suspected metastatic lung carcinoma referred for diagnostic biopsy supraclavicular lymph nodes EXAM: ULTRASOUND-GUIDED BIOPSY LEFT SUPRACLAVICULAR LYMPH NODES MEDICATIONS: None. ANESTHESIA/SEDATION: None COMPLICATIONS: None PROCEDURE: Informed written consent was obtained from the patient after a thorough discussion of the procedural risks, benefits and alternatives. All questions were addressed. Maximal Sterile Barrier Technique was utilized including caps, mask, sterile gowns, sterile gloves, sterile drape, hand hygiene and skin antiseptic. A timeout was performed prior to the initiation of the procedure. Ultrasound survey was performed with images stored and sent to PACs. The left neck was prepped with chlorhexidine in a sterile fashion, and a sterile drape was applied covering the operative field. A sterile gown and sterile gloves  were used for the procedure. Local anesthesia was provided with 1% Lidocaine. Ultrasound guidance was used to infiltrate the region with 1% lidocaine for local anesthesia. Multiple 18 gauge core biopsy were then acquired of left supraclavicular pathologic adenopathy using ultrasound guidance. Images were stored. Specimen placed into fresh specimen for transport to the lab. Final image was stored after biopsy. Patient tolerated the procedure well and remained hemodynamically stable throughout. No complications were encountered and no significant blood loss was encounter IMPRESSION: Status post ultrasound-guided biopsy pathologic left supraclavicular adenopathy Signed, York Cerise  Pasty Arch, DO, ABVM, RPVI [facility] Electronically Signed   By: Corrie Mckusick D.O.   On: 05/29/2022 14:16   DG Chest 1 View  Result Date: 05/24/2022 CLINICAL DATA:  Left rib pain. EXAM: CHEST  1 VIEW COMPARISON:  Chest radiograph December 22, 2021; PET-CT May 08, 2022 FINDINGS: Cardiomegaly. Aortic atherosclerosis. Redemonstrated multiple bilateral pulmonary nodules throughout the left and right lungs most pronounced within the left lung apex measuring up to 3 cm. Bilateral interstitial pulmonary opacities. Small bilateral pleural effusions. Thoracic spine degenerative changes. IMPRESSION: Bilateral pulmonary nodules as recently evaluated on PET-CT compatible with history of metastatic bronchogenic carcinoma. Cardiomegaly. Mild interstitial opacities suggestive of edema with associated small pleural effusions. Electronically Signed   By: Lovey Newcomer M.D.   On: 05/24/2022 06:18    PERFORMANCE STATUS (ECOG) : 3 - Symptomatic, >50% confined to bed  Review of Systems Unless otherwise noted, a complete review of systems is negative.  Physical Exam General: NAD Pulmonary: On O2 Extremities: no edema, no joint deformities Skin: no rashes Neurological: Weakness but otherwise  nonfocal  IMPRESSION: Patient has met with Dr. Tasia Catchings who did not feel that patient was a candidate for aggressive chemotherapy.  NGS testing was recommended to evaluate for possible alternative therapy such as immunotherapy.    I had a lengthy conversation with patient and wife regarding goals.  Patient recognizes that treatment options are limited.  We discussed the probable terminal nature of his cancer and he had questions regarding prognostication.  Immunotherapy has been discussed but he is undecided as of today's visit.  He is ultimately interested in pursuing MRI of the brain to complete staging work-up.  However, he asked for medication to help with claustrophobia.  We will plan follow-up visit after the MRI to further discuss goals.  We did discuss hospice involvement in detail should patient opt not to pursue further treatment or if he is ultimately felt not to have any treatment options available.  Patient has advanced directives but these are not on file.  He would want his wife to be his decision-maker if necessary.  We had a frank conversation regarding the probable futility of aggressive measures at end-of-life.  He plans to speak with his family and think about end-of-life decision making.  Symptomatically, he has had some bilateral lower rib pain for which he has been taking low-dose tramadol.  He has been using a half a tablet of tramadol about every 6 hours around-the-clock.  He has found the full dose to cause drowsiness.  This does appear to be helping the pain and I would recommend continuing this regimen for now.  Patient does report some skin breakdown/early stage pressure ulcer, for which he was recently seen by his dermatologist who recommended zinc oxide and asked [place] to prescribe topical lidocaine.  Discussed pressure offloading strategies.  Performance status is quite poor.  Patient is mostly sedentary.    PLAN: -Continue current scope of treatment -Lorazepam 0.25-0.5mg  as  needed for MRI -Topical lidocaine -Follow-up after MRI to continue conversations regarding goals   Patient expressed understanding and was in agreement with this plan. He also understands that He can call the clinic at any time with any questions, concerns, or complaints.     Time Total: 60 minutes  Visit consisted of counseling and education dealing with the complex and emotionally intense issues of symptom management and palliative care in the setting of serious and potentially life-threatening illness.Greater than 50%  of  this time was spent counseling and coordinating care related to the above assessment and plan.  Signed by: Altha Harm, PhD, NP-C

## 2022-06-12 ENCOUNTER — Encounter: Payer: Self-pay | Admitting: *Deleted

## 2022-06-12 NOTE — Progress Notes (Signed)
Attempted to contact pt by phone to check in and see if he had any further questions following his visit with Josh from yesterday. Pt did not answer. Message left with patient to call back if has any further questions or needs.

## 2022-06-13 ENCOUNTER — Telehealth: Payer: Self-pay | Admitting: *Deleted

## 2022-06-13 NOTE — Telephone Encounter (Signed)
Your information has been submitted to [employer]. [employer] will review the request and will issue a decision, typically within 1-3 days from your submission. You can check the updated outcome later by reopening this request.  If [employer] has not responded in 1-3 days or if you have any questions about your ePA request, please contact [employer] at [phone]. If you think there may be a problem with your PA request, use our live chat feature at the bottom right.

## 2022-06-13 NOTE — Telephone Encounter (Signed)
NOTICE OF APPROVAL - [employer] for Lidocaine ointment sent to his chart.

## 2022-06-13 NOTE — Telephone Encounter (Signed)
PRIOR AUTH STARTED- [facility] sent to his chart. For Lidocaine.    [facility] Key: [id number] - PA Case ID: [id number] - Rx #: [id number]

## 2022-06-17 ENCOUNTER — Encounter: Payer: Self-pay | Admitting: *Deleted

## 2022-06-17 ENCOUNTER — Encounter: Payer: Self-pay | Admitting: Oncology

## 2022-06-19 ENCOUNTER — Ambulatory Visit (HOSPITAL_COMMUNITY)
Admission: RE | Admit: 2022-06-19 | Discharge: 2022-06-19 | Disposition: A | Discharge status: Home / Self Care | Payer: Medicare Other | Source: Ambulatory Visit | Attending: Oncology | Admitting: Oncology

## 2022-06-19 DIAGNOSIS — R911 Solitary pulmonary nodule: Secondary | ICD-10-CM | POA: Insufficient documentation

## 2022-06-19 DIAGNOSIS — R591 Generalized enlarged lymph nodes: Secondary | ICD-10-CM | POA: Diagnosis present

## 2022-06-19 MED ORDER — GADOBUTROL 1 MMOL/ML IV SOLN
10.0000 mL | Freq: Once | INTRAVENOUS | Status: AC | PRN
  Administered 2022-06-19: 10 mL via INTRAVENOUS

## 2022-06-21 ENCOUNTER — Inpatient Hospital Stay: Payer: Medicare Other

## 2022-06-21 NOTE — Progress Notes (Signed)
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  Past medical history of CAD, CHF,RCC s/p partial nephrectomy, cirrhosis, COPD.  Patient not felt to be a candidate for aggressive chemotherapy.  Awaiting poc regarding immunotherapy.  Followed by Palliative Care.   Spoke with patient and wife via phone.  Patient reports no appetite for especially the last 3 weeks.  Does not like the taste of red meat anymore, no seafood or pasta dishes with red sauce.  Sometimes smell of food makes him sick.  Has trouble breathing and eating.  Has been eating small frequent mini meals/snacks during the day.  Breakfast maybe cereal and fruit or crackin egg. AM snack maybe 1/2 or whole Atkins shake. Lunch maybe cottage cheese and fruit and soup or 1/2 sandwich and soup.  PM snack maybe fruit with peanut butter or cheese and crackers.  Dinner is usually chicken and vegetable. 8pm may have a snack of frozen yogurt or cookie.  Lives at [facility].  Has some issues with nausea and smells of foods but not on a regular basis.  Has issues with constipation and wanting to increase fiber in diet.      Medications: reviewed  Labs: reviewed from 9/15  Anthropometrics:   Height: 71 inches Weight: 191 lb 8 oz on 10/10 234 lb in Dec of 2023 and was intentionally trying to reduce weight.  Says this am weighs 176 lb (no clothes), taking toresmide  BMI: 26  18% weight loss in the last year (some intentional)  NUTRITION DIAGNOSIS: Inadequate oral intake related to cancer, chronic illness as evidenced by 18% weight loss in the last year and decreased intake   INTERVENTION:  Encouraged continued small frequent meals Recommend 350 + calorie shake (ensure complete, ensure plus, boost plus, equate plus). Will leave samples for patient to pick up and coupons on 10/24. Encouraged high calorie, high protein foods and discussed ways to add  calories to foods Discuss strategies to help with constipation.   Patient does not feel he needs anti nausea medication at this time Discussed option of trying appetite stimulant.  Recipe booklet and samples with coupons left a registration desk for patient to pick up on 10/24 when back at cancer center.  Contact information provided and patient will reach out to RD if needed in the future    NEXT VISIT: no follow-up Patient prefers to call RD if needed  Marcell Pfeifer B. Zenia Resides, [place], [place] Registered Dietitian [phone]

## 2022-06-25 ENCOUNTER — Encounter: Payer: Self-pay | Admitting: *Deleted

## 2022-06-25 ENCOUNTER — Inpatient Hospital Stay (HOSPITAL_BASED_OUTPATIENT_CLINIC_OR_DEPARTMENT_OTHER): Payer: Medicare Other | Admitting: Oncology

## 2022-06-25 ENCOUNTER — Inpatient Hospital Stay (HOSPITAL_BASED_OUTPATIENT_CLINIC_OR_DEPARTMENT_OTHER): Payer: Medicare Other | Admitting: Hospice and Palliative Medicine

## 2022-06-25 ENCOUNTER — Encounter: Payer: Self-pay | Admitting: Oncology

## 2022-06-25 DIAGNOSIS — C3490 Malignant neoplasm of unspecified part of unspecified bronchus or lung: Secondary | ICD-10-CM

## 2022-06-25 DIAGNOSIS — Z7189 Other specified counseling: Secondary | ICD-10-CM

## 2022-06-25 DIAGNOSIS — N189 Chronic kidney disease, unspecified: Secondary | ICD-10-CM

## 2022-06-25 DIAGNOSIS — D631 Anemia in chronic kidney disease: Secondary | ICD-10-CM

## 2022-06-25 DIAGNOSIS — G893 Neoplasm related pain (acute) (chronic): Secondary | ICD-10-CM

## 2022-06-25 MED ORDER — MORPHINE SULFATE (CONCENTRATE) 10 MG /0.5 ML PO SOLN: 2.0000 mg | ORAL | 0 refills | Status: DC | PRN

## 2022-06-25 NOTE — Assessment & Plan Note (Signed)
Discussed with the patient. He understands that his condition is not curable and treatment is with palliative intent.

## 2022-06-25 NOTE — Assessment & Plan Note (Signed)
Likely due to chronic kidney disease.  Monitor.

## 2022-06-25 NOTE — Assessment & Plan Note (Addendum)
MRI brain showed no brain metastatic disease.  PET scan imaging and biopsy pathology result reviewed and discussed with patient. I discussed with patient about the diagnosis of stage IV lung adenocarcinoma. He understands that his condition is not curable. No enough tissu for NGS.  He is a poor candidate for chemotherapy I had lengthy discussion with patient, his condition is not curable, prognosis is poor. I recommend hospice/comfort care. Patient is interested and he will further discuss with palliative care.

## 2022-06-25 NOTE — Progress Notes (Signed)
[place] at [facility] [phone] [phone]   Name: [facility] Date: 06/25/2022 MRN: [id number]  DOB: Dec 28, 1931  Patient Care Team: Dion Body, MD as PCP - General (Family Medicine) Telford Nab, RN as Oncology Nurse Navigator    REASON FOR CONSULTATION: [facility] is a 88 y.o. 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  Patient was not felt to be a candidate for aggressive chemotherapy.  He was referred to palliative care to address goals.  SOCIAL HISTORY:     reports that he quit smoking about 45 years ago. His smoking use included cigarettes. He has never used smokeless tobacco. He reports current alcohol use. He reports that he does not use drugs.  Patient is married and lives at home with his wife at [facility].  He is a retired Engineer, maintenance (IT). He has two sons and a daughter, all of whom live out of state.  Patient has another son who is now deceased.  ADVANCE DIRECTIVES:  Not on file  CODE STATUS: DNR/DNI (DNR order signed on 06/25/22)  PAST MEDICAL HISTORY: Past Medical History:  Diagnosis Date   Benign prostatic hypertrophy    s/p TURP   Cardiomyopathy ([place])    Carotid artery plaque    bilateral   CHF (congestive heart failure) (HCC)    Chronic atrial fibrillation (HCC)    Complication of anesthesia    balance problem   Coronary artery disease, non-occlusive    Dyspnea    Dysrhythmia    atrial fib   Elevated lipids    Gross hematuria    Hypertension    Neuropathy, alcoholic ([facility])    per Neurologic eval, remote   Nocturia    Poor balance    renal cell carcinoma 2002   left kidney cancer with partial nephrectomy.   Retinopathy    followed by [facility]   Sleep apnea    Thrombocytopenia, unspecified (HCC)     Urethral stricture    Urinary frequency    Valvular cardiomyopathy (HCC)    severe mitral and tricuspid insufficiency, ECHO July 2012    PAST SURGICAL HISTORY:  Past Surgical History:  Procedure Laterality Date   CYSTOSCOPY W/ RETROGRADES N/A 05/06/2013   Procedure: CYSTOSCOPY WITH RETROGRADE PYELOGRAM,  BALLOON DILATION OF URETHRAL STRICTURE;  Surgeon: Dutch Gray, MD;  Location: [facility];  Service: Urology;  Laterality: N/A;   EYE SURGERY     bilateral cataracts removed   left rotator cuff repair     PACEMAKER LEADLESS INSERTION N/A 04/09/2022   Procedure: PACEMAKER LEADLESS INSERTION;  Surgeon: Isaias Cowman, MD;  Location: [facility];  Service: Cardiovascular;  Laterality: N/A;   PARTIAL NEPHRECTOMY Left    10'02   PROSTATE SURGERY     ROTATOR CUFF REPAIR Right    SHOULDER ARTHROSCOPY WITH OPEN ROTATOR CUFF REPAIR Left 07/02/2018   Procedure: SHOULDER ARTHROSCOPY WITH MINI OPEN ROTATOR CUFF REPAIR INCLUDING SUBSCAPULARIS ,SUBACROMINAL DECOMPRESSION.;  Surgeon: Thornton Park, MD;  Location: [facility];  Service: Orthopedics;  Laterality: Left;   TOTAL HIP ARTHROPLASTY Right 06/19/2017   Procedure: TOTAL HIP ARTHROPLASTY ANTERIOR APPROACH;  Surgeon: Hessie Knows, MD;  Location: [facility];  Service: Orthopedics;  Laterality: Right;   TRANSURETHRAL RESECTION OF PROSTATE  2003    HEMATOLOGY/ONCOLOGY HISTORY:  Oncology History  Primary lung adenocarcinoma (  [facility])  03/22/2022 Imaging   CT chest without contrast showed bilateral pulmonary nodules and mediastinal lymphadenopathy most consistent with neoplastic process.  Primary lesion is not definitively identified on this study.  Small volume on upper abdominal ascites.  Low attenuating liver with an irregular border worrisome for cirrhosis.  Marked cardiomegaly, coronary artery disease.  Aortic atherosclerosis, emphysema.   04/10/2022 Imaging   patient underwent Micra Leadless pacemaker implant    05/10/2022 Imaging    PET  scan showed  Hypermetabolic lower neck lymphadenopathy- Left level 2 lymph nodes measure up to 6 mm (2/47), SUV max 1.8. Hypermetabolic left level 5 lymph nodes with index 6 mm lymph node (2/56), SUV max 2.2.  hypermetabolic supraclavicle mediastinal and left hilar adenopathy,Index low right paratracheal lymph node measures 1.3 cm (2/84), SUV max 6.4.  Hypermetabolic bilateral pulmonary nodules.Index spiculated nodule in the apical left upper lobe measures 2.5 x 3.1 cm (2/73) and has enlarged minimally from 03/22/2022 with SUV max 10.4. Scattered hypermetabolic osseous lesions, index lytic lesion in the chest which showed anterior left sixth rib has an SUV max 4.7.     05/10/2022 Imaging   PET scan showed 1. Findings most compatible with stage IV primary bronchogenic carcinoma as evidenced by hypermetabolic bilateral pulmonary nodules, hypermetabolic lymph nodes in the low neck and chest and hypermetabolic bone lesions. 2. Cirrhosis with moderate ascites.3. Small left pleural effusion. 4. Aortic atherosclerosis (ICD10-I70.0). Coronary artery calcification. 5.  Emphysema (ICD10-J43.9).   05/29/2022 Procedure   Biopsy of left supraclavicular adenopathy   05/29/2022 Initial Diagnosis   Primary lung adenocarcinoma ([facility])  05/29/22 Left supraclavicular adenopathy biopsy showed metastatic adenocarcinoma of lung.    06/06/2022 Cancer Staging   Staging form: Lung, AJCC 8th Edition - Clinical stage from 06/06/2022: Stage IV (cT2, cN3, cM1) - Signed by Earlie Server, MD on 06/06/2022 Stage prefix: Initial diagnosis   06/19/2022 Imaging   MRI brain  Negative for metastatic disease to the brain     ALLERGIES:  is allergic to ace inhibitors, latex, tape, and tapentadol.  MEDICATIONS:  Current Outpatient Medications  Medication Sig Dispense Refill   Morphine Sulfate (MORPHINE CONCENTRATE) 10 mg / 0.5 ml concentrated solution Take 0.1 mLs (2 mg total) by mouth every 2 (two) hours as needed for severe pain or  shortness of breath. 30 mL 0   acetaminophen (TYLENOL) 500 MG tablet Take 1,000 mg by mouth 2 (two) times daily.      lidocaine (LIDODERM) 5 % Place onto the skin. (Patient not taking: Reported on 06/25/2022)     lidocaine (XYLOCAINE) 5 % ointment Apply 1 Application topically as needed. (Patient not taking: Reported on 06/25/2022) 35.44 g 0   losartan (COZAAR) 100 MG tablet Take 100 mg by mouth daily. (Patient not taking: Reported on 06/11/2022)     metoprolol succinate (TOPROL-XL) 25 MG 24 hr tablet Take 12.5 mg by mouth daily.      Multiple Vitamins-Minerals (PRESERVISION AREDS 2 PO) Take 1 capsule by mouth 2 (two) times daily.     simvastatin (ZOCOR) 40 MG tablet Take 1 tablet (40 mg total) by mouth every morning. 90 tablet 2   spironolactone (ALDACTONE) 50 MG tablet Take 50 mg by mouth daily.     torsemide (DEMADEX) 20 MG tablet Take 40 mg by mouth daily.     traMADol (ULTRAM) 50 MG tablet Take 1 tablet (50 mg total) by mouth every 6 (six) hours as needed. (Patient taking differently: Take 25 mg by mouth every 6 (six) hours  as needed.) 30 tablet 0   warfarin (COUMADIN) 2 MG tablet Take 2 mg by mouth as directed. Take as directed 3 days a week     warfarin (COUMADIN) 3 MG tablet Take 3 mg by mouth daily. Take 4 days a week     No current facility-administered medications for this visit.    VITAL SIGNS: There were no vitals taken for this visit. There were no vitals filed for this visit.  Estimated body mass index is 26.22 kg/m as calculated from the following:   Height as of 06/11/22: 5\' 11"  (1.803 m).   Weight as of an earlier encounter on 06/25/22: 188 lb (85.3 kg).  LABS: CBC:    Component Value Date/Time   WBC 7.7 05/17/2022 0950   HGB 12.4 (L) 05/17/2022 0950   HGB 14.5 05/04/2021 1336   HCT 39.1 05/17/2022 0950   HCT 44.2 05/04/2021 1336   PLT 129 (L) 05/17/2022 0950   PLT 122 (L) 03/19/2014 1006   MCV 98.0 05/17/2022 0950   MCV 93 03/19/2014 1006   NEUTROABS 5.8  05/17/2022 0950   NEUTROABS 8.5 (H) 03/17/2014 0637   LYMPHSABS 1.2 05/17/2022 0950   LYMPHSABS 0.6 (L) 03/17/2014 0637   MONOABS 0.6 05/17/2022 0950   MONOABS 0.5 03/17/2014 0637   EOSABS 0.2 05/17/2022 0950   EOSABS 0.0 03/17/2014 0637   BASOSABS 0.0 05/17/2022 0950   BASOSABS 0.0 03/17/2014 0637   Comprehensive Metabolic Panel:    Component Value Date/Time   NA 140 05/17/2022 0950   NA 139 03/19/2014 1006   K 4.2 05/17/2022 0950   K 4.0 03/19/2014 1006   CL 108 05/17/2022 0950   CL 103 03/19/2014 1006   CO2 25 05/17/2022 0950   CO2 34 (H) 03/19/2014 1006   BUN 43 (H) 05/17/2022 0950   BUN 19 (H) 03/19/2014 1006   CREATININE 1.38 (H) 05/17/2022 0950   CREATININE 1.34 (H) 03/19/2014 1006   GLUCOSE 121 (H) 05/17/2022 0950   GLUCOSE 93 03/19/2014 1006   CALCIUM 9.5 05/17/2022 0950   CALCIUM 9.2 03/19/2014 1006   AST 24 05/17/2022 0950   AST 35 03/19/2014 1006   ALT 25 05/17/2022 0950   ALT 22 03/19/2014 1006   ALKPHOS 191 (H) 05/17/2022 0950   ALKPHOS 67 03/19/2014 1006   BILITOT 1.1 05/17/2022 0950   BILITOT 1.0 03/19/2014 1006   PROT 6.9 05/17/2022 0950   PROT 6.8 03/19/2014 1006   ALBUMIN 3.8 05/17/2022 0950   ALBUMIN 3.1 (L) 03/19/2014 1006    RADIOGRAPHIC STUDIES: MR Brain W Wo Contrast  Result Date: 06/21/2022 CLINICAL DATA:  Assess for metastatic disease EXAM: MRI HEAD WITHOUT AND WITH CONTRAST TECHNIQUE: Multiplanar, multiecho pulse sequences of the brain and surrounding structures were obtained without and with intravenous contrast. CONTRAST:  15mL GADAVIST GADOBUTROL 1 MMOL/ML IV SOLN COMPARISON:  Head CT 03/20/2011 FINDINGS: Brain: No enhancement or swelling to suggest metastatic disease. Cerebral volume loss in keeping with age. No significant chronic small vessel ischemic type change. No incidental infarct, hydrocephalus, or collection Vascular: Normal flow voids. Skull and upper cervical spine: Normal marrow signal. Sinuses/Orbits: Negative for mass.   Bilateral cataract resection IMPRESSION: Negative for metastatic disease to the brain. Electronically Signed   By: Jorje Guild M.D.   On: 06/21/2022 18:37   [facility] RAD RESULTS  Result Date: 06/12/2022 Ordered by an unspecified provider.  US Abdomen Limited  Result Date: 06/07/2022 CLINICAL DATA:  Ascites EXAM: LIMITED ABDOMEN ULTRASOUND FOR ASCITES TECHNIQUE: Limited ultrasound  survey for ascites was performed in all four abdominal quadrants. COMPARISON:  PET-CT 05/08/2022 FINDINGS: Trace perihepatic ascites, insufficient for aspiration. IMPRESSION: Trace perihepatic ascites, insufficient for aspiration. Electronically Signed   By: Miachel Roux M.D.   On: 06/07/2022 15:09   [place] CORE BIOPSY (LYMPH NODES)  Result Date: 05/29/2022 INDICATION: 88 year old male with a history suspected metastatic lung carcinoma referred for diagnostic biopsy supraclavicular lymph nodes EXAM: ULTRASOUND-GUIDED BIOPSY LEFT SUPRACLAVICULAR LYMPH NODES MEDICATIONS: None. ANESTHESIA/SEDATION: None COMPLICATIONS: None PROCEDURE: Informed written consent was obtained from the patient after a thorough discussion of the procedural risks, benefits and alternatives. All questions were addressed. Maximal Sterile Barrier Technique was utilized including caps, mask, sterile gowns, sterile gloves, sterile drape, hand hygiene and skin antiseptic. A timeout was performed prior to the initiation of the procedure. Ultrasound survey was performed with images stored and sent to PACs. The left neck was prepped with chlorhexidine in a sterile fashion, and a sterile drape was applied covering the operative field. A sterile gown and sterile gloves were used for the procedure. Local anesthesia was provided with 1% Lidocaine. Ultrasound guidance was used to infiltrate the region with 1% lidocaine for local anesthesia. Multiple 18 gauge core biopsy were then acquired of left supraclavicular pathologic adenopathy using ultrasound guidance. Images were  stored. Specimen placed into fresh specimen for transport to the lab. Final image was stored after biopsy. Patient tolerated the procedure well and remained hemodynamically stable throughout. No complications were encountered and no significant blood loss was encounter IMPRESSION: Status post ultrasound-guided biopsy pathologic left supraclavicular adenopathy Signed, Dulcy Fanny. Nadene Rubins, RPVI [facility] Electronically Signed   By: Corrie Mckusick D.O.   On: 05/29/2022 14:16    PERFORMANCE STATUS (ECOG) : 3 - Symptomatic, >50% confined to bed  Review of Systems Unless otherwise noted, a complete review of systems is negative.  Physical Exam General: NAD Pulmonary: On O2 Extremities: no edema, no joint deformities Skin: no rashes Neurological: Weakness but otherwise nonfocal  IMPRESSION: Patient returns to clinic accompanied by his wife.  He saw Dr. Tasia Catchings today and has decided against pursuing treatment of the cancer.  Instead, he wants to focus on comfort and quality of life.  Patient is in agreement with pursuing hospice at home, which we discussed in detail today.  Symptomatically, patient complains of exertional dyspnea.  He is on O2.  We discussed the role of opioids in managing pain/dyspnea.  Patient is in agreement with trial of low-dose morphine.  Of note, he appears to be quite sensitive to potential sedating medications.  We will start morphine at extremely low dose.  Patient says that he has considered end-of-life decision-making and would not want to be resuscitated or have his life prolonged artificial machines.  He was in agreement with DNR/DNI.  I signed a DNR order for him to take home.  PLAN: -Best supportive care -Referral to hospice -Morphine elixir as needed for pain/dyspnea -DNR/DNI -Follow-up as needed  Case and plan discussed with Dr. Tasia Catchings  Patient expressed understanding and was in agreement with this plan.  He also understands that He can call the clinic at any time with any questions, concerns, or complaints.    Time Total: 20 minutes  Visit consisted of counseling and education dealing with the complex and emotionally intense issues of symptom management and palliative care in the setting of serious and potentially life-threatening illness.Greater than 50%  of this time was spent counseling and coordinating care related  to the above assessment and plan.  Signed by: Altha Harm, PhD, NP-C

## 2022-06-25 NOTE — Progress Notes (Signed)
Pt states within the past two weeks his breathing has become more difficult. Pt will like to discuss withdrawal feelings from tramadol which believes it is leading to sleep problems. Pt states he has been experiencing low grade kidney pain for about a week.

## 2022-06-25 NOTE — Assessment & Plan Note (Signed)
Patient has Rx of Tramdol 50mg  tabs He feels currently not in need of any pain medication.  Previously did not tolerate 50mg  Q4-6 hours.  I suggest him to try Tramadol 25mg  Q6 hours as needed for pain.

## 2022-06-25 NOTE — Progress Notes (Signed)
Hematology/Oncology Consult Note [phone] [phone]     REFERRING PROVIDER: Dion Body, MD   Patient Care Team: Dion Body, MD as PCP - General (Family Medicine) Telford Nab, RN as Oncology Nurse Navigator    ASSESSMENT & PLAN:   Cancer Staging  Primary lung adenocarcinoma [facility]) Staging form: Lung, AJCC 8th Edition - Clinical stage from 06/06/2022: Stage IV (cT2, cN3, cM1) - Signed by Kyle Server, MD on 06/06/2022   Primary lung adenocarcinoma [facility]) MRI brain showed no brain metastatic disease.  PET scan imaging and biopsy pathology result reviewed and discussed with patient. I discussed with patient about the diagnosis of stage IV lung adenocarcinoma. He understands that his condition is not curable. No enough tissu for NGS.  He is a poor candidate for chemotherapy I had lengthy discussion with patient, his condition is not curable, prognosis is poor. I recommend hospice/comfort care. Patient is interested and he will further discuss with palliative care.    Anemia in chronic kidney disease (CKD) Likely due to chronic kidney disease.  Monitor.  Goals of care, counseling/discussion Discussed with the patient. He understands that his condition is not curable and treatment is with palliative intent.  Neoplasm related pain Patient has Rx of Tramdol 50mg  tabs He feels currently not in need of any pain medication.  Previously did not tolerate 50mg  Q4-6 hours.  I suggest him to try Tramadol 25mg  Q6 hours as needed for pain.    No orders of the defined types were placed in this encounter.  Follow up TBD All questions were answered. The patient knows to call the clinic with any problems, questions or concerns.  Kyle Server, MD, PhD [facility] Hematology Oncology 06/25/2022   CHIEF COMPLAINTS/PURPOSE OF CONSULTATION:  Lung nodule, lymphadenopathy  HISTORY OF PRESENTING ILLNESS:  Kyle Gutierrez 88 y.o. male presents to  establish care for Stage IV lung cancer treatment Oncology History  Primary lung adenocarcinoma ([place])  03/22/2022 Imaging   CT chest without contrast showed bilateral pulmonary nodules and mediastinal lymphadenopathy most consistent with neoplastic process.  Primary lesion is not definitively identified on this study.  Small volume on upper abdominal ascites.  Low attenuating liver with an irregular border worrisome for cirrhosis.  Marked cardiomegaly, coronary artery disease.  Aortic atherosclerosis, emphysema.   04/10/2022 Imaging   patient underwent Micra Leadless pacemaker implant    05/10/2022 Imaging    PET scan showed  Hypermetabolic lower neck lymphadenopathy- Left level 2 lymph nodes measure up to 6 mm (2/47), SUV max 1.8. Hypermetabolic left level 5 lymph nodes with index 6 mm lymph node (2/56), SUV max 2.2.  hypermetabolic supraclavicle mediastinal and left hilar adenopathy,Index low right paratracheal lymph node measures 1.3 cm (2/84), SUV max 6.4.  Hypermetabolic bilateral pulmonary nodules.Index spiculated nodule in the apical left upper lobe measures 2.5 x 3.1 cm (2/73) and has enlarged minimally from 03/22/2022 with SUV max 10.4. Scattered hypermetabolic osseous lesions, index lytic lesion in the chest which showed anterior left sixth rib has an SUV max 4.7.     05/10/2022 Imaging   PET scan showed 1. Findings most compatible with stage IV primary bronchogenic carcinoma as evidenced by hypermetabolic bilateral pulmonary nodules, hypermetabolic lymph nodes in the low neck and chest and hypermetabolic bone lesions. 2. Cirrhosis with moderate ascites.3. Small left pleural effusion. 4. Aortic atherosclerosis (ICD10-I70.0). Coronary artery calcification. 5.  Emphysema (ICD10-J43.9).   05/29/2022 Procedure   Biopsy of left supraclavicular adenopathy   05/29/2022 Initial Diagnosis   Primary lung adenocarcinoma (  [name])  05/29/22 Left supraclavicular adenopathy biopsy showed metastatic  adenocarcinoma of lung.    06/06/2022 Cancer Staging   Staging form: Lung, AJCC 8th Edition - Clinical stage from 06/06/2022: Stage IV (cT2, cN3, cM1) - Signed by Kyle Server, MD on 06/06/2022 Stage prefix: Initial diagnosis   06/19/2022 Imaging   MRI brain  Negative for metastatic disease to the brain     Patient has atrial fibrillation and is on anticoagulation with Coumadin.  Former smoker, quitting many years ago.   INTERVAL HISTORY Kyle Gutierrez is a 88 y.o. male who has above history reviewed by me today presents for follow up visit for Stage IV lung cancer treatment.  He reports worse SOB,breathing is more difficult for the past week.  He misunderstood the Rx and took Tramadol 50mg  6 times per day and felt medication may lead to sleeping problems. He is concerned about addiction. He tried a quarter of the 50mg  and feels no difference after taking medication.Currently he feels pain is controlled. He has not needed to take his pain meds recently.  +Abdomen distention.    MEDICAL HISTORY:  Past Medical History:  Diagnosis Date   Benign prostatic hypertrophy    s/p TURP   Cardiomyopathy ([place])    Carotid artery plaque    bilateral   CHF (congestive heart failure) (HCC)    Chronic atrial fibrillation (HCC)    Complication of anesthesia    balance problem   Coronary artery disease, non-occlusive    Dyspnea    Dysrhythmia    atrial fib   Elevated lipids    Gross hematuria    Hypertension    Neuropathy, alcoholic ([place])    per Neurologic eval, remote   Nocturia    Poor balance    renal cell carcinoma 2002   left kidney cancer with partial nephrectomy.   Retinopathy    followed by [facility]   Sleep apnea    Thrombocytopenia, unspecified (HCC)    Urethral stricture    Urinary frequency    Valvular cardiomyopathy (HCC)    severe mitral and tricuspid insufficiency, ECHO July 2012    SURGICAL HISTORY: Past Surgical History:  Procedure Laterality Date   CYSTOSCOPY W/  RETROGRADES N/A 05/06/2013   Procedure: CYSTOSCOPY WITH RETROGRADE PYELOGRAM,  BALLOON DILATION OF URETHRAL STRICTURE;  Surgeon: Dutch Gray, MD;  Location: [facility];  Service: Urology;  Laterality: N/A;   EYE SURGERY     bilateral cataracts removed   left rotator cuff repair     PACEMAKER LEADLESS INSERTION N/A 04/09/2022   Procedure: PACEMAKER LEADLESS INSERTION;  Surgeon: Isaias Cowman, MD;  Location: [place] [facility];  Service: Cardiovascular;  Laterality: N/A;   PARTIAL NEPHRECTOMY Left    10'02   PROSTATE SURGERY     ROTATOR CUFF REPAIR Right    SHOULDER ARTHROSCOPY WITH OPEN ROTATOR CUFF REPAIR Left 07/02/2018   Procedure: SHOULDER ARTHROSCOPY WITH MINI OPEN ROTATOR CUFF REPAIR INCLUDING SUBSCAPULARIS ,SUBACROMINAL DECOMPRESSION.;  Surgeon: Thornton Park, MD;  Location: [facility];  Service: Orthopedics;  Laterality: Left;   TOTAL HIP ARTHROPLASTY Right 06/19/2017   Procedure: TOTAL HIP ARTHROPLASTY ANTERIOR APPROACH;  Surgeon: Hessie Knows, MD;  Location: [facility];  Service: Orthopedics;  Laterality: Right;   TRANSURETHRAL RESECTION OF PROSTATE  2003    SOCIAL HISTORY: Social History   Socioeconomic History   Marital status: Married    Spouse name: Not on file   Number of children: Not on file   Years of education: Not on file  Highest education level: Not on file  Occupational History   Not on file  Tobacco Use   Smoking status: Former    Types: Cigarettes    Quit date: 03/29/1977    Years since quitting: 45.2   Smokeless tobacco: Never  Vaping Use   Vaping Use: Never used  Substance and Sexual Activity   Alcohol use: Yes    Comment: daily 5-6 ounces wine or whiskey    Drug use: No   Sexual activity: Yes  Other Topics Concern   Not on file  Social History Narrative   Not on file   Social Determinants of Health   Financial Resource Strain: Not on file  Food Insecurity: Not on file  Transportation Needs: Not on file  Physical Activity: Not on file   Stress: Not on file  Social Connections: Not on file  Intimate Partner Violence: Not on file    FAMILY HISTORY: Family History  Problem Relation Age of Onset   Heart disease Mother 79       heart failure   Early death Father 93   Heart disease Father        CAD   Cancer Sister        Breast    ALLERGIES:  is allergic to ace inhibitors, latex, tape, and tapentadol.  MEDICATIONS:  Current Outpatient Medications  Medication Sig Dispense Refill   acetaminophen (TYLENOL) 500 MG tablet Take 1,000 mg by mouth 2 (two) times daily.      metoprolol succinate (TOPROL-XL) 25 MG 24 hr tablet Take 12.5 mg by mouth daily.      Multiple Vitamins-Minerals (PRESERVISION AREDS 2 PO) Take 1 capsule by mouth 2 (two) times daily.     simvastatin (ZOCOR) 40 MG tablet Take 1 tablet (40 mg total) by mouth every morning. 90 tablet 2   spironolactone (ALDACTONE) 50 MG tablet Take 50 mg by mouth daily.     torsemide (DEMADEX) 20 MG tablet Take 40 mg by mouth daily.     traMADol (ULTRAM) 50 MG tablet Take 1 tablet (50 mg total) by mouth every 6 (six) hours as needed. (Patient taking differently: Take 25 mg by mouth every 6 (six) hours as needed.) 30 tablet 0   warfarin (COUMADIN) 2 MG tablet Take 2 mg by mouth as directed. Take as directed 3 days a week     warfarin (COUMADIN) 3 MG tablet Take 3 mg by mouth daily. Take 4 days a week     lidocaine (LIDODERM) 5 % Place onto the skin. (Patient not taking: Reported on 06/25/2022)     lidocaine (XYLOCAINE) 5 % ointment Apply 1 Application topically as needed. (Patient not taking: Reported on 06/25/2022) 35.44 g 0   losartan (COZAAR) 100 MG tablet Take 100 mg by mouth daily. (Patient not taking: Reported on 06/11/2022)     Morphine Sulfate (MORPHINE CONCENTRATE) 10 mg / 0.5 ml concentrated solution Take 0.1 mLs (2 mg total) by mouth every 2 (two) hours as needed for severe pain or shortness of breath. 30 mL 0   No current facility-administered medications for  this visit.    Review of Systems  Constitutional:  Negative for fatigue.  HENT:   Positive for hearing loss.   Eyes:  Negative for icterus.  Respiratory:  Positive for shortness of breath. Negative for chest tightness and cough.   Cardiovascular:  Negative for chest pain.  Gastrointestinal:  Positive for abdominal distention. Negative for abdominal pain.  Musculoskeletal:  Negative for back pain.  Skin:  Negative for rash.  Neurological:  Negative for speech difficulty.  Hematological:  Negative for adenopathy.  Psychiatric/Behavioral:  Negative for confusion.      PHYSICAL EXAMINATION: ECOG PERFORMANCE STATUS: 1 - Symptomatic but completely ambulatory  Vitals:   06/25/22 1035  BP: 134/79  Pulse: 98  Temp: 97.8 F (36.6 C)  SpO2: 93%   Filed Weights   06/25/22 1035  Weight: 188 lb (85.3 kg)    Physical Exam Constitutional:      General: He is not in acute distress.    Appearance: He is not diaphoretic.     Comments: Ambulate independently.  HENT:     Head: Normocephalic and atraumatic.     Mouth/Throat:     Pharynx: No oropharyngeal exudate.  Eyes:     General: No scleral icterus.    Pupils: Pupils are equal, round, and reactive to light.  Cardiovascular:     Rate and Rhythm: Normal rate.  Pulmonary:     Effort: Pulmonary effort is normal. No respiratory distress.     Breath sounds: No wheezing.     Comments: Decreased breath sounds bilaterally Abdominal:     General: There is distension.     Palpations: Abdomen is soft.     Tenderness: There is no abdominal tenderness.  Musculoskeletal:        General: No tenderness.     Cervical back: Normal range of motion and neck supple.  Skin:    General: Skin is warm and dry.     Findings: No erythema.  Neurological:     Mental Status: He is alert and oriented to person, place, and time. Mental status is at baseline.     Cranial Nerves: No cranial nerve deficit.     Motor: No abnormal muscle tone.  Psychiatric:         Mood and Affect: Mood and affect normal.      LABORATORY DATA:  I have reviewed the data as listed    Latest Ref Rng & Units 05/17/2022    9:50 AM 12/23/2021    8:21 AM 12/22/2021    2:06 AM  CBC  WBC 4.0 - 10.5 K/uL 7.7  10.3  13.3   Hemoglobin 13.0 - 17.0 g/dL 12.4  12.3  13.1   Hematocrit 39.0 - 52.0 % 39.1  38.7  42.2   Platelets 150 - 400 K/uL 129  82  83       Latest Ref Rng & Units 05/17/2022    9:50 AM 04/10/2022    5:19 AM 12/23/2021    8:21 AM  CMP  Glucose 70 - 99 mg/dL 121  94  112   BUN 8 - 23 mg/dL 43  42  36   Creatinine 0.61 - 1.24 mg/dL 1.38  1.46  1.38   Sodium 135 - 145 mmol/L 140  138  136   Potassium 3.5 - 5.1 mmol/L 4.2  4.6  3.9   Chloride 98 - 111 mmol/L 108  110  110   CO2 22 - 32 mmol/L 25  24  21    Calcium 8.9 - 10.3 mg/dL 9.5  8.8  8.6   Total Protein 6.5 - 8.1 g/dL 6.9     Total Bilirubin 0.3 - 1.2 mg/dL 1.1     Alkaline Phos 38 - 126 U/L 191     AST 15 - 41 U/L 24     ALT 0 - 44 U/L 25        RADIOGRAPHIC STUDIES:  I have personally reviewed the radiological images as listed and agreed with the findings in the report. MR Brain W Wo Contrast  Result Date: 06/21/2022 CLINICAL DATA:  Assess for metastatic disease EXAM: MRI HEAD WITHOUT AND WITH CONTRAST TECHNIQUE: Multiplanar, multiecho pulse sequences of the brain and surrounding structures were obtained without and with intravenous contrast. CONTRAST:  6mL GADAVIST GADOBUTROL 1 MMOL/ML IV SOLN COMPARISON:  Head CT 03/20/2011 FINDINGS: Brain: No enhancement or swelling to suggest metastatic disease. Cerebral volume loss in keeping with age. No significant chronic small vessel ischemic type change. No incidental infarct, hydrocephalus, or collection Vascular: Normal flow voids. Skull and upper cervical spine: Normal marrow signal. Sinuses/Orbits: Negative for mass.  Bilateral cataract resection IMPRESSION: Negative for metastatic disease to the brain. Electronically Signed   By: Jorje Guild  M.D.   On: 06/21/2022 18:37   [facility] RAD RESULTS  Result Date: 06/12/2022 Ordered by an unspecified provider.  US Abdomen Limited  Result Date: 06/07/2022 CLINICAL DATA:  Ascites EXAM: LIMITED ABDOMEN ULTRASOUND FOR ASCITES TECHNIQUE: Limited ultrasound survey for ascites was performed in all four abdominal quadrants. COMPARISON:  PET-CT 05/08/2022 FINDINGS: Trace perihepatic ascites, insufficient for aspiration. IMPRESSION: Trace perihepatic ascites, insufficient for aspiration. Electronically Signed   By: Miachel Roux M.D.   On: 06/07/2022 15:09   [place] CORE BIOPSY (LYMPH NODES)  Result Date: 05/29/2022 INDICATION: 88 year old male with a history suspected metastatic lung carcinoma referred for diagnostic biopsy supraclavicular lymph nodes EXAM: ULTRASOUND-GUIDED BIOPSY LEFT SUPRACLAVICULAR LYMPH NODES MEDICATIONS: None. ANESTHESIA/SEDATION: None COMPLICATIONS: None PROCEDURE: Informed written consent was obtained from the patient after a thorough discussion of the procedural risks, benefits and alternatives. All questions were addressed. Maximal Sterile Barrier Technique was utilized including caps, mask, sterile gowns, sterile gloves, sterile drape, hand hygiene and skin antiseptic. A timeout was performed prior to the initiation of the procedure. Ultrasound survey was performed with images stored and sent to PACs. The left neck was prepped with chlorhexidine in a sterile fashion, and a sterile drape was applied covering the operative field. A sterile gown and sterile gloves were used for the procedure. Local anesthesia was provided with 1% Lidocaine. Ultrasound guidance was used to infiltrate the region with 1% lidocaine for local anesthesia. Multiple 18 gauge core biopsy were then acquired of left supraclavicular pathologic adenopathy using ultrasound guidance. Images were stored. Specimen placed into fresh specimen for transport to the lab. Final image was stored after biopsy. Patient tolerated the  procedure well and remained hemodynamically stable throughout. No complications were encountered and no significant blood loss was encounter IMPRESSION: Status post ultrasound-guided biopsy pathologic left supraclavicular adenopathy Signed, Dulcy Fanny. Nadene Rubins, RPVI [facility] Electronically Signed   By: Corrie Mckusick D.O.   On: 05/29/2022 14:16

## 2022-06-26 ENCOUNTER — Encounter: Payer: Medicare Other | Admitting: Hospice and Palliative Medicine

## 2022-06-28 ENCOUNTER — Encounter: Payer: Self-pay | Admitting: Oncology

## 2022-07-02 ENCOUNTER — Other Ambulatory Visit: Payer: Self-pay | Admitting: *Deleted

## 2022-07-02 MED ORDER — MORPHINE SULFATE (CONCENTRATE) 10 MG /0.5 ML PO SOLN: 5.0000 mg | ORAL | 0 refills | Status: AC | PRN

## 2022-07-02 MED ORDER — MORPHINE SULFATE (CONCENTRATE) 10 MG /0.5 ML PO SOLN: 2.0000 mg | ORAL | 0 refills | Status: DC | PRN

## 2022-07-03 ENCOUNTER — Encounter: Payer: Self-pay | Admitting: *Deleted

## 2022-07-23 ENCOUNTER — Telehealth: Payer: Self-pay | Admitting: *Deleted

## 2022-07-23 NOTE — Telephone Encounter (Signed)
Message received from pt's son that pt passed away on 2022-07-14 and requested that all appts be cancelled at this time. Chart updated.

## 2022-08-02 DEATH — deceased

## 2022-09-16 ENCOUNTER — Ambulatory Visit: Payer: Medicare Other | Admitting: Urology
